# Patient Record
Sex: Female | Born: 1935 | Race: White | Hispanic: No | Smoking: Former smoker
Health system: Southern US, Community
[De-identification: ages and names within clinical notes are randomized; demographics above are authoritative.]

## PROBLEM LIST (undated history)

## (undated) DIAGNOSIS — J449 Chronic obstructive pulmonary disease, unspecified: Secondary | ICD-10-CM

## (undated) DIAGNOSIS — H409 Unspecified glaucoma: Secondary | ICD-10-CM

## (undated) DIAGNOSIS — R002 Palpitations: Secondary | ICD-10-CM

## (undated) DIAGNOSIS — K629 Disease of anus and rectum, unspecified: Secondary | ICD-10-CM

## (undated) DIAGNOSIS — I10 Essential (primary) hypertension: Secondary | ICD-10-CM

## (undated) DIAGNOSIS — N3941 Urge incontinence: Secondary | ICD-10-CM

## (undated) DIAGNOSIS — E039 Hypothyroidism, unspecified: Secondary | ICD-10-CM

## (undated) DIAGNOSIS — R7303 Prediabetes: Secondary | ICD-10-CM

## (undated) DIAGNOSIS — R0902 Hypoxemia: Secondary | ICD-10-CM

## (undated) HISTORY — DX: Essential (primary) hypertension: I10

## (undated) HISTORY — DX: Prediabetes: R73.03

## (undated) HISTORY — DX: Hypoxemia: R09.02

## (undated) HISTORY — PX: ABDOMINAL HYSTERECTOMY: SHX81

## (undated) HISTORY — DX: Disease of anus and rectum, unspecified: K62.9

## (undated) HISTORY — DX: Palpitations: R00.2

## (undated) HISTORY — DX: Urge incontinence: N39.41

---

## 2011-04-11 HISTORY — PX: COLONOSCOPY: SHX174

## 2015-02-18 DIAGNOSIS — H409 Unspecified glaucoma: Secondary | ICD-10-CM | POA: Insufficient documentation

## 2015-02-19 DIAGNOSIS — R7303 Prediabetes: Secondary | ICD-10-CM

## 2015-02-19 HISTORY — DX: Prediabetes: R73.03

## 2015-02-22 DIAGNOSIS — F17200 Nicotine dependence, unspecified, uncomplicated: Secondary | ICD-10-CM | POA: Insufficient documentation

## 2015-02-22 DIAGNOSIS — Z2821 Immunization not carried out because of patient refusal: Secondary | ICD-10-CM | POA: Insufficient documentation

## 2015-10-06 DIAGNOSIS — Z7189 Other specified counseling: Secondary | ICD-10-CM | POA: Insufficient documentation

## 2015-10-06 DIAGNOSIS — Z13820 Encounter for screening for osteoporosis: Secondary | ICD-10-CM | POA: Insufficient documentation

## 2016-02-07 ENCOUNTER — Inpatient Hospital Stay
Admission: EM | Admit: 2016-02-07 | Discharge: 2016-02-08 | DRG: 192 | Disposition: A | Discharge status: Home Health | Payer: Medicare HMO | Attending: Internal Medicine | Admitting: Internal Medicine

## 2016-02-07 ENCOUNTER — Emergency Department: Payer: Medicare HMO

## 2016-02-07 ENCOUNTER — Encounter: Payer: Self-pay | Admitting: Emergency Medicine

## 2016-02-07 DIAGNOSIS — M6281 Muscle weakness (generalized): Secondary | ICD-10-CM

## 2016-02-07 DIAGNOSIS — Z7982 Long term (current) use of aspirin: Secondary | ICD-10-CM

## 2016-02-07 DIAGNOSIS — J441 Chronic obstructive pulmonary disease with (acute) exacerbation: Secondary | ICD-10-CM | POA: Diagnosis not present

## 2016-02-07 DIAGNOSIS — Z87891 Personal history of nicotine dependence: Secondary | ICD-10-CM

## 2016-02-07 DIAGNOSIS — Z79899 Other long term (current) drug therapy: Secondary | ICD-10-CM | POA: Diagnosis not present

## 2016-02-07 DIAGNOSIS — R0902 Hypoxemia: Secondary | ICD-10-CM | POA: Diagnosis present

## 2016-02-07 DIAGNOSIS — R2681 Unsteadiness on feet: Secondary | ICD-10-CM

## 2016-02-07 DIAGNOSIS — I10 Essential (primary) hypertension: Secondary | ICD-10-CM | POA: Diagnosis present

## 2016-02-07 DIAGNOSIS — H409 Unspecified glaucoma: Secondary | ICD-10-CM | POA: Diagnosis present

## 2016-02-07 DIAGNOSIS — E039 Hypothyroidism, unspecified: Secondary | ICD-10-CM | POA: Diagnosis present

## 2016-02-07 HISTORY — DX: Unspecified glaucoma: H40.9

## 2016-02-07 HISTORY — DX: Hypothyroidism, unspecified: E03.9

## 2016-02-07 HISTORY — DX: Essential (primary) hypertension: I10

## 2016-02-07 HISTORY — DX: Chronic obstructive pulmonary disease, unspecified: J44.9

## 2016-02-07 LAB — CBC WITH DIFFERENTIAL/PLATELET
BASOS ABS: 0 10*3/uL (ref 0–0.1)
BASOS PCT: 0 %
EOS ABS: 0 10*3/uL (ref 0–0.7)
EOS PCT: 0 %
HCT: 41.8 % (ref 35.0–47.0)
Hemoglobin: 13.8 g/dL (ref 12.0–16.0)
LYMPHS PCT: 5 %
Lymphs Abs: 0.9 10*3/uL — ABNORMAL LOW (ref 1.0–3.6)
MCH: 31.9 pg (ref 26.0–34.0)
MCHC: 32.9 g/dL (ref 32.0–36.0)
MCV: 96.8 fL (ref 80.0–100.0)
MONO ABS: 1.6 10*3/uL — AB (ref 0.2–0.9)
Monocytes Relative: 9 %
Neutro Abs: 16.3 10*3/uL — ABNORMAL HIGH (ref 1.4–6.5)
Neutrophils Relative %: 86 %
PLATELETS: 256 10*3/uL (ref 150–440)
RBC: 4.32 MIL/uL (ref 3.80–5.20)
RDW: 12.4 % (ref 11.5–14.5)
WBC: 18.9 10*3/uL — AB (ref 3.6–11.0)

## 2016-02-07 LAB — BASIC METABOLIC PANEL
ANION GAP: 10 (ref 5–15)
BUN: 10 mg/dL (ref 6–20)
CALCIUM: 9.4 mg/dL (ref 8.9–10.3)
CO2: 31 mmol/L (ref 22–32)
Chloride: 89 mmol/L — ABNORMAL LOW (ref 101–111)
Creatinine, Ser: 0.51 mg/dL (ref 0.44–1.00)
GLUCOSE: 115 mg/dL — AB (ref 65–99)
POTASSIUM: 3.4 mmol/L — AB (ref 3.5–5.1)
SODIUM: 130 mmol/L — AB (ref 135–145)

## 2016-02-07 MED ORDER — IPRATROPIUM BROMIDE 0.02 % IN SOLN
0.5000 mg | Freq: Once | RESPIRATORY_TRACT | Status: AC
  Administered 2016-02-07: 0.5 mg via RESPIRATORY_TRACT
  Filled 2016-02-07: qty 2.5

## 2016-02-07 MED ORDER — ACETAMINOPHEN 650 MG RE SUPP: 650.0000 mg | Freq: Four times a day (QID) | RECTAL | Status: DC | PRN

## 2016-02-07 MED ORDER — DEXTROSE 5 % IV SOLN
500.0000 mg | Freq: Once | INTRAVENOUS | Status: AC
  Administered 2016-02-07: 500 mg via INTRAVENOUS
  Filled 2016-02-07: qty 500

## 2016-02-07 MED ORDER — IPRATROPIUM-ALBUTEROL 0.5-2.5 (3) MG/3ML IN SOLN
3.0000 mL | Freq: Four times a day (QID) | RESPIRATORY_TRACT | Status: DC
  Administered 2016-02-07 – 2016-02-08 (×3): 3 mL via RESPIRATORY_TRACT
  Filled 2016-02-07 (×4): qty 3

## 2016-02-07 MED ORDER — CEFTRIAXONE SODIUM-DEXTROSE 1-3.74 GM-% IV SOLR: 1.0000 g | Freq: Every day | INTRAVENOUS | Status: DC

## 2016-02-07 MED ORDER — CEFTRIAXONE SODIUM 1 G IJ SOLR: 1.0000 g | INTRAMUSCULAR | Status: DC

## 2016-02-07 MED ORDER — LOSARTAN POTASSIUM 25 MG PO TABS
50.0000 mg | ORAL_TABLET | Freq: Every day | ORAL | Status: DC
  Administered 2016-02-08: 50 mg via ORAL
  Filled 2016-02-07: qty 1
  Filled 2016-02-07: qty 2

## 2016-02-07 MED ORDER — ACETAMINOPHEN 325 MG PO TABS: 650.0000 mg | ORAL_TABLET | Freq: Four times a day (QID) | ORAL | Status: DC | PRN

## 2016-02-07 MED ORDER — ASPIRIN EC 81 MG PO TBEC
81.0000 mg | DELAYED_RELEASE_TABLET | Freq: Every day | ORAL | Status: DC
  Administered 2016-02-08: 81 mg via ORAL
  Filled 2016-02-07: qty 1

## 2016-02-07 MED ORDER — METHYLPREDNISOLONE SODIUM SUCC 125 MG IJ SOLR
60.0000 mg | Freq: Four times a day (QID) | INTRAMUSCULAR | Status: DC
  Administered 2016-02-08 (×2): 60 mg via INTRAVENOUS
  Filled 2016-02-07 (×2): qty 2

## 2016-02-07 MED ORDER — ORAL CARE MOUTH RINSE: 15.0000 mL | Freq: Two times a day (BID) | OROMUCOSAL | Status: DC

## 2016-02-07 MED ORDER — TIMOLOL MALEATE 0.5 % OP SOLN
1.0000 [drp] | Freq: Two times a day (BID) | OPHTHALMIC | Status: DC
  Administered 2016-02-08: 09:00:00 1 [drp] via OPHTHALMIC
  Filled 2016-02-07 (×2): qty 5

## 2016-02-07 MED ORDER — LEVOTHYROXINE SODIUM 50 MCG PO TABS
100.0000 ug | ORAL_TABLET | Freq: Every day | ORAL | Status: DC
  Administered 2016-02-08: 100 ug via ORAL
  Filled 2016-02-07: qty 2

## 2016-02-07 MED ORDER — BUDESONIDE 0.25 MG/2ML IN SUSP
0.2500 mg | Freq: Two times a day (BID) | RESPIRATORY_TRACT | Status: DC
  Administered 2016-02-07 – 2016-02-08 (×2): 0.25 mg via RESPIRATORY_TRACT
  Filled 2016-02-07 (×2): qty 2

## 2016-02-07 MED ORDER — ENOXAPARIN SODIUM 40 MG/0.4ML ~~LOC~~ SOLN: 30.0000 mg | SUBCUTANEOUS | Status: DC

## 2016-02-07 MED ORDER — LATANOPROST 0.005 % OP SOLN
1.0000 [drp] | Freq: Every day | OPHTHALMIC | Status: DC
  Filled 2016-02-07 (×3): qty 2.5

## 2016-02-07 MED ORDER — ALBUTEROL SULFATE (2.5 MG/3ML) 0.083% IN NEBU
5.0000 mg | INHALATION_SOLUTION | Freq: Once | RESPIRATORY_TRACT | Status: AC
  Administered 2016-02-07: 5 mg via RESPIRATORY_TRACT
  Filled 2016-02-07: qty 6

## 2016-02-07 MED ORDER — AZITHROMYCIN 250 MG PO TABS: 250.0000 mg | ORAL_TABLET | Freq: Every day | ORAL | Status: DC

## 2016-02-07 MED ORDER — CEFTRIAXONE SODIUM-DEXTROSE 1-3.74 GM-% IV SOLR: 1.0000 g | INTRAVENOUS | Status: DC

## 2016-02-07 MED ORDER — METHYLPREDNISOLONE SODIUM SUCC 125 MG IJ SOLR
125.0000 mg | Freq: Once | INTRAMUSCULAR | Status: AC
  Administered 2016-02-07: 125 mg via INTRAVENOUS
  Filled 2016-02-07: qty 2

## 2016-02-07 NOTE — ED Triage Notes (Signed)
C/O one week history of productive cough and fever.  Seen by Dr. Valora Piccolo who recommended her to be seen through the ED for evaluation and possible admission.

## 2016-02-07 NOTE — Progress Notes (Signed)
Pt has an order for lovenox '40mg'$  q 24hr. Pt is a female whose weight is <47kg. Per protocol, will reduce dose to '30mg'$  daily.  Ramond Dial, Pharm.D Clinical Pharmacist

## 2016-02-07 NOTE — ED Provider Notes (Addendum)
[facility] Emergency Department Provider Note  ____________________________________________   I have reviewed the triage vital signs and the nursing notes.   HISTORY  Chief Complaint Cough and Fever    HPI Jane Davis is a 82 y.o. female chronic smoker, history of COPD, with home albuterol not on home oxygen presents today with cough and wheeze for a few days. She states that she has had a subjective fever at home. She denies nausea or vomiting or chest pain. She is getting short of breath walking around.    Past Medical History:  Diagnosis Date  . COPD (chronic obstructive pulmonary disease) (HCC)     There are no active problems to display for this patient.   Past Surgical History:  Procedure Laterality Date  . ABDOMINAL HYSTERECTOMY      Prior to Admission medications   Medication Sig Start Date End Date Taking? Authorizing Provider  albuterol (ACCUNEB) 1.25 MG/3ML nebulizer solution Take 1.25 mg by nebulization every 6 (six) hours as needed. 02/22/15  Yes Historical Provider, MD  aspirin EC 81 MG tablet Take 81 mg by mouth daily.   Yes Historical Provider, MD  latanoprost (XALATAN) 0.005 % ophthalmic solution Place 1 drop into both eyes at bedtime.    Yes Historical Provider, MD  levothyroxine (SYNTHROID, LEVOTHROID) 100 MCG tablet Take 1 tablet by mouth daily. 06/21/15 06/20/16 Yes Historical Provider, MD  losartan (COZAAR) 50 MG tablet Take 50 mg by mouth daily. 03/08/15 03/07/16 Yes Historical Provider, MD  Timolol Maleate 0.5 % (DAILY) SOLN Place 1 drop into the right eye daily.   Yes Historical Provider, MD    Allergies Review of patient's allergies indicates no known allergies.  No family history on file.  Social History Social History  Substance Use Topics  . Smoking status: Former Research scientist (life sciences)  . Smokeless tobacco: Never Used  . Alcohol use No    Review of Systems Constitutional: No fever/chills Eyes: No visual changes. ENT: No  sore throat. No stiff neck no neck pain Cardiovascular: Denies chest pain. Respiratory: Positive shortness of breath. Gastrointestinal:   no vomiting.  No diarrhea.  No constipation. Genitourinary: Negative for dysuria. Musculoskeletal: Negative lower extremity swelling Skin: Negative for rash. Neurological: Negative for severe headaches, focal weakness or numbness. 10-point ROS otherwise negative.  ____________________________________________   PHYSICAL EXAM:  VITAL SIGNS: ED Triage Vitals  Enc Vitals Group     BP 02/07/16 1139 (!) 139/59     Pulse Rate 02/07/16 1139 92     Resp 02/07/16 1139 15     Temp 02/07/16 1139 98 F (36.7 C)     Temp Source 02/07/16 1139 Oral     SpO2 02/07/16 1139 96 %     Weight 02/07/16 1139 92 lb (41.7 kg)     Height 02/07/16 1139 5' (1.524 m)     Head Circumference --      Peak Flow --      Pain Score 02/07/16 1146 0     Pain Loc --      Pain Edu? --      Excl. in [place]? --     Constitutional: Alert and oriented. Well appearing and in no acute distress. Eyes: Conjunctivae are normal. PERRL. EOMI. Head: Atraumatic. Nose: No congestion/rhinnorhea. Mouth/Throat: Mucous membranes are moist.  Oropharynx non-erythematous. Neck: No stridor.   Nontender with no meningismus Cardiovascular: Normal rate, regular rhythm. Grossly normal heart sounds.  Good peripheral circulation. Respiratory: Diffuse rhonchi clear mostly with cough but still persist wheeze  noted as well diminished in the bases Abdominal: Soft and nontender. No distention. No guarding no rebound Back:  There is no focal tenderness or step off.  there is no midline tenderness there are no lesions noted. there is no CVA tenderness Musculoskeletal: No lower extremity tenderness, no upper extremity tenderness. No joint effusions, no DVT signs strong distal pulses no edema Neurologic:  Normal speech and language. No gross focal neurologic deficits are appreciated.  Skin:  Skin is warm, dry and  intact. No rash noted. Psychiatric: Mood and affect are normal. Speech and behavior are normal.  ____________________________________________   LABS (all labs ordered are listed, but only abnormal results are displayed)  Labs Reviewed  CBC WITH DIFFERENTIAL/PLATELET - Abnormal; Notable for the following:       Result Value   WBC 18.9 (*)    Neutro Abs 16.3 (*)    Lymphs Abs 0.9 (*)    Monocytes Absolute 1.6 (*)    All other components within normal limits  BASIC METABOLIC PANEL - Abnormal; Notable for the following:    Sodium 130 (*)    Potassium 3.4 (*)    Chloride 89 (*)    Glucose, Bld 115 (*)    All other components within normal limits   ____________________________________________  EKG  I personally interpreted any EKGs ordered by me or triage Normal sinus rhythm rate 90 bpm no acute ST elevation or depression normal axis rsr' configuration ____________________________________________  RADIOLOGY  I reviewed any imaging ordered by me or triage that were performed during my shift and, if possible, patient and/or family made aware of any abnormal findings. ____________________________________________   PROCEDURES  Procedure(s) performed: None  Procedures  Critical Care performed: None  ____________________________________________   INITIAL IMPRESSION / ASSESSMENT AND PLAN / ED COURSE  Pertinent labs & imaging results that were available during my care of the patient were reviewed by me and considered in my medical decision making (see chart for details).  Patient with COPD presents with cough and wheeze. White count is elevated. On room air, she does get down to 86% in the room even after nebulizer treatment. We will start her on oxygen here, patient will require admission. No clear evidence of pneumonia but we did give her empiric antibiotics hoping to get her home, we will admit to the hospice or so because of her hypoxia.  Clinical Course    ____________________________________________   FINAL CLINICAL IMPRESSION(S) / ED DIAGNOSES  Final diagnoses:  None      This chart was dictated using voice recognition software.  Despite best efforts to proofread,  errors can occur which can change meaning.      Schuyler Amor, MD 02/07/16 1722    Schuyler Amor, MD 02/07/16 [phone]

## 2016-02-07 NOTE — ED Notes (Signed)
Pt presents with cough x 5 days. Pt states she is coughing up green phlegm most of that time. Denies pain. Pt coughing during assessment. NAD Noted.

## 2016-02-07 NOTE — ED Notes (Signed)
Pt placed on 2L [place] due to 02 of 87%. MD made aware and 02 up to 95% on 2L

## 2016-02-07 NOTE — H&P (Signed)
Man at [place] NAME: Jane Davis    MR#:  [id number]  DATE OF BIRTH:  [birth year]  DATE OF ADMISSION:  02/07/2016  PRIMARY CARE PHYSICIAN: Lilian Coma., MD   REQUESTING/REFERRING PHYSICIAN: Dr Charlotte Crumb  CHIEF COMPLAINT:   Chief Complaint  Patient presents with  . Cough  . Fever    HISTORY OF PRESENT ILLNESS:  Jane Davis  is a 82 y.o. female sent in for difficulty breathing and cough. Patient states that she feels better once the oxygen was placed. She's been feeling really fatigued. She's been coughing up green and yellow sputum. In the ER, she was found to have a pulse ox of 87% on room air and was placed on oxygen. Hospitalist services were contacted for further evaluation.  PAST MEDICAL HISTORY:   Past Medical History:  Diagnosis Date  . COPD (chronic obstructive pulmonary disease) ([place])   . Glaucoma   . Hypertension   . Hypothyroidism     PAST SURGICAL HISTORY:   Past Surgical History:  Procedure Laterality Date  . ABDOMINAL HYSTERECTOMY      SOCIAL HISTORY:   Social History  Substance Use Topics  . Smoking status: Former Research scientist (life sciences)  . Smokeless tobacco: Never Used  . Alcohol use No    FAMILY HISTORY:   Family History  Problem Relation Age of Onset  . Intracerebral hemorrhage Mother     DRUG ALLERGIES:  No Known Allergies  REVIEW OF SYSTEMS:  CONSTITUTIONAL: Positive for slight fever, positive for chills. Positive for fatigue. Positive for weight loss EYES: No blurred or double vision. Wears glasses.  EARS, NOSE, AND THROAT: No tinnitus or ear pain. No sore throat RESPIRATORY: Positive for cough with green-yellow phlegm. Positive for shortness of breath and wheezing. No hemoptysis.  CARDIOVASCULAR: No chest pain, orthopnea, edema.  GASTROINTESTINAL: No nausea, vomiting, diarrhea or abdominal pain. No blood in bowel movements GENITOURINARY: No dysuria, hematuria.  ENDOCRINE: No  polyuria, nocturia,  HEMATOLOGY: No anemia, easy bruising or bleeding SKIN: No rash or lesion. MUSCULOSKELETAL: Positive for joint pains  NEUROLOGIC: No tingling, numbness, weakness.  PSYCHIATRY: No anxiety or depression.   MEDICATIONS AT HOME:   Prior to Admission medications   Medication Sig Start Date End Date Taking? Authorizing Provider  albuterol (ACCUNEB) 1.25 MG/3ML nebulizer solution Take 1.25 mg by nebulization every 6 (six) hours as needed. 02/22/15  Yes Historical Provider, MD  aspirin EC 81 MG tablet Take 81 mg by mouth daily.   Yes Historical Provider, MD  latanoprost (XALATAN) 0.005 % ophthalmic solution Place 1 drop into both eyes at bedtime.    Yes Historical Provider, MD  levothyroxine (SYNTHROID, LEVOTHROID) 100 MCG tablet Take 1 tablet by mouth daily. 06/21/15 06/20/16 Yes Historical Provider, MD  losartan (COZAAR) 50 MG tablet Take 50 mg by mouth daily. 03/08/15 03/07/16 Yes Historical Provider, MD  Timolol Maleate 0.5 % (DAILY) SOLN Place 1 drop into the right eye daily.   Yes Historical Provider, MD      VITAL SIGNS:  Blood pressure (!) 127/52, pulse 99, temperature 98 F (36.7 C), temperature source Oral, resp. rate (!) 26, height 5' (1.524 m), weight 41.7 kg (92 lb), SpO2 95 %.  PHYSICAL EXAMINATION:  GENERAL:  82 y.o.-year-old patient lying in the bed with no acute distress.  EYES: Pupils equal, round, reactive to light and accommodation. No scleral icterus. Extraocular muscles intact.  HEENT: Head atraumatic, normocephalic. Oropharynx and nasopharynx clear.  NECK:  Supple, no jugular  venous distention. No thyroid enlargement, no tenderness.  LUNGS: Decreased breath sounds bilaterally, positive expiratory upper airway wheezing. Positive for rhonchi right base. No use of accessory muscles of respiration.  CARDIOVASCULAR: S1, S2 normal. 2/6 systolic murmurs. No rubs, or gallops.  ABDOMEN: Soft, nontender, nondistended. Bowel sounds present. No organomegaly or  mass.  EXTREMITIES: No pedal edema, cyanosis, or clubbing.  NEUROLOGIC: Cranial nerves II through XII are intact. Muscle strength 5/5 in all extremities. Sensation intact. Gait not checked.  PSYCHIATRIC: The patient is alert and oriented x 3.  SKIN: No rash, lesion, or ulcer.   LABORATORY PANEL:   CBC  Recent Labs Lab 02/07/16 1152  WBC 18.9*  HGB 13.8  HCT 41.8  PLT 256   ------------------------------------------------------------------------------------------------------------------  Chemistries   Recent Labs Lab 02/07/16 1152  NA 130*  K 3.4*  CL 89*  CO2 31  GLUCOSE 115*  BUN 10  CREATININE 0.51  CALCIUM 9.4   ------------------------------------------------------------------------------------------------------------------    RADIOLOGY:  Dg Chest 2 View  Result Date: 02/07/2016 CLINICAL DATA:  Productive cough and fever EXAM: CHEST  2 VIEW COMPARISON:  None. FINDINGS: Cardiac shadow is within normal limits. The lungs are hyperaerated consistent with COPD. Biapical scarring is seen. Scattered calcified granulomas are noted bilaterally. Degenerative change of the thoracic spine is seen. No focal confluent infiltrate is noted. IMPRESSION: Chronic changes as described Electronically Signed   By: Inez Catalina M.D.   On: 02/07/2016 12:19    EKG:   Sinus rhythm 98 bpm right axis deviation  IMPRESSION AND PLAN:   1. COPD exacerbation. Start nebulizer treatments with budesonide and DuoNeb. Antibiotics with Rocephin and Zithromax. Start systemic steroids with Solu-Medrol 60 mg every 6 hours. Initial chest x-ray was read as negative but I do suspect pneumonia. Sputum culture. 2. Acute respiratory failure with hypoxia. Oxygen supplementation. 3. Essential hypertension continue losartan 4. Hypothyroidism unspecified continue Synthroid 5. Glaucoma unspecified continue eyedrops    All the records are reviewed and case discussed with ED provider. Management plans  discussed with the patient, and she is in agreement.  CODE STATUS: Full code  TOTAL TIME TAKING CARE OF THIS PATIENT: 50 minutes.    Loletha Grayer M.D on 02/07/2016 at 6:14 PM  Between 7am to 6pm - Pager - [phone]  After 6pm call admission pager [phone]  [facility]  [phone]  CC: Primary care physician; Lilian Coma., MD

## 2016-02-08 LAB — BASIC METABOLIC PANEL
ANION GAP: 9 (ref 5–15)
BUN: 15 mg/dL (ref 6–20)
CALCIUM: 9 mg/dL (ref 8.9–10.3)
CHLORIDE: 94 mmol/L — AB (ref 101–111)
CO2: 31 mmol/L (ref 22–32)
Creatinine, Ser: 0.55 mg/dL (ref 0.44–1.00)
GFR calc Af Amer: >60 mL/min (ref >60)
GFR calc non Af Amer: >60 mL/min (ref >60)
GLUCOSE: 233 mg/dL — AB (ref 65–99)
Potassium: 3.8 mmol/L (ref 3.5–5.1)
Sodium: 134 mmol/L — ABNORMAL LOW (ref 135–145)

## 2016-02-08 LAB — CBC
HEMATOCRIT: 39.3 % (ref 35.0–47.0)
HEMOGLOBIN: 13.2 g/dL (ref 12.0–16.0)
MCH: 32.2 pg (ref 26.0–34.0)
MCHC: 33.5 g/dL (ref 32.0–36.0)
MCV: 96 fL (ref 80.0–100.0)
Platelets: 243 10*3/uL (ref 150–440)
RBC: 4.09 MIL/uL (ref 3.80–5.20)
RDW: 12.1 % (ref 11.5–14.5)
WBC: 14.8 10*3/uL — ABNORMAL HIGH (ref 3.6–11.0)

## 2016-02-08 MED ORDER — LEVOFLOXACIN 500 MG PO TABS: 250.0000 mg | ORAL_TABLET | Freq: Every day | ORAL | Status: DC

## 2016-02-08 MED ORDER — AZITHROMYCIN 250 MG PO TABS: ORAL_TABLET | ORAL | 0 refills | Status: DC

## 2016-02-08 MED ORDER — PREDNISONE 50 MG PO TABS: 50.0000 mg | ORAL_TABLET | Freq: Every day | ORAL | Status: DC

## 2016-02-08 MED ORDER — LEVOFLOXACIN 500 MG PO TABS: 500.0000 mg | ORAL_TABLET | Freq: Every day | ORAL | Status: DC

## 2016-02-08 MED ORDER — LEVOFLOXACIN 250 MG PO TABS: 250.0000 mg | ORAL_TABLET | Freq: Every day | ORAL | 0 refills | Status: DC

## 2016-02-08 MED ORDER — METHYLPREDNISOLONE SODIUM SUCC 125 MG IJ SOLR: 60.0000 mg | Freq: Every day | INTRAMUSCULAR | Status: DC

## 2016-02-08 MED ORDER — LEVOFLOXACIN 500 MG PO TABS
500.0000 mg | ORAL_TABLET | Freq: Once | ORAL | Status: AC
  Administered 2016-02-08: 16:00:00 500 mg via ORAL
  Filled 2016-02-08: qty 1

## 2016-02-08 MED ORDER — LEVOFLOXACIN 500 MG PO TABS: 500.0000 mg | ORAL_TABLET | Freq: Every day | ORAL | 0 refills | Status: DC

## 2016-02-08 MED ORDER — PREDNISONE 10 MG PO TABS: ORAL_TABLET | ORAL | 0 refills | Status: DC

## 2016-02-08 NOTE — Evaluation (Signed)
Physical Therapy Evaluation Patient Details Name: Jane Davis MRN: [id number] DOB: 08-14-1933 Today's Date: 02/08/2016   History of Present Illness  Pt admitted for complaints of cough and fever. Pt with history of COPD, glaucoma, and HTN. Pt now admitted for COPD exacerbation.  Clinical Impression  Pt is a pleasant 82 year old female who was admitted for COPD exacerbation. Pt demonstrates all bed mobility/transfers/ambulation at baseline level. Pt performed all mobility on RA, however sats decreasing to 87%. No SOB symptoms noted. 2L of O2 reapplied with sats improving to 92%. CM notified. Pt encouraged to continue to be active to improve endurance. Encouraged to continue ambulation with RN staff. Pt does not require any further PT needs at this time. Pt will be dc in house and does not require follow up. RN aware. Will dc current orders.      Follow Up Recommendations No PT follow up    Equipment Recommendations  None recommended by PT    Recommendations for Other Services       Precautions / Restrictions Precautions Precautions: Fall Restrictions Weight Bearing Restrictions: No      Mobility  Bed Mobility Overal bed mobility: Independent             General bed mobility comments: safe technique performed, no assistance needed  Transfers Overall transfer level: Independent Equipment used: None             General transfer comment: safe technique performed  Ambulation/Gait Ambulation/Gait assistance: Supervision Ambulation Distance (Feet): 20 Feet Assistive device: None       General Gait Details: slight unsteadiness noted initially, however improves with increased ambulation.  Small shuffling gait pattern noted with 1 reaching for wall. Further ambulation peformed in ther-ex  Stairs            Wheelchair Mobility    Modified Rankin (Stroke Patients Only)       Balance Overall balance assessment: Independent                                            Pertinent Vitals/Pain Pain Assessment: No/denies pain    Home Living Family/patient expects to be discharged to:: Private residence Living Arrangements: Children Available Help at Discharge: Family;Available PRN/intermittently Type of Home: House Home Access: Level entry     Home Layout: One level Home Equipment: None      Prior Function Level of Independence: Independent         Comments: community ambulator     Journalist, newspaper        Extremity/Trunk Assessment   Upper Extremity Assessment: Overall WFL for tasks assessed           Lower Extremity Assessment: Overall WFL for tasks assessed         Communication   Communication: No difficulties  Cognition Arousal/Alertness: Awake/alert Behavior During Therapy: WFL for tasks assessed/performed Overall Cognitive Status: Within Functional Limits for tasks assessed                      General Comments      Exercises Other Exercises Other Exercises: Further ambulation performed for 170' with no AD and supervision. Safe technique performed with reciprocal gait pattern and no AD. No fatigue noted. No LOB noted   Assessment/Plan    PT Assessment Patent does not need any further PT services  PT Problem List  PT Treatment Interventions      PT Goals (Current goals can be found in the Care Plan section)  Acute Rehab PT Goals Patient Stated Goal: to go home PT Goal Formulation: All assessment and education complete, DC therapy Time For Goal Achievement: 03/02/16 Potential to Achieve Goals: Good    Frequency     Barriers to discharge        Co-evaluation               End of Session Equipment Utilized During Treatment: Gait belt Activity Tolerance: Patient tolerated treatment well Patient left: in bed (sitting at EOB, pt is mod fall risk) Nurse Communication: Mobility status         Time: 1016-1030 PT Time Calculation (min) (ACUTE  ONLY): 14 min   Charges:   PT Evaluation $PT Eval Low Complexity: 1 Procedure PT Treatments $Gait Training: 8-22 mins   PT G Codes:        Mayerly Kaman 03/02/2016, 12:03 PM Greggory Stallion, PT, DPT [phone]

## 2016-02-08 NOTE — Progress Notes (Signed)
SATURATION QUALIFICATIONS: (This note is used to comply with regulatory documentation for home oxygen)  Patient Saturations on Room Air at Rest = 90%  Patient Saturations on Room Air while Ambulating =87%  Patient Saturations on 2L Liters of oxygen while Ambulating = 92%  Please briefly explain why patient needs home oxygen: Chronic COPD and COPD exacerbation

## 2016-02-08 NOTE — Discharge Instructions (Signed)
Use your oxygen as instructed. °

## 2016-02-08 NOTE — Discharge Summary (Signed)
Jane Davis at [place] NAME: Jane Davis    MR#:  [id number]  DATE OF BIRTH:  [birth year]  DATE OF ADMISSION:  02/07/2016 ADMITTING PHYSICIAN: Loletha Grayer, MD  DATE OF DISCHARGE: 02/08/16  PRIMARY CARE PHYSICIAN: Lilian Coma., MD    ADMISSION DIAGNOSIS:  Chronic obstructive pulmonary disease with acute exacerbation (HCC) [J44.1]  DISCHARGE DIAGNOSIS:  Acute on chronic respiratory failure now needing oxygen HTN  SECONDARY DIAGNOSIS:   Past Medical History:  Diagnosis Date  . COPD (chronic obstructive pulmonary disease) ([place])   . Glaucoma   . Hypertension   . Hypothyroidism     HOSPITAL COURSE:  Jane Davis  is a 82 y.o. female sent in for difficulty breathing and cough. Patient states that she feels better once the oxygen was placed. She's been feeling really fatigued. She's been coughing up green and yellow sputum. In the ER, she was found to have a pulse ox of 87% on room air and was placed on oxygen.   1. COPD exacerbation. -on  nebulizer treatments with budesonide and DuoNeb. Antibiotics with  levaquin -switch to po steroids   Initial chest x-ray was read as negative  Wbc down to 14 k Pt says she is fine going home. Feels better than yday  2. Acute respiratory failure with hypoxia. Oxygen supplementation. -set up home oxygen  3. Essential hypertension continue losartan  4. Hypothyroidism unspecified continue Synthroid  5. Glaucoma unspecified continue eyedrops   CONSULTS OBTAINED:    DRUG ALLERGIES:  No Known Allergies  DISCHARGE MEDICATIONS:   Current Discharge Medication List    START taking these medications   Details  levofloxacin (LEVAQUIN) 250 MG tablet Take 1 tablet (250 mg total) by mouth daily. Qty: 6 tablet, Refills: 0    predniSONE (DELTASONE) 10 MG tablet Start 50 mg daily---taper by 10 mg daily then stop Qty: 15 tablet, Refills: 0      CONTINUE these medications which have  NOT CHANGED   Details  albuterol (ACCUNEB) 1.25 MG/3ML nebulizer solution Take 1.25 mg by nebulization every 6 (six) hours as needed.    aspirin EC 81 MG tablet Take 81 mg by mouth daily.    latanoprost (XALATAN) 0.005 % ophthalmic solution Place 1 drop into both eyes at bedtime.     levothyroxine (SYNTHROID, LEVOTHROID) 100 MCG tablet Take 1 tablet by mouth daily.    losartan (COZAAR) 50 MG tablet Take 50 mg by mouth daily.    Timolol Maleate 0.5 % (DAILY) SOLN Place 1 drop into the right eye daily.        If you experience worsening of your admission symptoms, develop shortness of breath, life threatening emergency, suicidal or homicidal thoughts you must seek medical attention immediately by calling 911 or calling your MD immediately  if symptoms less severe.  You Must read complete instructions/literature along with all the possible adverse reactions/side effects for all the Medicines you take and that have been prescribed to you. Take any new Medicines after you have completely understood and accept all the possible adverse reactions/side effects.   Please note  You were cared for by a hospitalist during your hospital stay. If you have any questions about your discharge medications or the care you received while you were in the hospital after you are discharged, you can call the unit and asked to speak with the hospitalist on call if the hospitalist that took care of you is not available. Once you are discharged, your  primary care physician will handle any further medical issues. Please note that NO REFILLS for any discharge medications will be authorized once you are discharged, as it is imperative that you return to your primary care physician (or establish a relationship with a primary care physician if you do not have one) for your aftercare needs so that they can reassess your need for medications and monitor your lab values. Today   SUBJECTIVE   Feels better. Not much phlegm  today. Breathing improved  VITAL SIGNS:  Blood pressure (!) 120/54, pulse 78, temperature 97.6 F (36.4 C), temperature source Oral, resp. rate 20, height 5' (1.524 m), weight 44.4 kg (97 lb 12.8 oz), SpO2 98 %.  I/O:    Intake/Output Summary (Last 24 hours) at 02/08/16 1555 Last data filed at 02/08/16 1300  Gross per 24 hour  Intake             1829 ml  Output                0 ml  Net             1829 ml    PHYSICAL EXAMINATION:  GENERAL:  82 y.o.-year-old patient lying in the bed with no acute distress. cahcectic EYES: Pupils equal, round, reactive to light and accommodation. No scleral icterus. Extraocular muscles intact.  HEENT: Head atraumatic, normocephalic. Oropharynx and nasopharynx clear.  NECK:  Supple, no jugular venous distention. No thyroid enlargement, no tenderness.  LUNGS: distant breath sounds bilaterally, no wheezing, rales,rhonchi or crepitation. No use of accessory muscles of respiration.  CARDIOVASCULAR: S1, S2 normal. No murmurs, rubs, or gallops.  ABDOMEN: Soft, non-tender, non-distended. Bowel sounds present. No organomegaly or mass.  EXTREMITIES: No pedal edema, cyanosis, or clubbing.  NEUROLOGIC: Cranial nerves II through XII are intact. Muscle strength 5/5 in all extremities. Sensation intact. Gait not checked.  PSYCHIATRIC: The patient is alert and oriented x 3.  SKIN: No obvious rash, lesion, or ulcer.   DATA REVIEW:   CBC   Recent Labs Lab 02/08/16 0459  WBC 14.8*  HGB 13.2  HCT 39.3  PLT 243    Chemistries   Recent Labs Lab 02/08/16 0459  NA 134*  K 3.8  CL 94*  CO2 31  GLUCOSE 233*  BUN 15  CREATININE 0.55  CALCIUM 9.0    Microbiology Results   No results found for this or any previous visit (from the past 240 hour(s)).  RADIOLOGY:  Dg Chest 2 View  Result Date: 02/07/2016 CLINICAL DATA:  Productive cough and fever EXAM: CHEST  2 VIEW COMPARISON:  None. FINDINGS: Cardiac shadow is within normal limits. The lungs are  hyperaerated consistent with COPD. Biapical scarring is seen. Scattered calcified granulomas are noted bilaterally. Degenerative change of the thoracic spine is seen. No focal confluent infiltrate is noted. IMPRESSION: Chronic changes as described Electronically Signed   By: Inez Catalina M.D.   On: 02/07/2016 12:19     Management plans discussed with the patient, family and they are in agreement.  CODE STATUS:     Code Status Orders        Start     Ordered   02/07/16 1809  Full code  Continuous     02/07/16 1809    Code Status History    Date Active Date Inactive Code Status Order ID Comments User Context   This patient has a current code status but no historical code status.      TOTAL TIME TAKING  CARE OF THIS PATIENT: 40 minutes.    Aubrey Voong M.D on 02/08/2016 at 3:55 PM  Between 7am to 6pm - Pager - [phone] After 6pm go to [email or web address] - password [email or web address] [facility]  [place] [facility]  Office  [phone]  CC: Primary care physician; Lilian Coma., MD

## 2016-02-08 NOTE — Progress Notes (Signed)
Inpatient Diabetes Program Recommendations  AACE/ADA: New Consensus Statement on Inpatient Glycemic Control (2015)  Target Ranges:  Prepandial:   less than 140 mg/dL      Peak postprandial:   less than 180 mg/dL (1-2 hours)      Critically ill patients:  140 - 180 mg/dL   No results found for: GLUCAP, HGBA1C  Review of Glycemic Control- lab glucose  Results for Jane Davis, Jane Davis (MRN [id number]) as of 02/08/2016 10:14  Ref. Range 02/07/2016 11:52 02/07/2016 12:15 02/08/2016 04:59  Glucose Latest Ref Range: 65 - 99 mg/dL 115 (H)  233 (H)    Diabetes history: None noted Outpatient Diabetes medications: none Current orders for Inpatient glycemic control: none - getting steroids q6h  Inpatient Diabetes Program Recommendations:    Per ADA recommendations "consider performing an A1C on all patients with diabetes or hyperglycemia admitted to the hospital if not performed in the prior 3 months".  Likely steroid induced hyperglycemia.  Please consider ordering CBG tid and hs and Novolog sensitive correction scale tid and Novolog 0-5 units qhs   Gentry Fitz, RN, [place], [place], CDE Diabetes Coordinator Inpatient Diabetes Program  [phone] (Team Pager) [phone] ([place]) 02/08/2016 10:16 AM

## 2016-02-08 NOTE — Care Management (Addendum)
Admitted to [facility] with the diagnosis of COPD. Lives with son, Jane Davis, [phone]).  Dr. Valora Piccolo is listed as primary care physician. No home Health. No skilled facility. No home oxygen. Uses no aids for ambulation. Takes care of all basic and instrumental activities of daily living herself, drives. Prescriptions are filled at [facility]. Appetite good today. No falls in a long time. Nebulizer in the home. Son will transport. Qualified for home oxygen. Jane Davis, [facility] representative updated. Discharge to home today per Dr. Posey Pronto. Jane Ammons RN MSN CCM Care Management [phone]

## 2016-02-08 NOTE — Progress Notes (Signed)
Received MD order to discharge patient to home, reviewed discharge instructions prescriptions and home medications with patient and patient verbalized understanding discharged to home on oxygen with son

## 2016-02-08 NOTE — Care Management Important Message (Signed)
Important Message  Patient Details  Name: Jane Davis MRN: [id number] Date of Birth: 02/03/1934   Medicare Important Message Given:  Yes    Shelbie Ammons, RN 02/08/2016, 8:23 AM

## 2016-02-08 NOTE — Discharge Summary (Signed)
[place] [facility] [place] NAME: Jaleyah Longhi    MR#:  [id number]  DATE OF BIRTH:  [birth year]  DATE OF ADMISSION:  02/07/2016 ADMITTING PHYSICIAN: Loletha Grayer, MD  DATE OF DISCHARGE: 02/08/16  PRIMARY CARE PHYSICIAN: Lilian Coma., MD    ADMISSION DIAGNOSIS:  Chronic obstructive pulmonary disease with acute exacerbation (HCC) [J44.1]  DISCHARGE DIAGNOSIS:  Acute on chronic respiratory failure now needing oxygen HTN  SECONDARY DIAGNOSIS:   Past Medical History:  Diagnosis Date  . COPD (chronic obstructive pulmonary disease) ([place])   . Glaucoma   . Hypertension   . Hypothyroidism     HOSPITAL COURSE:  Charlot Gouin  is a 82 y.o. female sent in for difficulty breathing and cough. Patient states that she feels better once the oxygen was placed. She's been feeling really fatigued. She's been coughing up green and yellow sputum. In the ER, she was found to have a pulse ox of 87% on room air and was placed on oxygen.   1. COPD exacerbation. -on  nebulizer treatments with budesonide and DuoNeb. Antibiotics with  levaquin -switch to po steroids   Initial chest x-ray was read as negative  Wbc down to 14 k Pt says she is fine going home. Feels better than yday  2. Acute respiratory failure with hypoxia. Oxygen supplementation. -set up home oxygen  3. Essential hypertension continue losartan  4. Hypothyroidism unspecified continue Synthroid  5. Glaucoma unspecified continue eyedrops   CONSULTS OBTAINED:    DRUG ALLERGIES:  No Known Allergies  DISCHARGE MEDICATIONS:   Current Discharge Medication List    START taking these medications   Details  levofloxacin (LEVAQUIN) 500 MG tablet Take 1 tablet (500 mg total) by mouth daily. Qty: 6 tablet, Refills: 0    predniSONE (DELTASONE) 10 MG tablet Start 50 mg daily---taper by 10 mg daily then stop Qty: 15 tablet, Refills: 0      CONTINUE these medications which have  NOT CHANGED   Details  albuterol (ACCUNEB) 1.25 MG/3ML nebulizer solution Take 1.25 mg by nebulization every 6 (six) hours as needed.    aspirin EC 81 MG tablet Take 81 mg by mouth daily.    latanoprost (XALATAN) 0.005 % ophthalmic solution Place 1 drop into both eyes at bedtime.     levothyroxine (SYNTHROID, LEVOTHROID) 100 MCG tablet Take 1 tablet by mouth daily.    losartan (COZAAR) 50 MG tablet Take 50 mg by mouth daily.    Timolol Maleate 0.5 % (DAILY) SOLN Place 1 drop into the right eye daily.        If you experience worsening of your admission symptoms, develop shortness of breath, life threatening emergency, suicidal or homicidal thoughts you must seek medical attention immediately by calling 911 or calling your MD immediately  if symptoms less severe.  You Must read complete instructions/literature along with all the possible adverse reactions/side effects for all the Medicines you take and that have been prescribed to you. Take any new Medicines after you have completely understood and accept all the possible adverse reactions/side effects.   Please note  You were cared for by a hospitalist during your hospital stay. If you have any questions about your discharge medications or the care you received while you were in the hospital after you are discharged, you can call the unit and asked to speak with the hospitalist on call if the hospitalist that took care of you is not available. Once you are discharged, your  primary care physician will handle any further medical issues. Please note that NO REFILLS for any discharge medications will be authorized once you are discharged, as it is imperative that you return to your primary care physician (or establish a relationship with a primary care physician if you do not have one) for your aftercare needs so that they can reassess your need for medications and monitor your lab values. Today   SUBJECTIVE   Feels better. Not much phlegm  today. Breathing improved  VITAL SIGNS:  Blood pressure (!) 118/52, pulse 83, temperature 97.7 F (36.5 C), resp. rate 19, height 5' (1.524 m), weight 44.4 kg (97 lb 12.8 oz), SpO2 96 %.  I/O:    Intake/Output Summary (Last 24 hours) at 02/08/16 1243 Last data filed at 02/07/16 1939  Gross per 24 hour  Intake             1709 ml  Output                0 ml  Net             1709 ml    PHYSICAL EXAMINATION:  GENERAL:  82 y.o.-year-old patient lying in the bed with no acute distress. cahcectic EYES: Pupils equal, round, reactive to light and accommodation. No scleral icterus. Extraocular muscles intact.  HEENT: Head atraumatic, normocephalic. Oropharynx and nasopharynx clear.  NECK:  Supple, no jugular venous distention. No thyroid enlargement, no tenderness.  LUNGS: distant breath sounds bilaterally, no wheezing, rales,rhonchi or crepitation. No use of accessory muscles of respiration.  CARDIOVASCULAR: S1, S2 normal. No murmurs, rubs, or gallops.  ABDOMEN: Soft, non-tender, non-distended. Bowel sounds present. No organomegaly or mass.  EXTREMITIES: No pedal edema, cyanosis, or clubbing.  NEUROLOGIC: Cranial nerves II through XII are intact. Muscle strength 5/5 in all extremities. Sensation intact. Gait not checked.  PSYCHIATRIC: The patient is alert and oriented x 3.  SKIN: No obvious rash, lesion, or ulcer.   DATA REVIEW:   CBC   Recent Labs Lab 02/08/16 0459  WBC 14.8*  HGB 13.2  HCT 39.3  PLT 243    Chemistries   Recent Labs Lab 02/08/16 0459  NA 134*  K 3.8  CL 94*  CO2 31  GLUCOSE 233*  BUN 15  CREATININE 0.55  CALCIUM 9.0    Microbiology Results   No results found for this or any previous visit (from the past 240 hour(s)).  RADIOLOGY:  Dg Chest 2 View  Result Date: 02/07/2016 CLINICAL DATA:  Productive cough and fever EXAM: CHEST  2 VIEW COMPARISON:  None. FINDINGS: Cardiac shadow is within normal limits. The lungs are hyperaerated consistent with  COPD. Biapical scarring is seen. Scattered calcified granulomas are noted bilaterally. Degenerative change of the thoracic spine is seen. No focal confluent infiltrate is noted. IMPRESSION: Chronic changes as described Electronically Signed   By: Inez Catalina M.D.   On: 02/07/2016 12:19     Management plans discussed with the patient, family and they are in agreement.  CODE STATUS:     Code Status Orders        Start     Ordered   02/07/16 1809  Full code  Continuous     02/07/16 1809    Code Status History    Date Active Date Inactive Code Status Order ID Comments User Context   This patient has a current code status but no historical code status.      TOTAL TIME TAKING CARE OF THIS  PATIENT: 40 minutes.    Nickolus Wadding M.D on 02/08/2016 at 12:43 PM  Between 7am to 6pm - Pager - [phone] After 6pm go to [email or web address] - password [email or web address] [facility]  [place] [facility]  Office  [phone]  CC: Primary care physician; Lilian Coma., MD

## 2016-02-09 ENCOUNTER — Ambulatory Visit: Payer: Medicare HMO | Admitting: Family Medicine

## 2016-02-11 DIAGNOSIS — R0902 Hypoxemia: Secondary | ICD-10-CM

## 2016-02-11 HISTORY — DX: Hypoxemia: R09.02

## 2016-03-22 ENCOUNTER — Ambulatory Visit (INDEPENDENT_AMBULATORY_CARE_PROVIDER_SITE_OTHER): Payer: Medicare HMO | Admitting: Family Medicine

## 2016-03-22 ENCOUNTER — Encounter: Payer: Self-pay | Admitting: Family Medicine

## 2016-03-22 VITALS — BP 156/70 | HR 72 | Temp 97.4°F | Ht 60.0 in | Wt 99.8 lb

## 2016-03-22 DIAGNOSIS — E039 Hypothyroidism, unspecified: Secondary | ICD-10-CM

## 2016-03-22 DIAGNOSIS — R002 Palpitations: Secondary | ICD-10-CM

## 2016-03-22 DIAGNOSIS — J449 Chronic obstructive pulmonary disease, unspecified: Secondary | ICD-10-CM

## 2016-03-22 DIAGNOSIS — I10 Essential (primary) hypertension: Secondary | ICD-10-CM | POA: Insufficient documentation

## 2016-03-22 HISTORY — DX: Essential (primary) hypertension: I10

## 2016-03-22 HISTORY — DX: Palpitations: R00.2

## 2016-03-22 LAB — COMPREHENSIVE METABOLIC PANEL
ALBUMIN: 4.7 g/dL (ref 3.5–5.2)
ALK PHOS: 84 U/L (ref 39–117)
ALT: 9 U/L (ref 0–35)
AST: 16 U/L (ref 0–37)
BUN: 11 mg/dL (ref 6–23)
CALCIUM: 9.9 mg/dL (ref 8.4–10.5)
CHLORIDE: 102 meq/L (ref 96–112)
CO2: 33 mEq/L — ABNORMAL HIGH (ref 19–32)
Creatinine, Ser: 0.49 mg/dL (ref 0.40–1.20)
GFR: 128.87 mL/min (ref >60.00)
Glucose, Bld: 93 mg/dL (ref 70–99)
POTASSIUM: 4.3 meq/L (ref 3.5–5.1)
Sodium: 142 mEq/L (ref 135–145)
Total Bilirubin: 0.4 mg/dL (ref 0.2–1.2)
Total Protein: 7.3 g/dL (ref 6.0–8.3)

## 2016-03-22 LAB — CBC
HEMATOCRIT: 45.5 % (ref 36.0–46.0)
HEMOGLOBIN: 15.3 g/dL — AB (ref 12.0–15.0)
MCHC: 33.6 g/dL (ref 30.0–36.0)
MCV: 95.9 fl (ref 78.0–100.0)
PLATELETS: 240 10*3/uL (ref 150.0–400.0)
RBC: 4.74 Mil/uL (ref 3.87–5.11)
RDW: 12.9 % (ref 11.5–15.5)
WBC: 7 10*3/uL (ref 4.0–10.5)

## 2016-03-22 LAB — TSH: TSH: 0.48 u[IU]/mL (ref 0.35–4.50)

## 2016-03-22 MED ORDER — AMLODIPINE BESYLATE 5 MG PO TABS: 5.0000 mg | ORAL_TABLET | Freq: Every day | ORAL | 1 refills | Status: DC

## 2016-03-22 NOTE — Progress Notes (Signed)
Pre visit review using our clinic review tool, if applicable. No additional management support is needed unless otherwise documented below in the visit note. 

## 2016-03-22 NOTE — Assessment & Plan Note (Signed)
History of COPD exacerbation. Not on any controller medications. Will obtain PFTs to evaluate further.

## 2016-03-22 NOTE — Patient Instructions (Addendum)
Nice to see you. We'll check some lab work today and we will call you with the results. We will refer you to cardiology for evaluation of your palpitations. I would like to obtain lung function tests as well to confirm COPD diagnosis. We will have you come back in 1 week for blood pressure check. If you develop persistent palpitations, chest pain, shortness of breath, or any new or changing symptoms please seek medical attention immediately.

## 2016-03-22 NOTE — Progress Notes (Signed)
Tommi Rumps, MD Phone: [phone]  Jane Davis is a 82 y.o. female who presents today for new patient visit.  Hypothyroidism: Patient notes no skin changes. She reports some weight changes in the past though none recently and it appears that she is stable on review of her prior records from [facility]. Notes some cold intolerance. No heat intolerance. Currently on Synthroid.  Hypertension: Does not check. Taking losartan 50 mg daily. No chest pain, shortness breath, or edema.  Palpitations: Patient notes for a number of years she has felt fluttering in her chest. She notes this occurs periodically throughout the day. Does not last for very long. No chest pain or shortness of breath with this. Was in the process of referral to cardiology prior to moving here.  COPD: No shortness of breath or cough. Was treated for COPD exacerbation earlier this year. Notes intermittent wheezing if the weather is hot. No reported prior PFTs. Does have a nebulizer to use if needed.  Active Ambulatory Problems    Diagnosis Date Noted  . COPD (chronic obstructive pulmonary disease) ([place]) 02/07/2016  . Hypothyroidism 03/22/2016  . Essential hypertension 03/22/2016  . Palpitations 03/22/2016   Resolved Ambulatory Problems    Diagnosis Date Noted  . No Resolved Ambulatory Problems   Past Medical History:  Diagnosis Date  . COPD (chronic obstructive pulmonary disease) ([place])   . Glaucoma   . Hypertension   . Hypothyroidism     Family History  Problem Relation Age of Onset  . Intracerebral hemorrhage Mother     Social History   Social History  . Marital status: Widowed    Spouse name: N/A  . Number of children: N/A  . Years of education: N/A   Occupational History  . Not on file.   Social History Main Topics  . Smoking status: Former Research scientist (life sciences)  . Smokeless tobacco: Never Used  . Alcohol use No  . Drug use: No  . Sexual activity: Not on file   Other Topics Concern  . Not on file    Social History Narrative  . No narrative on file    ROS  General:  Negative for nexplained weight loss, fever Skin: Negative for new or changing mole, sore that won't heal HEENT: Negative for trouble hearing, trouble seeing, ringing in ears, mouth sores, hoarseness, change in voice, dysphagia. CV: Positive for palpitations, Negative for chest pain, dyspnea, edema, palpitations Resp: Negative for cough, dyspnea, hemoptysis GI: Negative for nausea, vomiting, diarrhea, constipation, abdominal pain, melena, hematochezia. GU: Negative for dysuria, incontinence, urinary hesitance, hematuria, vaginal or penile discharge, polyuria, sexual difficulty, lumps in testicle or breasts MSK: Negative for muscle cramps or aches, joint pain or swelling Neuro: Negative for headaches, weakness, numbness, dizziness, passing out/fainting Psych: Negative for depression, anxiety, memory problems  Objective  Physical Exam Vitals:   03/22/16 1426  BP: (!) 156/70  Pulse: 72  Temp: 97.4 F (36.3 C)    BP Readings from Last 3 Encounters:  03/22/16 (!) 156/70  02/08/16 (!) 120/54   Wt Readings from Last 3 Encounters:  03/22/16 99 lb 12.8 oz (45.3 kg)  02/07/16 97 lb 12.8 oz (44.4 kg)    Physical Exam  Constitutional: No distress.  HENT:  Head: Normocephalic and atraumatic.  Mouth/Throat: Oropharynx is clear and moist. No oropharyngeal exudate.  Eyes: Conjunctivae are normal. Pupils are equal, round, and reactive to light.  Cardiovascular: Normal rate, regular rhythm and normal heart sounds.   Pulmonary/Chest: Effort normal and breath sounds normal.  Abdominal:  Soft. Bowel sounds are normal. She exhibits no distension. There is no tenderness.  Musculoskeletal: She exhibits no edema.  Neurological: She is alert. Gait normal.  Skin: Skin is warm and dry. She is not diaphoretic.  Psychiatric: Mood and affect normal.   EKG: Normal sinus rhythm, rate 71, RSR prime in V1 him and no ST or T-wave  changes  Assessment/Plan:   Hypothyroidism She'll continue her current dose of Synthroid. We'll check a TSH today.  Essential hypertension Slightly above goal today. She's currently on losartan. I discussed adding an additional medication though she is hesitant to do this. She will check her blood pressure at home and return in 1 week for a nurse visit for blood pressure check. Of note her most recent blood pressure at her prior PCPs office was in the normal range on review of care everywhere. We'll check lab work as outlined below.  Palpitations Patient with intermittent history of palpitations for several years. EKG checked today. Labs as outlined below. We will refer to cardiology. Given return precautions.   COPD (chronic obstructive pulmonary disease) (HCC) History of COPD exacerbation. Not on any controller medications. Will obtain PFTs to evaluate further.   Orders Placed This Encounter  Procedures  . TSH  . CBC  . Comp Met (CMET)  . Ambulatory referral to Cardiology    Referral Priority:   Routine    Referral Type:   Consultation    Referral Reason:   Specialty Services Required    Requested Specialty:   Cardiology    Number of Visits Requested:   1  . EKG 12-Lead  . Pulmonary function test    Please have a pulmonologist interpret these.    Standing Status:   Future    Standing Expiration Date:   03/22/2017    Order Specific Question:   Where should this test be performed?    Answer:    Pulmonary    Order Specific Question:   Full PFT: includes the following: basic spirometry, spirometry pre & post bronchodilator, diffusion capacity (DLCO), lung volumes    Answer:   Full PFT    Meds ordered this encounter  Medications  . DISCONTD: amLODipine (NORVASC) 5 MG tablet    Sig: Take 1 tablet (5 mg total) by mouth daily.    Dispense:  90 tablet    Refill:  1     Tommi Rumps, MD [place]

## 2016-03-22 NOTE — Assessment & Plan Note (Addendum)
Slightly above goal today. She's currently on losartan. I discussed adding an additional medication though she is hesitant to do this. She will check her blood pressure at home and return in 1 week for a nurse visit for blood pressure check. Of note her most recent blood pressure at her prior PCPs office was in the normal range on review of care everywhere. We'll check lab work as outlined below.

## 2016-03-22 NOTE — Assessment & Plan Note (Signed)
She'll continue her current dose of Synthroid. We'll check a TSH today.

## 2016-03-22 NOTE — Assessment & Plan Note (Addendum)
Patient with intermittent history of palpitations for several years. EKG checked today. Labs as outlined below. We will refer to cardiology. Given return precautions.

## 2016-03-26 ENCOUNTER — Other Ambulatory Visit: Payer: Self-pay | Admitting: Family Medicine

## 2016-03-26 DIAGNOSIS — D582 Other hemoglobinopathies: Secondary | ICD-10-CM

## 2016-03-29 ENCOUNTER — Ambulatory Visit (INDEPENDENT_AMBULATORY_CARE_PROVIDER_SITE_OTHER): Payer: Medicare HMO

## 2016-03-29 ENCOUNTER — Other Ambulatory Visit (INDEPENDENT_AMBULATORY_CARE_PROVIDER_SITE_OTHER): Payer: Medicare HMO

## 2016-03-29 VITALS — BP 144/78 | HR 71

## 2016-03-29 DIAGNOSIS — D582 Other hemoglobinopathies: Secondary | ICD-10-CM

## 2016-03-29 DIAGNOSIS — I10 Essential (primary) hypertension: Secondary | ICD-10-CM | POA: Diagnosis not present

## 2016-03-29 LAB — CBC
HCT: 45.6 % (ref 36.0–46.0)
Hemoglobin: 15 g/dL (ref 12.0–15.0)
MCHC: 33 g/dL (ref 30.0–36.0)
MCV: 95.5 fl (ref 78.0–100.0)
Platelets: 226 10*3/uL (ref 150.0–400.0)
RBC: 4.77 Mil/uL (ref 3.87–5.11)
RDW: 13.4 % (ref 11.5–15.5)
WBC: 5.8 10*3/uL (ref 4.0–10.5)

## 2016-03-29 NOTE — Progress Notes (Signed)
Patient comes in for 1 week blood pressure check.  Only check blood pressure in right arm due to blood work in left arm.  Please advise.

## 2016-04-04 NOTE — Progress Notes (Signed)
BP improved and will controlled for her age. Continue to monitor.

## 2016-04-05 NOTE — Progress Notes (Signed)
Patient advised verbalized understanding

## 2016-04-06 NOTE — Progress Notes (Signed)
Care was provided under my supervision. I agree with the management as indicated in the note.  Colburn Asper DO  

## 2016-04-12 ENCOUNTER — Encounter: Payer: Self-pay | Admitting: Cardiology

## 2016-04-12 ENCOUNTER — Ambulatory Visit (INDEPENDENT_AMBULATORY_CARE_PROVIDER_SITE_OTHER): Payer: Medicare HMO | Admitting: Cardiology

## 2016-04-12 VITALS — BP 154/60 | HR 75 | Ht 60.0 in | Wt 100.2 lb

## 2016-04-12 DIAGNOSIS — I498 Other specified cardiac arrhythmias: Secondary | ICD-10-CM

## 2016-04-12 DIAGNOSIS — R0602 Shortness of breath: Secondary | ICD-10-CM | POA: Diagnosis not present

## 2016-04-12 DIAGNOSIS — I10 Essential (primary) hypertension: Secondary | ICD-10-CM

## 2016-04-12 NOTE — Progress Notes (Signed)
Cardiology Office Note   Date:  04/12/2016   ID:  Jane Davis, DOB 07-Apr-1934, MRN [id number]  Referring Doctor:  Tommi Rumps, MD   Cardiologist:   Wende Bushy, MD   Reason for consultation:  Chief Complaint  Patient presents with  . other    Ref by Dr. Caryl Bis for chest fluttering. Meds reviewed by the pt. verbally.       History of Present Illness: Jane Davis is a 83 y.o. female who presents for Chest fluttering. Unfortunately, patient not very detailed in description. There appears that she's been having chest fluttering for a couple of years now. It is not clear whether she has them daily or not so frequently. Symptoms mainly in the chest, nonradiating. Randomly occurring occurring a few seconds at a time. Her deck when they're can happen. No known precipitating factors. Denies chest pain. She does report some shortness of breath with exertion. She does not know whether this is related to her age.  Patient denies PND, orthopnea, edema. No loss of consciousness.   ROS:  Please see the history of present illness. Aside from mentioned under HPI, all other systems are reviewed and negative.     Past Medical History:  Diagnosis Date  . COPD (chronic obstructive pulmonary disease) ([place])   . Glaucoma   . Hypertension   . Hypothyroidism     Past Surgical History:  Procedure Laterality Date  . ABDOMINAL HYSTERECTOMY       reports that she quit smoking about 3 months ago. Her smoking use included Cigarettes. She has a 40.00 pack-year smoking history. She has never used smokeless tobacco. She reports that she does not drink alcohol or use drugs.   family history includes Intracerebral hemorrhage in her mother.   Outpatient Medications Prior to Visit  Medication Sig Dispense Refill  . albuterol (ACCUNEB) 1.25 MG/3ML nebulizer solution Take 1.25 mg by nebulization every 6 (six) hours as needed.    Marland Kitchen aspirin EC 81 MG tablet Take 81 mg by mouth daily.    Marland Kitchen  latanoprost (XALATAN) 0.005 % ophthalmic solution Place 1 drop into both eyes at bedtime.     Marland Kitchen levothyroxine (SYNTHROID, LEVOTHROID) 100 MCG tablet Take 1 tablet by mouth daily.    Marland Kitchen losartan (COZAAR) 50 MG tablet Take 50 mg by mouth daily.    . Timolol Maleate 0.5 % (DAILY) SOLN Place 1 drop into the right eye daily.     No facility-administered medications prior to visit.      Allergies: Patient has no known allergies.    PHYSICAL EXAM: VS:  BP (!) 154/60 (BP Location: Right Arm, Patient Position: Sitting, Cuff Size: Normal)   Pulse 75   Ht 5' (1.524 m)   Wt 100 lb 4 oz (45.5 kg)   BMI 19.58 kg/m  , Body mass index is 19.58 kg/m. Wt Readings from Last 3 Encounters:  04/12/16 100 lb 4 oz (45.5 kg)  03/22/16 99 lb 12.8 oz (45.3 kg)  02/07/16 97 lb 12.8 oz (44.4 kg)    GENERAL:  well developed, well nourished, thin appearing, not in acute distress HEENT: normocephalic, pink conjunctivae, anicteric sclerae, no xanthelasma, normal dentition, oropharynx clear NECK:  no neck vein engorgement, JVP normal, no hepatojugular reflux, carotid upstroke brisk and symmetric, no bruit, no thyromegaly, no lymphadenopathy LUNGS:  good respiratory effort, clear to auscultation bilaterally CV:  PMI not displaced, no thrills, no lifts, S1 and S2 within normal limits, no palpable S3 or S4, no murmurs,  no rubs, no gallops ABD:  Soft, nontender, nondistended, normoactive bowel sounds, no abdominal aortic bruit, no hepatomegaly, no splenomegaly MS: nontender back, no kyphosis, no scoliosis, no joint deformities EXT:  2+ DP/PT pulses, no edema, no varicosities, no cyanosis, no clubbing SKIN: warm, nondiaphoretic, normal turgor, no ulcers NEUROPSYCH: alert, oriented to person, place, and time, sensory/motor grossly intact, normal mood, appropriate affect  Recent Labs: 03/22/2016: ALT 9; BUN 11; Creatinine, Ser 0.49; Potassium 4.3; Sodium 142; TSH 0.48 03/29/2016: Hemoglobin 15.0; Platelets 226.0    Lipid Panel No results found for: CHOL, TRIG, HDL, CHOLHDL, VLDL, LDLCALC, LDLDIRECT   Other studies Reviewed:  EKG:  The ekg from 04/12/2016 was personally reviewed by me and it revealed sinus rhythm, 75 BPM.  Additional studies/ records that were reviewed personally reviewed by me today include: None available   ASSESSMENT AND PLAN: Palpitations or chest fluttering and shortness breath Recommend to start 48 hour Holter monitor for now. Recommend echocardiogram  recommend pharmacologic nuclear stress test. Patient very concerned whether she has blockages in her coronary arteries.  HTN Blood pressure today is higher than her usual. BP log and monitoring recommended. PCP following this.   Current medicines are reviewed at length with the patient today.  The patient does not have concerns regarding medicines.  Labs/ tests ordered today include:  Orders Placed This Encounter  Procedures  . EKG 12-Lead    I had a lengthy and detailed discussion with the patient regarding diagnoses, prognosis, diagnostic options, treatment options , and side effects of medications.   I counseled the patient on importance of lifestyle modification including heart healthy diet, regular physical activity once cardiac workup completed   Disposition:   FU with undersigned after tests   Signed, Wende Bushy, MD  04/12/2016 10:59 AM    [place]  This note was generated in part with voice recognition software and I apologize for any typographical errors that were not detected and corrected.

## 2016-04-12 NOTE — Patient Instructions (Addendum)
Testing/Procedures: Your physician has requested that you have an echocardiogram. Echocardiography is a painless test that uses sound waves to create images of your heart. It provides your doctor with information about the size and shape of your heart and how well your heart's chambers and valves are working. This procedure takes approximately one hour. There are no restrictions for this procedure.  Your physician has recommended that you wear a holter monitor. Holter monitors are medical devices that record the heart's electrical activity. Doctors most often use these monitors to diagnose arrhythmias. Arrhythmias are problems with the speed or rhythm of the heartbeat. The monitor is a small, portable device. You can wear one while you do your normal daily activities. This is usually used to diagnose what is causing palpitations/syncope (passing out).  Jane Davis  Your caregiver has ordered a Stress Test with nuclear imaging. The purpose of this test is to evaluate the blood supply to your heart muscle. This procedure is referred to as a "Non-Invasive Stress Test." This is because other than having an IV started in your vein, nothing is inserted or "invades" your body. Cardiac stress tests are done to find areas of poor blood flow to the heart by determining the extent of coronary artery disease (CAD). Some patients exercise on a treadmill, which naturally increases the blood flow to your heart, while others who are  unable to walk on a treadmill due to physical limitations have a pharmacologic/chemical stress agent called Lexiscan . This medicine will mimic walking on a treadmill by temporarily increasing your coronary blood flow.   Please note: these test may take anywhere between 2-4 hours to complete  PLEASE REPORT TO [place] AT THE FIRST DESK WILL DIRECT YOU WHERE TO GO  Date of Procedure:_Friday April 21, 2016 at 08:00AM___  Arrival Time for  Procedure:_Arrive at 07:45AM to register___   PLEASE NOTIFY THE OFFICE AT LEAST 24 HOURS IN ADVANCE IF YOU ARE UNABLE TO [place].  [phone] AND  PLEASE NOTIFY NUCLEAR MEDICINE AT [facility] AT LEAST 24 HOURS IN ADVANCE IF YOU ARE UNABLE TO KEEP YOUR APPOINTMENT. [phone]  How to prepare for your Myoview test:  1. Do not eat or drink after midnight 2. No caffeine for 24 hours prior to test 3. No smoking 24 hours prior to test. 4. Your medication may be taken with water.  If your doctor stopped a medication because of this test, do not take that medication. 5. Ladies, please do not wear dresses.  Skirts or pants are appropriate. Please wear a short sleeve shirt. 6. No perfume, cologne or lotion. 7. Wear comfortable walking shoes. No heels!   Follow-Up: Your physician recommends that you schedule a follow-up appointment as needed with Dr. Yvone Neu. We will call you with results and if needed schedule follow up at that time.  It was a pleasure seeing you today here in the office. Please do not hesitate to give [place] a call back if you have any further questions. [place], BSN    Echocardiogram An echocardiogram, or echocardiography, uses sound waves (ultrasound) to produce an image of your heart. The echocardiogram is simple, painless, obtained within a short period of time, and offers valuable information to your health care provider. The images from an echocardiogram can provide information such as:  Evidence of coronary artery disease (CAD).  Heart size.  Heart muscle function.  Heart valve function.  Aneurysm detection.  Evidence of a past  heart attack.  Fluid buildup around the heart.  Heart muscle thickening.  Assess heart valve function. Tell a health care provider about:  Any allergies you have.  All medicines you are taking, including vitamins, herbs, eye drops, creams, and over-the-counter medicines.  Any problems you or family  members have had with anesthetic medicines.  Any blood disorders you have.  Any surgeries you have had.  Any medical conditions you have.  Whether you are pregnant or may be pregnant. What happens before the procedure? No special preparation is needed. Eat and drink normally. What happens during the procedure?  In order to produce an image of your heart, gel will be applied to your chest and a wand-like tool (transducer) will be moved over your chest. The gel will help transmit the sound waves from the transducer. The sound waves will harmlessly bounce off your heart to allow the heart images to be captured in real-time motion. These images will then be recorded.  You may need an IV to receive a medicine that improves the quality of the pictures. What happens after the procedure? You may return to your normal schedule including diet, activities, and medicines, unless your health care provider tells you otherwise. This information is not intended to replace advice given to you by your health care provider. Make sure you discuss any questions you have with your health care provider. Document Released: 03/24/2000 Document Revised: 11/13/2015 Document Reviewed: 12/02/2012 Elsevier Interactive Patient Education  2017 [employer].  Holter Monitoring Introduction A Holter monitor is a small device that is used to detect abnormal heart rhythms. It clips to your clothing and is connected by wires to flat, sticky disks (electrodes) that attach to your chest. It is worn continuously for 24-48 hours. Follow these instructions at home:  Wear your Holter monitor at all times, even while exercising and sleeping, for as long as directed by your health care provider.  Make sure that the Holter monitor is safely clipped to your clothing or close to your body as recommended by your health care provider.  Do not get the monitor or wires wet.  Do not put body lotion or moisturizer on your  chest.  Keep your skin clean.  Keep a diary of your daily activities, such as walking and doing chores. If you feel that your heartbeat is abnormal or that your heart is fluttering or skipping a beat:  Record what you are doing when it happens.  Record what time of day the symptoms occur.  Return your Holter monitor as directed by your health care provider.  Keep all follow-up visits as directed by your health care provider. This is important. Get help right away if:  You feel lightheaded or you faint.  You have trouble breathing.  You feel pain in your chest, upper arm, or jaw.  You feel sick to your stomach and your skin is pale, cool, or damp.  You heartbeat feels unusual or abnormal. This information is not intended to replace advice given to you by your health care provider. Make sure you discuss any questions you have with your health care provider. Document Released: 12/24/2003 Document Revised: 09/02/2015 Document Reviewed: 11/03/2013  2017 Elsevier Pharmacologic Stress Electrocardiogram A pharmacologic stress electrocardiogram is a heart (cardiac) test that uses nuclear imaging to evaluate the blood supply to your heart. This test may also be called a pharmacologic stress electrocardiography. Pharmacologic means that a medicine is used to increase your heart rate and blood pressure.  This stress  test is done to find areas of poor blood flow to the heart by determining the extent of coronary artery disease (CAD). Some people exercise on a treadmill, which naturally increases the blood flow to the heart. For those people unable to exercise on a treadmill, a medicine is used. This medicine stimulates your heart and will cause your heart to beat harder and more quickly, as if you were exercising.  Pharmacologic stress tests can help determine:  The adequacy of blood flow to your heart during increased levels of activity in order to clear you for discharge home.  The extent of  coronary artery blockage caused by CAD.  Your prognosis if you have suffered a heart attack.  The effectiveness of cardiac procedures done, such as an angioplasty, which can increase the circulation in your coronary arteries.  Causes of chest pain or pressure. LET [facility] CARE PROVIDER KNOW ABOUT:  Any allergies you have.  All medicines you are taking, including vitamins, herbs, eye drops, creams, and over-the-counter medicines.  Previous problems you or members of your family have had with the use of anesthetics.  Any blood disorders you have.  Previous surgeries you have had.  Medical conditions you have.  Possibility of pregnancy, if this applies.  If you are currently breastfeeding. RISKS AND COMPLICATIONS Generally, this is a safe procedure. However, as with any procedure, complications can occur. Possible complications include:  You develop pain or pressure in the following areas:  Chest.  Jaw or neck.  Between your shoulder blades.  Radiating down your left arm.  Headache.  Dizziness or light-headedness.  Shortness of breath.  Increased or irregular heartbeat.  Low blood pressure.  Nausea or vomiting.  Flushing.  Redness going up the arm and slight pain during injection of medicine.  Heart attack (rare). BEFORE THE PROCEDURE   Avoid all forms of caffeine for 24 hours before your test or as directed by your health care provider. This includes coffee, tea (even decaffeinated tea), caffeinated sodas, chocolate, cocoa, and certain pain medicines.  Follow your health care provider's instructions regarding eating and drinking before the test.  Take your medicines as directed at regular times with water unless instructed otherwise. Exceptions may include:  If you have diabetes, ask how you are to take your insulin or pills. It is common to adjust insulin dosing the morning of the test.  If you are taking beta-blocker medicines, it is important to  talk to your health care provider about these medicines well before the date of your test. Taking beta-blocker medicines may interfere with the test. In some cases, these medicines need to be changed or stopped 24 hours or more before the test.  If you wear a nitroglycerin patch, it may need to be removed prior to the test. Ask your health care provider if the patch should be removed before the test.  If you use an inhaler for any breathing condition, bring it with you to the test.  If you are an outpatient, bring a snack so you can eat right after the stress phase of the test.  Do not smoke for 4 hours prior to the test or as directed by your health care provider.  Do not apply lotions, powders, creams, or oils on your chest prior to the test.  Wear comfortable shoes and clothing. Let your health care provider know if you were unable to complete or follow the preparations for your test. PROCEDURE   Multiple patches (electrodes) will be put on  your chest. If needed, small areas of your chest may be shaved to get better contact with the electrodes. Once the electrodes are attached to your body, multiple wires will be attached to the electrodes, and your heart rate will be monitored.  An IV access will be started. A nuclear trace (isotope) is given. The isotope may be given intravenously, or it may be swallowed. Nuclear refers to several types of radioactive isotopes, and the nuclear isotope lights up the arteries so that the nuclear images are clear. The isotope is absorbed by your body. This results in low radiation exposure.  A resting nuclear image is taken to show how your heart functions at rest.  A medicine is given through the IV access.  A second scan is done about 1 hour after the medicine injection and determines how your heart functions under stress.  During this stress phase, you will be connected to an electrocardiogram machine. Your blood pressure and oxygen levels will be  monitored. AFTER THE PROCEDURE   Your heart rate and blood pressure will be monitored after the test.  You may return to your normal schedule, including diet,activities, and medicines, unless your health care provider tells you otherwise. This information is not intended to replace advice given to you by your health care provider. Make sure you discuss any questions you have with your health care provider. Document Released: 08/13/2008 Document Revised: 04/01/2013 Document Reviewed: 12/02/2012 Elsevier Interactive Patient Education  2017 [employer].

## 2016-04-21 ENCOUNTER — Encounter
Admission: RE | Admit: 2016-04-21 | Discharge: 2016-04-21 | Disposition: A | Discharge status: Home / Self Care | Payer: Commercial Managed Care - HMO | Source: Ambulatory Visit | Attending: Cardiology | Admitting: Cardiology

## 2016-04-21 DIAGNOSIS — R0602 Shortness of breath: Secondary | ICD-10-CM | POA: Diagnosis not present

## 2016-04-21 DIAGNOSIS — I498 Other specified cardiac arrhythmias: Secondary | ICD-10-CM | POA: Diagnosis not present

## 2016-04-21 LAB — NM MYOCAR MULTI W/SPECT W/WALL MOTION / EF
CHL CUP NUCLEAR SRS: 0
CHL CUP RESTING HR STRESS: 66 {beats}/min
CSEPHR: 72 %
LV dias vol: 22 mL (ref 46–106)
LVSYSVOL: 5 mL
Peak HR: 102 {beats}/min
SDS: 0
SSS: 0
TID: 1.14

## 2016-04-21 MED ORDER — REGADENOSON 0.4 MG/5ML IV SOLN
0.4000 mg | Freq: Once | INTRAVENOUS | Status: AC
  Administered 2016-04-21: 0.4 mg via INTRAVENOUS

## 2016-04-21 MED ORDER — TECHNETIUM TC 99M TETROFOSMIN IV KIT
13.0000 | PACK | Freq: Once | INTRAVENOUS | Status: AC | PRN
  Administered 2016-04-21: 13 via INTRAVENOUS

## 2016-04-21 MED ORDER — TECHNETIUM TC 99M TETROFOSMIN IV KIT
32.2300 | PACK | Freq: Once | INTRAVENOUS | Status: AC | PRN
  Administered 2016-04-21: 32.23 via INTRAVENOUS

## 2016-05-03 ENCOUNTER — Ambulatory Visit (INDEPENDENT_AMBULATORY_CARE_PROVIDER_SITE_OTHER): Payer: Commercial Managed Care - HMO

## 2016-05-03 ENCOUNTER — Other Ambulatory Visit: Payer: Self-pay

## 2016-05-03 DIAGNOSIS — R0602 Shortness of breath: Secondary | ICD-10-CM

## 2016-05-03 DIAGNOSIS — I498 Other specified cardiac arrhythmias: Secondary | ICD-10-CM | POA: Diagnosis not present

## 2016-05-05 ENCOUNTER — Encounter: Payer: Self-pay | Admitting: *Deleted

## 2016-05-09 ENCOUNTER — Ambulatory Visit
Admission: RE | Admit: 2016-05-09 | Discharge: 2016-05-09 | Disposition: A | Discharge status: Home / Self Care | Payer: Medicare HMO | Source: Ambulatory Visit | Attending: Cardiology | Admitting: Cardiology

## 2016-05-09 DIAGNOSIS — I493 Ventricular premature depolarization: Secondary | ICD-10-CM | POA: Diagnosis not present

## 2016-05-09 DIAGNOSIS — R0602 Shortness of breath: Secondary | ICD-10-CM | POA: Insufficient documentation

## 2016-05-09 DIAGNOSIS — I498 Other specified cardiac arrhythmias: Secondary | ICD-10-CM | POA: Insufficient documentation

## 2016-05-09 DIAGNOSIS — I491 Atrial premature depolarization: Secondary | ICD-10-CM | POA: Diagnosis not present

## 2016-05-11 ENCOUNTER — Telehealth: Payer: Self-pay | Admitting: *Deleted

## 2016-05-11 NOTE — Telephone Encounter (Signed)
Spoke with patient and reviewed results and recommendations with her. She verbalized understanding and stated that she did not feel the palpitations were too bad at this time and would like to wait on trying that medication. Instructed her to give [place] a call back if she has any further questions or if she would like to try that medication. She verbalized understanding of our conversation and has no further questions at this time.

## 2016-05-11 NOTE — Telephone Encounter (Signed)
-----   Message from Wende Bushy, MD sent at 05/11/2016 10:51 AM EST ----- Overall rhythm was sinus. No high grade ectopy. She did have PVCs and PACs but no runs. If she is symptomatic from the extra beats, she can do a trial of metoprolol tartrate 25 mg by mouth twice a day. Her blood pressure in the office was generous so she should be able to tolerate this. Blood pressure log recommended.

## 2016-05-29 ENCOUNTER — Encounter: Payer: Self-pay | Admitting: Family Medicine

## 2016-05-29 ENCOUNTER — Ambulatory Visit (INDEPENDENT_AMBULATORY_CARE_PROVIDER_SITE_OTHER): Payer: Medicare HMO | Admitting: Family Medicine

## 2016-05-29 VITALS — BP 160/70 | HR 80 | Temp 97.9°F | Wt 103.6 lb

## 2016-05-29 DIAGNOSIS — I10 Essential (primary) hypertension: Secondary | ICD-10-CM

## 2016-05-29 DIAGNOSIS — K629 Disease of anus and rectum, unspecified: Secondary | ICD-10-CM

## 2016-05-29 DIAGNOSIS — R002 Palpitations: Secondary | ICD-10-CM | POA: Diagnosis not present

## 2016-05-29 DIAGNOSIS — N3941 Urge incontinence: Secondary | ICD-10-CM | POA: Insufficient documentation

## 2016-05-29 DIAGNOSIS — K649 Unspecified hemorrhoids: Secondary | ICD-10-CM | POA: Insufficient documentation

## 2016-05-29 HISTORY — DX: Disease of anus and rectum, unspecified: K62.9

## 2016-05-29 HISTORY — DX: Urge incontinence: N39.41

## 2016-05-29 NOTE — Progress Notes (Signed)
Pre visit review using our clinic review tool, if applicable. No additional management support is needed unless otherwise documented below in the visit note. 

## 2016-05-29 NOTE — Patient Instructions (Addendum)
Nice to see you. I have placed a referral to general surgery to look at her rectal lesion and urology to evaluate your urinary urgency and likely cystocele.

## 2016-05-29 NOTE — Progress Notes (Signed)
Tommi Rumps, MD Phone: [phone]  Jane Davis is a 83 y.o. female who presents today for follow-up.  Hypertension: Patient does not check her blood pressure at home. She's currently on losartan. No chest pain, shortness breath, or edema. Takes an aspirin daily.  Patient notes rarely feeling a fluttering in her chest. She had evaluation by cardiology for this with a low risk stress test and a Holter monitor that revealed infrequent PVCs and PACs.  Patient additionally notes she has urinary urgency when she stands up and feels as though her bladder is dropping. She notes some incontinence with this at times. No abdominal pain or urinary frequency or dysuria.  PMH: Former smoker   ROS see history of present illness  Objective  Physical Exam Vitals:   05/29/16 1348  BP: (!) 160/70  Pulse: 80  Temp: 97.9 F (36.6 C)    BP Readings from Last 3 Encounters:  05/29/16 (!) 160/70  04/12/16 (!) 154/60  03/29/16 (!) 144/78   Wt Readings from Last 3 Encounters:  05/29/16 103 lb 9.6 oz (47 kg)  04/12/16 100 lb 4 oz (45.5 kg)  03/22/16 99 lb 12.8 oz (45.3 kg)    Physical Exam  Constitutional: No distress.  HENT:  Head: Normocephalic and atraumatic.  Cardiovascular: Normal rate, regular rhythm and normal heart sounds.   Pulmonary/Chest: Effort normal and breath sounds normal.  Abdominal: Soft. Bowel sounds are normal. She exhibits no distension. There is no tenderness. There is no rebound and no guarding.  Genitourinary:  Genitourinary Comments: Normal labia, atrophic vaginal mucosa, patient status post hysterectomy with no cervix, cystocele and possible rectocele noted on exam, white elongated lesion from rectum attached externally  Musculoskeletal: She exhibits no edema.  Neurological: She is alert. Gait normal.  Skin: Skin is warm and dry. She is not diaphoretic.     Assessment/Plan: Please see individual problem list.  Palpitations Continues to have rare  occurrences of fluttering. Had cardiac evaluation revealing PVCs and PACs. I did discuss treatment with a beta blocker for this as the cardiologist recommended. Patient does not want treatment at this time and will continue to monitor.  Urge incontinence of urine Patient notes issues with urinary urge incontinence for some time. She feels as though her bladder is dropping. Pelvic exam completed today. Refer to urology.  Rectal lesion Potentially could be a skin tag from prior hemorrhoid though given the appearance will refer to general surgery for further evaluation.  Essential hypertension Blood pressure above goal today. Discussed increasing her losartan versus adding an additional medication. She is hesitant to add an additional medication. When I discussed the need for lab work today and in a week if we increase the losartan dose she deferred this as well. She opted to check her blood pressure at home for the next 2 weeks and follow-up for nurse visit for blood pressure check. if elevated at home and elevated in the office we'll consider increasing her losartan dose.   Orders Placed This Encounter  Procedures  . Ambulatory referral to General Surgery    Referral Priority:   Routine    Referral Type:   Surgical    Referral Reason:   Specialty Services Required    Requested Specialty:   General Surgery    Number of Visits Requested:   1  . Ambulatory referral to Urology    Referral Priority:   Routine    Referral Type:   Consultation    Referral Reason:   Specialty Services Required  Requested Specialty:   Urology    Number of Visits Requested:   1    No orders of the defined types were placed in this encounter.   # Healthcare maintenance: Did discuss mammogram with patient though she declined this.   Tommi Rumps, MD [place]

## 2016-05-29 NOTE — Assessment & Plan Note (Signed)
Patient notes issues with urinary urge incontinence for some time. She feels as though her bladder is dropping. Pelvic exam completed today. Refer to urology.

## 2016-05-29 NOTE — Assessment & Plan Note (Signed)
Continues to have rare occurrences of fluttering. Had cardiac evaluation revealing PVCs and PACs. I did discuss treatment with a beta blocker for this as the cardiologist recommended. Patient does not want treatment at this time and will continue to monitor.

## 2016-05-29 NOTE — Assessment & Plan Note (Signed)
Blood pressure above goal today. Discussed increasing her losartan versus adding an additional medication. She is hesitant to add an additional medication. When I discussed the need for lab work today and in a week if we increase the losartan dose she deferred this as well. She opted to check her blood pressure at home for the next 2 weeks and follow-up for nurse visit for blood pressure check. if elevated at home and elevated in the office we'll consider increasing her losartan dose.

## 2016-05-29 NOTE — Assessment & Plan Note (Signed)
Potentially could be a skin tag from prior hemorrhoid though given the appearance will refer to general surgery for further evaluation.

## 2016-05-31 ENCOUNTER — Encounter: Payer: Self-pay | Admitting: General Surgery

## 2016-06-01 ENCOUNTER — Encounter: Payer: Self-pay | Admitting: Urology

## 2016-06-01 ENCOUNTER — Ambulatory Visit (INDEPENDENT_AMBULATORY_CARE_PROVIDER_SITE_OTHER): Payer: Medicare HMO | Admitting: Urology

## 2016-06-01 VITALS — BP 193/71 | HR 84 | Ht 60.0 in | Wt 104.0 lb

## 2016-06-01 DIAGNOSIS — N3941 Urge incontinence: Secondary | ICD-10-CM | POA: Diagnosis not present

## 2016-06-01 DIAGNOSIS — N3946 Mixed incontinence: Secondary | ICD-10-CM

## 2016-06-01 LAB — URINALYSIS, COMPLETE
Bilirubin, UA: NEGATIVE
GLUCOSE, UA: NEGATIVE
KETONES UA: NEGATIVE
LEUKOCYTES UA: NEGATIVE
NITRITE UA: NEGATIVE
Protein, UA: NEGATIVE
RBC, UA: NEGATIVE
Urobilinogen, Ur: 0.2 mg/dL (ref 0.2–1.0)
pH, UA: 5 (ref 5.0–7.5)

## 2016-06-01 LAB — BLADDER SCAN AMB NON-IMAGING

## 2016-06-01 LAB — MICROSCOPIC EXAMINATION: BACTERIA UA: NONE SEEN

## 2016-06-01 MED ORDER — SOLIFENACIN SUCCINATE 5 MG PO TABS: 5.0000 mg | ORAL_TABLET | Freq: Every day | ORAL | 11 refills | Status: DC

## 2016-06-01 NOTE — Progress Notes (Signed)
06/01/2016 9:00 AM   Jane Davis [birth year] [id number]  Referring provider: Leone Haven, MD [address] [place], [place] [address]  Chief Complaint  Patient presents with  . Urinary Incontinence    New Patient    HPI: I was consulted to assess the patient's incontinence worsening over 4-5 years. She primarily has urgency incontinence. Sometimes she leaks with coughing and sneezing but not bending and lifting. She wears 2 or 3 pads a day that are quite wet. She does not have bedwetting.  She voids every 1 or 2 hours and does not have nocturia.  She denies a history of kidney stones previous GU surgery and urinary tract infections. Her bowel movements are normal. She has no neurologic issues. Her presentation is not been medically treated  Modifying factors: There are no other modifying factors  Associated signs and symptoms: There are no other associated signs and symptoms Aggravating and relieving factors: There are no other aggravating or relieving factors Severity: Moderate Duration: Persistent     PMH: Past Medical History:  Diagnosis Date  . COPD (chronic obstructive pulmonary disease) ([place])   . Essential hypertension 03/22/2016  . Glaucoma   . Hypertension   . Hypothyroidism   . Hypoxia 02/11/2016   Overview:  Discharged from hospital on home oxygen.  . Palpitations 03/22/2016  . Prediabetes 02/19/2015  . Rectal lesion 05/29/2016  . Urge incontinence of urine 05/29/2016    Surgical History: Past Surgical History:  Procedure Laterality Date  . ABDOMINAL HYSTERECTOMY      Home Medications:  Allergies as of 06/01/2016   No Known Allergies     Medication List       Accurate as of 06/01/16  9:00 AM. Always use your most recent med list.          albuterol 1.25 MG/3ML nebulizer solution Commonly known as:  ACCUNEB Take 1.25 mg by nebulization every 6 (six) hours as needed.   aspirin EC 81 MG tablet Take 81 mg by mouth daily.     latanoprost 0.005 % ophthalmic solution Commonly known as:  XALATAN Place 1 drop into both eyes at bedtime.   levothyroxine 100 MCG tablet Commonly known as:  SYNTHROID, LEVOTHROID Take 1 tablet by mouth daily.   losartan 50 MG tablet Commonly known as:  COZAAR Take 50 mg by mouth daily.   Timolol Maleate 0.5 % (DAILY) Soln Place 1 drop into the right eye daily.       Allergies: No Known Allergies  Family History: Family History  Problem Relation Age of Onset  . Intracerebral hemorrhage Mother   . Bladder Cancer Neg Hx   . Prostate cancer Neg Hx     Social History:  reports that she quit smoking about 4 months ago. Her smoking use included Cigarettes. She has a 40.00 pack-year smoking history. She has never used smokeless tobacco. She reports that she does not drink alcohol or use drugs.  ROS: UROLOGY Frequent Urination?: Yes Hard to postpone urination?: Yes Burning/pain with urination?: No Get up at night to urinate?: No Leakage of urine?: Yes Urine stream starts and stops?: No Trouble starting stream?: No Do you have to strain to urinate?: No Blood in urine?: No Urinary tract infection?: No Sexually transmitted disease?: No Injury to kidneys or bladder?: No Painful intercourse?: No Weak stream?: No Currently pregnant?: No Vaginal bleeding?: No Last menstrual period?: hysterectomy  Gastrointestinal Nausea?: No Vomiting?: No Indigestion/heartburn?: No Diarrhea?: No Constipation?: No  Constitutional Fever: No Night  sweats?: No Weight loss?: No Fatigue?: No  Skin Skin rash/lesions?: No Itching?: No  Eyes Blurred vision?: No Double vision?: No  Ears/Nose/Throat Sore throat?: No Sinus problems?: No  Hematologic/Lymphatic Swollen glands?: No Easy bruising?: Yes  Cardiovascular Leg swelling?: No Chest pain?: No  Respiratory Cough?: No Shortness of breath?: No  Endocrine Excessive thirst?: No  Musculoskeletal Back pain?: No Joint  pain?: No  Neurological Headaches?: No Dizziness?: No  Psychologic Depression?: No Anxiety?: Yes  Physical Exam: BP (!) 193/71   Pulse 84   Ht 5' (1.524 m)   Wt 47.2 kg (104 lb)   BMI 20.31 kg/m   Constitutional:  Alert and oriented, No acute distress. HEENT: [place] AT, moist mucus membranes.  Trachea midline, no masses. Cardiovascular: No clubbing, cyanosis, or edema. Respiratory: Normal respiratory effort, no increased work of breathing. GI: Abdomen is soft, nontender, nondistended, no abdominal masses GU: No CVA tenderness. Mild grade 2 hypermobility the bladder neck and a negative cough test. Mild to moderate grade 2 cystocele and grade 1 rectocele. She had vaginal shortening post hysterectomy with vaginal length approximately 7 cm Skin: No rashes, bruises or suspicious lesions. Lymph: No cervical or inguinal adenopathy. Neurologic: Grossly intact, no focal deficits, moving all 4 extremities. Psychiatric: Normal mood and affect.  Laboratory Data: Lab Results  Component Value Date   WBC 5.8 03/29/2016   HGB 15.0 03/29/2016   HCT 45.6 03/29/2016   MCV 95.5 03/29/2016   PLT 226.0 03/29/2016    Lab Results  Component Value Date   CREATININE 0.49 03/22/2016    No results found for: PSA  No results found for: TESTOSTERONE  No results found for: HGBA1C  Urinalysis No results found for: COLORURINE, APPEARANCEUR, LABSPEC, [place], GLUCOSEU, HGBUR, BILIRUBINUR, KETONESUR, PROTEINUR, UROBILINOGEN, NITRITE, LEUKOCYTESUR  Pertinent Imaging: none  Assessment & Plan:  Patient has mixed incontinence but primarily an overactive bladder. She has urge incontinence with mild stress incontinence and fluid dependent urinary frequency.  The patient will be reassessed in 4-6 weeks on Vesicare 5 mg samples and prescription  1. Urge incontinence of urine 2. Urinary frequency - Urinalysis, Complete - Bladder Scan (Post Void Residual) in office   No Follow-up on  file.  Reece Packer, MD  [facility] [address] [place], [place] [address] [phone]

## 2016-06-07 ENCOUNTER — Other Ambulatory Visit: Payer: Self-pay | Admitting: Internal Medicine

## 2016-06-07 ENCOUNTER — Telehealth: Payer: Self-pay | Admitting: *Deleted

## 2016-06-07 DIAGNOSIS — J449 Chronic obstructive pulmonary disease, unspecified: Secondary | ICD-10-CM

## 2016-06-07 NOTE — Telephone Encounter (Signed)
Please contact Pamala Hurry From [facility] to correct a pulmonary function order  Contact [phone]

## 2016-06-07 NOTE — Telephone Encounter (Signed)
Test needs to be under ancillary, not clinic, [facility] only

## 2016-06-07 NOTE — Telephone Encounter (Signed)
Changed order.

## 2016-06-13 ENCOUNTER — Other Ambulatory Visit: Payer: Self-pay | Admitting: Family Medicine

## 2016-06-13 DIAGNOSIS — J42 Unspecified chronic bronchitis: Secondary | ICD-10-CM

## 2016-06-22 ENCOUNTER — Encounter: Payer: Self-pay | Admitting: General Surgery

## 2016-06-22 ENCOUNTER — Ambulatory Visit (INDEPENDENT_AMBULATORY_CARE_PROVIDER_SITE_OTHER): Payer: Medicare HMO | Admitting: General Surgery

## 2016-06-22 VITALS — BP 138/68 | HR 78 | Resp 12 | Ht 60.0 in | Wt 106.0 lb

## 2016-06-22 DIAGNOSIS — K644 Residual hemorrhoidal skin tags: Secondary | ICD-10-CM | POA: Diagnosis not present

## 2016-06-22 NOTE — Patient Instructions (Signed)
The patient is aware to call back for any questions or concerns.  

## 2016-06-22 NOTE — Progress Notes (Signed)
Patient ID: Jane Davis, female   DOB: 1934-01-28, 83 y.o.   MRN: [id number]  Chief Complaint  Patient presents with  . Other    Rectal lesion    HPI Jane Davis is an 83 y.o. female here today for an evaluation of a rectal lesion. Her bowels move daily with the help of prunes. She states the rectal tag has been there for 25 years. Denies any bleeding or pain. I have reviewed the history of present illness with the patient.   HPI  Past Medical History:  Diagnosis Date  . COPD (chronic obstructive pulmonary disease) ([place])   . Essential hypertension 03/22/2016  . Glaucoma   . Hypertension   . Hypothyroidism   . Hypoxia 02/11/2016   Overview:  Discharged from hospital on home oxygen.  . Palpitations 03/22/2016  . Prediabetes 02/19/2015  . Rectal lesion 05/29/2016  . Urge incontinence of urine 05/29/2016    Past Surgical History:  Procedure Laterality Date  . ABDOMINAL HYSTERECTOMY    . COLONOSCOPY  2013   [place]     Family History  Problem Relation Age of Onset  . Intracerebral hemorrhage Mother   . Bladder Cancer Neg Hx   . Prostate cancer Neg Hx     Social History Social History  Substance Use Topics  . Smoking status: Former Smoker    Packs/day: 1.00    Years: 40.00    Types: Cigarettes    Quit date: 01/11/2016  . Smokeless tobacco: Never Used  . Alcohol use No    No Known Allergies  Current Outpatient Prescriptions  Medication Sig Dispense Refill  . albuterol (ACCUNEB) 1.25 MG/3ML nebulizer solution Take 1.25 mg by nebulization every 6 (six) hours as needed.    Marland Kitchen aspirin EC 81 MG tablet Take 81 mg by mouth daily.    Marland Kitchen latanoprost (XALATAN) 0.005 % ophthalmic solution Place 1 drop into both eyes at bedtime.     Marland Kitchen losartan (COZAAR) 50 MG tablet Take 50 mg by mouth daily.    . Timolol Maleate 0.5 % (DAILY) SOLN Place 1 drop into the right eye daily.    Marland Kitchen levothyroxine (SYNTHROID, LEVOTHROID) 100 MCG tablet Take 1 tablet by mouth  daily.     No current facility-administered medications for this visit.     Review of Systems Review of Systems  Constitutional: Negative.   Respiratory: Negative.   Cardiovascular: Negative.   Gastrointestinal: Negative for abdominal pain, constipation and diarrhea.    Blood pressure 138/68, pulse 78, resp. rate 12, height 5' (1.524 m), weight 106 lb (48.1 kg).  Physical Exam Physical Exam  Constitutional: She is oriented to person, place, and time. She appears well-developed and well-nourished.  HENT:  Mouth/Throat: Oropharynx is clear and moist.  Eyes: Conjunctivae are normal. No scleral icterus.  Neck: Neck supple.  Cardiovascular: Normal rate, regular rhythm and normal heart sounds.   Pulmonary/Chest: Effort normal and breath sounds normal.  Abdominal: Soft. Bowel sounds are normal. There is no tenderness.  Genitourinary:  Genitourinary Comments: Rectal- 2cm long skin tag at 3 ocl, tip is fibrosed and has a whitish appearance. Smaller tag at 6 ocl.  Lymphadenopathy:    She has no cervical adenopathy.  Neurological: She is alert and oriented to person, place, and time.  Skin: Skin is warm and dry.  Psychiatric: Her behavior is normal.    Data Reviewed Notes  Assessment      2 external hemorrhoid tags, asymptomatic. No need for intervention.   Plan  Follow up as needed or sooner if symptoms change. The patient is aware to call back for any questions or concerns.      This information has been scribed by Jane Fetch RN, BSN,BC.   Kinleigh Nault G 06/22/2016, 3:10 PM

## 2016-06-26 ENCOUNTER — Encounter: Payer: Self-pay | Admitting: Urology

## 2016-06-26 ENCOUNTER — Ambulatory Visit: Payer: Medicare HMO | Admitting: Urology

## 2016-07-24 ENCOUNTER — Encounter: Payer: Self-pay | Admitting: Urology

## 2016-07-24 ENCOUNTER — Ambulatory Visit: Payer: Medicare HMO | Admitting: Urology

## 2016-07-24 VITALS — BP 179/68 | HR 92 | Ht 60.0 in | Wt 107.0 lb

## 2016-07-24 DIAGNOSIS — N3946 Mixed incontinence: Secondary | ICD-10-CM

## 2016-07-24 NOTE — Progress Notes (Signed)
07/24/2016 1:28 PM   Coriana Angello [birth year] [id number]  Referring provider: Leone Haven, MD [address] [place], [place] [address]  Chief Complaint  Patient presents with  . Urinary Incontinence    4-6wk follow up    HPI: I was consulted to assess the patient's incontinence worsening over 4-5 years. She primarily has urgency incontinence. Sometimes she leaks with coughing and sneezing but not bending and lifting. She wears 2 or 3 pads a day that are quite wet. She does not have bedwetting.  She voids every 1 or 2 hours and does not have nocturia.   Mild grade 2 hypermobility the bladder neck and a negative cough test. Mild to moderate grade 2 cystocele and grade 1 rectocele. She had vaginal shortening post hysterectomy with vaginal length approximately 7 cm  Today Frequency and incontinence are stable. Vesicare cause constipation. I felt she had mixed incontinence but primarily an overactive bladder. She had fluid depended frequency    PMH: Past Medical History:  Diagnosis Date  . COPD (chronic obstructive pulmonary disease) ([place])   . Essential hypertension 03/22/2016  . Glaucoma   . Hypertension   . Hypothyroidism   . Hypoxia 02/11/2016   Overview:  Discharged from hospital on home oxygen.  . Palpitations 03/22/2016  . Prediabetes 02/19/2015  . Rectal lesion 05/29/2016  . Urge incontinence of urine 05/29/2016    Surgical History: Past Surgical History:  Procedure Laterality Date  . ABDOMINAL HYSTERECTOMY    . COLONOSCOPY  2013   [place] Medications:  Allergies as of 07/24/2016   No Known Allergies     Medication List       Accurate as of 07/24/16  1:28 PM. Always use your most recent med list.          albuterol 1.25 MG/3ML nebulizer solution Commonly known as:  ACCUNEB Take 1.25 mg by nebulization every 6 (six) hours as needed.   aspirin EC 81 MG tablet Take 81 mg by mouth daily.   latanoprost 0.005 %  ophthalmic solution Commonly known as:  XALATAN Place 1 drop into both eyes at bedtime.   levothyroxine 100 MCG tablet Commonly known as:  SYNTHROID, LEVOTHROID Take 1 tablet by mouth daily.   losartan 50 MG tablet Commonly known as:  COZAAR Take 50 mg by mouth daily.   Timolol Maleate 0.5 % (DAILY) Soln Place 1 drop into the right eye daily.       Allergies: No Known Allergies  Family History: Family History  Problem Relation Age of Onset  . Intracerebral hemorrhage Mother   . Bladder Cancer Neg Hx   . Prostate cancer Neg Hx     Social History:  reports that she quit smoking about 6 months ago. Her smoking use included Cigarettes. She has a 40.00 pack-year smoking history. She has never used smokeless tobacco. She reports that she does not drink alcohol or use drugs.  ROS: UROLOGY Frequent Urination?: Yes Hard to postpone urination?: No Burning/pain with urination?: No Get up at night to urinate?: No Leakage of urine?: Yes Urine stream starts and stops?: No Trouble starting stream?: No Do you have to strain to urinate?: No Blood in urine?: No Urinary tract infection?: No Sexually transmitted disease?: No Injury to kidneys or bladder?: No Painful intercourse?: No Weak stream?: No Currently pregnant?: No Vaginal bleeding?: No Last menstrual period?: n  Gastrointestinal Nausea?: No Vomiting?: No Indigestion/heartburn?: No Diarrhea?: No Constipation?: Yes  Constitutional Fever: No  Night sweats?: No Weight loss?: No Fatigue?: No  Skin Skin rash/lesions?: No Itching?: No  Eyes Blurred vision?: No Double vision?: No  Ears/Nose/Throat Sore throat?: No Sinus problems?: No  Hematologic/Lymphatic Swollen glands?: No Easy bruising?: Yes  Cardiovascular Leg swelling?: No Chest pain?: No  Respiratory Cough?: No Shortness of breath?: No  Endocrine Excessive thirst?: No  Musculoskeletal Back pain?: No Joint pain?:  No  Neurological Headaches?: No Dizziness?: No  Psychologic Depression?: No Anxiety?: No  Physical Exam: BP (!) 179/68   Pulse 92   Ht 5' (1.524 m)   Wt 107 lb (48.5 kg)   BMI 20.90 kg/m   Constitutional:  Alert and oriented, No acute distress.   Laboratory Data: Lab Results  Component Value Date   WBC 5.8 03/29/2016   HGB 15.0 03/29/2016   HCT 45.6 03/29/2016   MCV 95.5 03/29/2016   PLT 226.0 03/29/2016    Lab Results  Component Value Date   CREATININE 0.49 03/22/2016     Urinalysis    Component Value Date/Time   APPEARANCEUR Clear 06/01/2016 0843   GLUCOSEU Negative 06/01/2016 0843   BILIRUBINUR Negative 06/01/2016 0843   PROTEINUR Negative 06/01/2016 0843   NITRITE Negative 06/01/2016 0843   LEUKOCYTESUR Negative 06/01/2016 0843    Pertinent Imaging: none  Assessment & Plan:  The patient will be tried on the beta 3 agonists and I'll see her back in a month on samples. Because of constipation I did not give Toviaz. Again she does not feel her prolapse but in her abdomen if he is like she is going to fall out and I think she is describing a difference in sensation since her hysterectomy but again she does not feel vaginal bulging sensation. She understands that I will order urodynamics next time if we have not reacher treatment goal. Pathophysiology was discussed.  She does report that when she coughs and sneezes leakage can be mild or moderate  The urodynamics would help sort out the utilization of refractory overactive bladder therapy or a sling  There are no diagnoses linked to this encounter.  Return in about 4 weeks (around 08/21/2016).  Reece Packer, MD  [facility] [address] [place], [place] [address] [phone]

## 2016-08-01 ENCOUNTER — Telehealth: Payer: Self-pay | Admitting: Family Medicine

## 2016-08-01 NOTE — Telephone Encounter (Signed)
fyi

## 2016-08-01 NOTE — Telephone Encounter (Signed)
See [facility] Message would you want pateint to stop Myrbetriq for increased BP until seen in office on Friday, patient has transportation issues. Patient BP today 162/58 today but per chart she has had high reading in the past on 07/24/16  BP 179/68 ?

## 2016-08-01 NOTE — Telephone Encounter (Signed)
Attempted to contact patient to discuss. There is no answering machine set up. Please call her tomorrow and advise her to hold the Myrbetriq until she sees me in the office. Thanks.

## 2016-08-01 NOTE — Telephone Encounter (Signed)
Pt called just to schedule follow up appt. Pt did mention that her BP has been high and all over the place. Pt reading have been 142/56, 154/60, 168/72. I scheduled pt to come in for her follow up and a BP appt as well. Pt was transferred to [facility]. Thank you!

## 2016-08-01 NOTE — Telephone Encounter (Signed)
[facility] Patient Name: Jane Davis DOB: [birth year] Initial Comment Caller states her BP is 142/56. 168/72. She has been taking Myrbetriq. Nurse Assessment Nurse: Markus Daft, RN, Sherre Poot Date/Time (Eastern Time): 08/01/2016 11:28:20 AM Confirm and document reason for call. If symptomatic, describe symptoms. ---Caller states she is not having s/s. Her BP is 142/56 a few days ago. 160/70 in February. She wanted to talk to doctor about it, as urologist gave her a new med, Myrbetriq, for overactive bladder in last week, and can cause her BP to go up. This AM, 162/58. Does the patient have any new or worsening symptoms? ---Yes Will a triage be completed? ---Yes Related visit to physician within the last 2 weeks? ---Yes Does the PT have any chronic conditions? (i.e. diabetes, asthma, etc.) ---Yes List chronic conditions. ---HTN Is this a behavioral health or substance abuse call? ---No Guidelines Guideline Title Affirmed Question Affirmed Notes High Blood Pressure Systolic BP >= 355 OR Diastolic >= 217 Final Disposition User See PCP When Office is Open (within 3 days) Markus Daft, RN, Sherre Poot Comments She has appt on Friday 10:30 am, 08/04/16 with Dr. Caryl Bis. RN tried to get an earlier appt, but pt reports transportation issues. She wanted to move to May, but RN encouraged her to keep the April appt. Disagree/Comply: Comply

## 2016-08-02 NOTE — Telephone Encounter (Signed)
FYI , patient notified stop Myrbetriq until appointment, patient voiced understanding.

## 2016-08-04 ENCOUNTER — Ambulatory Visit: Payer: Medicare HMO | Admitting: Family Medicine

## 2016-08-07 ENCOUNTER — Ambulatory Visit (INDEPENDENT_AMBULATORY_CARE_PROVIDER_SITE_OTHER): Payer: Medicare HMO | Admitting: Family Medicine

## 2016-08-07 ENCOUNTER — Encounter: Payer: Self-pay | Admitting: Family Medicine

## 2016-08-07 VITALS — BP 154/60 | HR 80 | Temp 97.6°F | Wt 109.0 lb

## 2016-08-07 DIAGNOSIS — I10 Essential (primary) hypertension: Secondary | ICD-10-CM | POA: Diagnosis not present

## 2016-08-07 DIAGNOSIS — N3941 Urge incontinence: Secondary | ICD-10-CM

## 2016-08-07 DIAGNOSIS — G629 Polyneuropathy, unspecified: Secondary | ICD-10-CM | POA: Insufficient documentation

## 2016-08-07 NOTE — Progress Notes (Signed)
Pre visit review using our clinic review tool, if applicable. No additional management support is needed unless otherwise documented below in the visit note. 

## 2016-08-07 NOTE — Assessment & Plan Note (Signed)
Tingling in patient's feet consistent with neuropathy particularly given decreased monofilament testing. We'll check a B12 and A1c. Could consider neurology evaluation if those things are negative.

## 2016-08-07 NOTE — Assessment & Plan Note (Signed)
Started on Myrbetriq by urology. This can potentially cause blood pressure to go up though her blood pressure has been fluctuating prior to starting this. I discussed that she could start on it and monitor her blood pressure. If blood pressure goes up she should let us know. She'll follow-up with urology.

## 2016-08-07 NOTE — Patient Instructions (Signed)
Nice to see you. Please continue to monitor your blood pressure once daily. If it is greater than 160/90 please let us know. If it is consistently less than 100/60 please let us know. He should continue your losartan. You can start on the Myrbetriq. Please monitor your feet. If you develop chest pain, shortness breath, or lightheadedness please seek medical attention.

## 2016-08-07 NOTE — Progress Notes (Signed)
  Tommi Rumps, MD Phone: [phone]  Jane Davis is a 83 y.o. female who presents today for follow-up.  Hypertension: Patient reports her blood pressure has been all over the place. She called last week and gave numbers that seemed to be slightly high though somewhat well controlled for her age. Please see the phone note from last week regarding this. She's on losartan. No chest pain or shortness breath. No edema. She does check her blood pressure several times a day.  She was placed on Myrbetriq for urge incontinence by urology. Does note she dribbles some. She had taken several days of this though her blood pressure has been an issue prior to starting this medication. The previous medication she had been on caused constipation.  She notes tingling in her bilateral feet for many years. She is prediabetic. Notes it has not gotten worse. It has not gotten any better. She notes no numbness. No tingling elsewhere.  PMH: Former smoker   ROS see history of present illness  Objective  Physical Exam Vitals:   08/07/16 1516  BP: (!) 154/60  Pulse: 80  Temp: 97.6 F (36.4 C)    BP Readings from Last 3 Encounters:  08/07/16 (!) 154/60  07/24/16 (!) 179/68  06/22/16 138/68   Wt Readings from Last 3 Encounters:  08/07/16 109 lb (49.4 kg)  07/24/16 107 lb (48.5 kg)  06/22/16 106 lb (48.1 kg)    Physical Exam  Constitutional: No distress.  Cardiovascular: Normal rate, regular rhythm and normal heart sounds.   Pulmonary/Chest: Effort normal and breath sounds normal.  Musculoskeletal: She exhibits no edema.  Bilateral feet with 2+ DP and PT pulses, sensation to light touch intact in bilateral feet, decreased monofilament testing bilaterally  Neurological: She is alert. Gait normal.  Skin: Skin is warm and dry. She is not diaphoretic.     Assessment/Plan: Please see individual problem list.  Essential hypertension Pretty well-controlled today for her age. Her  diastolic blood pressures have been borderline low today and at home on review of the prior phone note. Discussed that I would be hesitant to add anything else given her low diastolics. She'll start monitoring once daily at home. She was given parameters to call with. She'll follow-up in one month.  Urge incontinence of urine Started on Myrbetriq by urology. This can potentially cause blood pressure to go up though her blood pressure has been fluctuating prior to starting this. I discussed that she could start on it and monitor her blood pressure. If blood pressure goes up she should let us know. She'll follow-up with urology.  Neuropathy Tingling in patient's feet consistent with neuropathy particularly given decreased monofilament testing. We'll check a B12 and A1c. Could consider neurology evaluation if those things are negative.   Orders Placed This Encounter  Procedures  . B12  . HgB A1c    Tommi Rumps, MD [place]

## 2016-08-07 NOTE — Assessment & Plan Note (Signed)
Pretty well-controlled today for her age. Her diastolic blood pressures have been borderline low today and at home on review of the prior phone note. Discussed that I would be hesitant to add anything else given her low diastolics. She'll start monitoring once daily at home. She was given parameters to call with. She'll follow-up in one month.

## 2016-08-08 LAB — HEMOGLOBIN A1C: HEMOGLOBIN A1C: 5.8 % (ref 4.6–6.5)

## 2016-08-08 LAB — VITAMIN B12: Vitamin B-12: 498 pg/mL (ref 211–911)

## 2016-08-09 ENCOUNTER — Ambulatory Visit: Payer: Medicare HMO | Admitting: Family Medicine

## 2016-08-09 ENCOUNTER — Telehealth: Payer: Self-pay

## 2016-08-09 NOTE — Telephone Encounter (Signed)
Tried calling, no vm 

## 2016-08-09 NOTE — Telephone Encounter (Signed)
-----   Message from Leone Haven, MD sent at 08/08/2016  6:12 PM EDT ----- Please let the patient now she is prediabetic though does not have diabetes. Her B12 is in the normal range. Neither of these things indicate that she has a specific cause for her neuropathy. We could have her see neurology for evaluation if she is willing. Thanks.

## 2016-08-09 NOTE — Telephone Encounter (Signed)
Patient notified and does not want the referral at this time

## 2016-08-09 NOTE — Telephone Encounter (Signed)
Noted  

## 2016-08-14 ENCOUNTER — Encounter: Payer: Self-pay | Admitting: Urology

## 2016-08-14 ENCOUNTER — Ambulatory Visit: Payer: Medicare HMO | Admitting: Urology

## 2016-08-14 VITALS — BP 113/63 | HR 80 | Ht 60.0 in | Wt 108.4 lb

## 2016-08-14 DIAGNOSIS — N3946 Mixed incontinence: Secondary | ICD-10-CM

## 2016-08-14 LAB — BLADDER SCAN AMB NON-IMAGING: SCAN RESULT: 0

## 2016-08-14 MED ORDER — MIRABEGRON ER 50 MG PO TB24: 50.0000 mg | ORAL_TABLET | Freq: Every day | ORAL | 11 refills | Status: DC

## 2016-08-14 NOTE — Progress Notes (Signed)
08/14/2016 2:31 PM   Jane Davis 03-21-1934 [id number]  Referring provider: Leone Haven, MD [address] [place], [place] [address]  Chief Complaint  Patient presents with  . Urinary Incontinence    HPI: I was consulted to assess the patient's incontinence worsening over 4-5 years. She primarily has urgency incontinence. Sometimes she leaks with coughing and sneezing but not bending and lifting. She wears 2 or 3 pads a day that are quite wet. She does not have bedwetting.  She voids every 1 or 2 hours and does not have nocturia.   Mild grade 2 hypermobility the bladder neck and a negative cough test. Mild to moderate grade 2 cystocele and grade 1 rectocele. She had vaginal shortening post hysterectomy with vaginal length approximately 7 cm  Vesicare cause constipation.   The patient will be tried on the beta 3 agonists and I'll see her back in a month on samples. Because of constipation I did not give Toviaz. Again she does not feel her prolapse but in her abdomen if he is like she is going to fall out and I think she is describing a difference in sensation since her hysterectomy but again she does not feel vaginal bulging sensation. She understands that I will order urodynamics next time if we have not reacher treatment goal.   She does report that when she coughs and sneezes leakage can be mild or moderate  The urodynamics would help sort out the utilization of refractory overactive bladder therapy or a sling  Today The patient is going less frequently with less urgency and leaking last wearing 1 pad a day.   PMH: Past Medical History:  Diagnosis Date  . COPD (chronic obstructive pulmonary disease) ([place])   . Essential hypertension 03/22/2016  . Glaucoma   . Hypertension   . Hypothyroidism   . Hypoxia 02/11/2016   Overview:  Discharged from hospital on home oxygen.  . Palpitations 03/22/2016  . Prediabetes 02/19/2015  . Rectal lesion  05/29/2016  . Urge incontinence of urine 05/29/2016    Surgical History: Past Surgical History:  Procedure Laterality Date  . ABDOMINAL HYSTERECTOMY    . COLONOSCOPY  2013   [place] Medications:  Allergies as of 08/14/2016   No Known Allergies     Medication List       Accurate as of 08/14/16  2:31 PM. Always use your most recent med list.          albuterol 1.25 MG/3ML nebulizer solution Commonly known as:  ACCUNEB Take 1.25 mg by nebulization every 6 (six) hours as needed.   aspirin EC 81 MG tablet Take 81 mg by mouth daily.   latanoprost 0.005 % ophthalmic solution Commonly known as:  XALATAN Place 1 drop into both eyes at bedtime.   levothyroxine 100 MCG tablet Commonly known as:  SYNTHROID, LEVOTHROID Take 1 tablet by mouth daily.   losartan 50 MG tablet Commonly known as:  COZAAR Take 50 mg by mouth daily.   Timolol Maleate 0.5 % (DAILY) Soln Place 1 drop into the right eye daily.       Allergies: No Known Allergies  Family History: Family History  Problem Relation Age of Onset  . Intracerebral hemorrhage Mother   . Bladder Cancer Neg Hx   . Prostate cancer Neg Hx     Social History:  reports that she quit smoking about 7 months ago. Her smoking use included Cigarettes. She has a 40.00 pack-year  smoking history. She has never used smokeless tobacco. She reports that she does not drink alcohol or use drugs.  ROS: UROLOGY Frequent Urination?: Yes Hard to postpone urination?: No Burning/pain with urination?: No Get up at night to urinate?: No Leakage of urine?: Yes Urine stream starts and stops?: No Trouble starting stream?: No Do you have to strain to urinate?: No Blood in urine?: No Urinary tract infection?: No Sexually transmitted disease?: No Injury to kidneys or bladder?: No Painful intercourse?: No Weak stream?: No Currently pregnant?: No Vaginal bleeding?: No Last menstrual period?: n  Gastrointestinal Nausea?:  No Vomiting?: No Indigestion/heartburn?: No Diarrhea?: No Constipation?: No  Constitutional Fever: No Night sweats?: No Weight loss?: No Fatigue?: No  Skin Skin rash/lesions?: No Itching?: No  Eyes Blurred vision?: No Double vision?: No  Ears/Nose/Throat Sore throat?: No Sinus problems?: No  Hematologic/Lymphatic Swollen glands?: No Easy bruising?: No  Cardiovascular Leg swelling?: No Chest pain?: No  Respiratory Cough?: No Shortness of breath?: No  Endocrine Excessive thirst?: No  Musculoskeletal Back pain?: No Joint pain?: No  Neurological Headaches?: No Dizziness?: No  Psychologic Depression?: No Anxiety?: No  Physical Exam: BP 113/63 (BP Location: Left Arm, Patient Position: Sitting, Cuff Size: Normal)   Pulse 80   Ht 5' (1.524 m)   Wt 108 lb 6.4 oz (49.2 kg)   BMI 21.17 kg/m    Laboratory Data: Lab Results  Component Value Date   WBC 5.8 03/29/2016   HGB 15.0 03/29/2016   HCT 45.6 03/29/2016   MCV 95.5 03/29/2016   PLT 226.0 03/29/2016    Lab Results  Component Value Date   CREATININE 0.49 03/22/2016    No results found for: PSA  No results found for: TESTOSTERONE  Lab Results  Component Value Date   HGBA1C 5.8 08/07/2016    Urinalysis    Component Value Date/Time   APPEARANCEUR Clear 06/01/2016 0843   GLUCOSEU Negative 06/01/2016 0843   BILIRUBINUR Negative 06/01/2016 0843   PROTEINUR Negative 06/01/2016 0843   NITRITE Negative 06/01/2016 0843   LEUKOCYTESUR Negative 06/01/2016 0843    Pertinent Imaging: none  Assessment & Plan:  Reassess in 3 months on Mirabella trach and reset treatment goal  There are no diagnoses linked to this encounter.  No Follow-up on file.  Reece Packer, MD  [facility] [address], [place], [place] [address] [phone]

## 2016-09-18 ENCOUNTER — Encounter: Payer: Self-pay | Admitting: Family Medicine

## 2016-09-18 ENCOUNTER — Ambulatory Visit (INDEPENDENT_AMBULATORY_CARE_PROVIDER_SITE_OTHER): Payer: Medicare HMO | Admitting: Family Medicine

## 2016-09-18 DIAGNOSIS — J449 Chronic obstructive pulmonary disease, unspecified: Secondary | ICD-10-CM

## 2016-09-18 DIAGNOSIS — N3941 Urge incontinence: Secondary | ICD-10-CM

## 2016-09-18 DIAGNOSIS — E039 Hypothyroidism, unspecified: Secondary | ICD-10-CM | POA: Diagnosis not present

## 2016-09-18 NOTE — Patient Instructions (Signed)
Nice to see you. Please try going to the bathroom to urinate on a schedule every 3 or so hours. This may help with your urgency.  Please monitor your breathing and if it worsens please let us know. Please continue to use albuterol if he needed.

## 2016-09-18 NOTE — Assessment & Plan Note (Signed)
No benefit from the medicine she has tried thus far. She is not interested in trying additional medications. Discussed timed voiding. She'll monitor and follow up with urology.

## 2016-09-18 NOTE — Progress Notes (Signed)
  Tommi Rumps, MD Phone: [phone]  Jane Davis is a 83 y.o. female who presents today for follow-up.  Urge incontinence: The Myrbetriq did not help. She urinate several times during the day. No nocturia. She has urgency if she sits for too long and then gets up. She goes through about 3-4 pads a day. She's not interested in other medications currently.   Hypothyroidism: She is taking Synthroid. Most recent TSH in the normal range. No weight gain. No heat or cold intolerance. No dry skin.  COPD: Patient notes chronic stable mild shortness of breath when it is humid outside. She notes no shortness of breath when it is not humid or if she goes early in the morning to walk. No chest pain. No cough. No wheezing. She occasionally uses her albuterol nebulizer. She is not interested in any other workup or medication for this issue.  PMH: Former smoker   ROS see history of present illness  Objective  Physical Exam Vitals:   09/18/16 1537  BP: (!) 150/70  Pulse: 80  Temp: 98 F (36.7 C)    BP Readings from Last 3 Encounters:  09/18/16 (!) 150/70  08/14/16 113/63  08/07/16 (!) 154/60   Wt Readings from Last 3 Encounters:  09/18/16 109 lb 12.8 oz (49.8 kg)  08/14/16 108 lb 6.4 oz (49.2 kg)  08/07/16 109 lb (49.4 kg)    Physical Exam  Constitutional: No distress.  Cardiovascular: Normal rate, regular rhythm and normal heart sounds.   Pulmonary/Chest: Effort normal and breath sounds normal.  Neurological: She is alert. Gait normal.  Skin: She is not diaphoretic.     Assessment/Plan: Please see individual problem list.  COPD (chronic obstructive pulmonary disease) ([facility]) History of COPD. Not on any controller medications. She notes chronic stable dyspnea if it is humid outside. No issues currently. Offered to obtain PFTs versus start on controller medicine though she declined and did not want to do anything for this issue. She'll continue to monitor and if  worsen she'll let us know.  Hypothyroidism Stable. Most recent TSH in the normal range. Continue to monitor. Continue Synthroid.  Urge incontinence of urine No benefit from the medicine she has tried thus far. She is not interested in trying additional medications. Discussed timed voiding. She'll monitor and follow up with urology.  Tommi Rumps, MD [place]

## 2016-09-18 NOTE — Assessment & Plan Note (Signed)
History of COPD. Not on any controller medications. She notes chronic stable dyspnea if it is humid outside. No issues currently. Offered to obtain PFTs versus start on controller medicine though she declined and did not want to do anything for this issue. She'll continue to monitor and if worsen she'll let us know.

## 2016-09-18 NOTE — Assessment & Plan Note (Signed)
Stable. Most recent TSH in the normal range. Continue to monitor. Continue Synthroid.

## 2016-11-08 ENCOUNTER — Telehealth: Payer: Self-pay | Admitting: Urology

## 2016-11-08 NOTE — Telephone Encounter (Signed)
Patient called and said she was not going to take the medication and she was never coming back.  Sharyn Lull

## 2016-11-13 ENCOUNTER — Ambulatory Visit: Payer: Medicare HMO

## 2016-11-20 ENCOUNTER — Other Ambulatory Visit: Payer: Self-pay | Admitting: Family Medicine

## 2016-11-20 MED ORDER — LEVOTHYROXINE SODIUM 100 MCG PO TABS: 100.0000 ug | ORAL_TABLET | Freq: Every day | ORAL | 3 refills | Status: DC

## 2016-11-20 NOTE — Telephone Encounter (Signed)
Please advise all under historical

## 2016-11-20 NOTE — Telephone Encounter (Signed)
Pt needs a refill on levothyroxine (SYNTHROID, LEVOTHROID) 100 MCG tablet sent to [facility]. Please advise

## 2016-11-20 NOTE — Telephone Encounter (Signed)
Last OV 09/18/16 filed under historical

## 2016-11-20 NOTE — Telephone Encounter (Signed)
Pt  Now stated that she needs Dr. Caryl Bis to refill ALL her RX and send to [facility]

## 2016-11-20 NOTE — Telephone Encounter (Signed)
Please confirm what she means by all her medications. There are several eyedrops that should be refilled through her ophthalmologist. Please see which other medications she needs me to refill. Thanks.

## 2016-11-21 NOTE — Telephone Encounter (Signed)
Patient does not need refills at this time

## 2016-12-21 ENCOUNTER — Other Ambulatory Visit: Payer: Self-pay

## 2016-12-21 DIAGNOSIS — I10 Essential (primary) hypertension: Secondary | ICD-10-CM

## 2016-12-21 MED ORDER — LOSARTAN POTASSIUM 50 MG PO TABS: 50.0000 mg | ORAL_TABLET | Freq: Every day | ORAL | 1 refills | Status: DC

## 2016-12-21 NOTE — Telephone Encounter (Signed)
Last OV 09/18/16 filed under historical

## 2016-12-21 NOTE — Telephone Encounter (Signed)
Sent to pharmacy. Needs BMET in the next month. Order placed.

## 2016-12-22 NOTE — Telephone Encounter (Signed)
Left message to return call 

## 2016-12-28 NOTE — Telephone Encounter (Signed)
Patient scheduled.

## 2017-01-11 ENCOUNTER — Telehealth: Payer: Self-pay | Admitting: Family Medicine

## 2017-01-11 ENCOUNTER — Other Ambulatory Visit (INDEPENDENT_AMBULATORY_CARE_PROVIDER_SITE_OTHER): Payer: Medicare HMO

## 2017-01-11 DIAGNOSIS — I10 Essential (primary) hypertension: Secondary | ICD-10-CM | POA: Diagnosis not present

## 2017-01-11 LAB — BASIC METABOLIC PANEL
BUN: 11 mg/dL (ref 6–23)
CHLORIDE: 98 meq/L (ref 96–112)
CO2: 32 mEq/L (ref 19–32)
CREATININE: 0.57 mg/dL (ref 0.40–1.20)
Calcium: 10.2 mg/dL (ref 8.4–10.5)
GFR: 108.01 mL/min (ref >60.00)
GLUCOSE: 159 mg/dL — AB (ref 70–99)
Potassium: 4.3 mEq/L (ref 3.5–5.1)
Sodium: 139 mEq/L (ref 135–145)

## 2017-01-11 MED ORDER — LOSARTAN POTASSIUM 50 MG PO TABS: 50.0000 mg | ORAL_TABLET | Freq: Every day | ORAL | 1 refills | Status: DC

## 2017-01-11 NOTE — Telephone Encounter (Signed)
Sent to pharmacy 

## 2017-01-11 NOTE — Telephone Encounter (Signed)
Pt states that she needs a refill on losartan (COZAAR) 50 MG tablet sent to [facility]. She will be leaving to go to [place] on 10/9/201/ till 02/26/17 and she will be out while she is there

## 2017-01-12 ENCOUNTER — Telehealth: Payer: Self-pay | Admitting: Radiology

## 2017-01-12 ENCOUNTER — Other Ambulatory Visit (INDEPENDENT_AMBULATORY_CARE_PROVIDER_SITE_OTHER): Payer: Medicare HMO

## 2017-01-12 DIAGNOSIS — R7309 Other abnormal glucose: Secondary | ICD-10-CM

## 2017-01-12 LAB — HEMOGLOBIN A1C: Hgb A1c MFr Bld: 5.8 % (ref 4.6–6.5)

## 2017-01-12 NOTE — Addendum Note (Signed)
Addended by: Arby Barrette on: 01/12/2017 11:35 AM   Modules accepted: Orders

## 2017-01-12 NOTE — Telephone Encounter (Signed)
Please place order.

## 2017-01-12 NOTE — Telephone Encounter (Signed)
Pt coming in for labs today , please place future orders. Thank you.

## 2017-01-12 NOTE — Telephone Encounter (Signed)
Pt came in for labs. Reviewed result note and drew pt for A1C. Please place future orders. Thank you.

## 2017-01-12 NOTE — Telephone Encounter (Signed)
Disregard message. A1C orders placed with dx elevated glucose.

## 2017-01-15 ENCOUNTER — Telehealth: Payer: Self-pay | Admitting: Family Medicine

## 2017-01-15 NOTE — Telephone Encounter (Signed)
Patient spoke with kristin

## 2017-01-15 NOTE — Telephone Encounter (Signed)
Pt lvm stating that she is returning a call. No other messages in her chart.

## 2017-03-20 ENCOUNTER — Ambulatory Visit: Payer: Medicare HMO | Admitting: Family Medicine

## 2017-04-28 ENCOUNTER — Other Ambulatory Visit: Payer: Self-pay | Admitting: Family Medicine

## 2017-05-02 ENCOUNTER — Other Ambulatory Visit: Payer: Self-pay

## 2017-05-02 MED ORDER — LOSARTAN POTASSIUM 50 MG PO TABS: 50.0000 mg | ORAL_TABLET | Freq: Every day | ORAL | 4 refills | Status: DC

## 2017-05-08 ENCOUNTER — Telehealth: Payer: Self-pay | Admitting: Family Medicine

## 2017-05-08 DIAGNOSIS — I10 Essential (primary) hypertension: Secondary | ICD-10-CM

## 2017-05-08 MED ORDER — OLMESARTAN MEDOXOMIL 20 MG PO TABS: 20.0000 mg | ORAL_TABLET | Freq: Every day | ORAL | 1 refills | Status: DC

## 2017-05-08 NOTE — Telephone Encounter (Signed)
Patient notified and scheduled 

## 2017-05-08 NOTE — Telephone Encounter (Signed)
Please advise 

## 2017-05-08 NOTE — Telephone Encounter (Signed)
Copied from [place] [phone]. Topic: Quick Communication - Rx Refill/Question >> May 08, 2017 10:17 AM Cecelia Byars, NT wrote: The patient called and said the pharmacy sent her a letter saying that they will no longer dispense losartan for 25, 50, and 100 mg, she wants to know if she should continue to take the medication, please advise [phone]

## 2017-05-08 NOTE — Telephone Encounter (Signed)
She should not continue this.  I sent in a new prescription for olmesartan for her to start on for her blood pressure.  She will need a BMP in 1 week.  Order has been placed.  Thanks.

## 2017-05-15 ENCOUNTER — Other Ambulatory Visit (INDEPENDENT_AMBULATORY_CARE_PROVIDER_SITE_OTHER): Payer: Medicare HMO

## 2017-05-15 DIAGNOSIS — I10 Essential (primary) hypertension: Secondary | ICD-10-CM

## 2017-05-15 LAB — BASIC METABOLIC PANEL
BUN: 8 mg/dL (ref 6–23)
CALCIUM: 9.5 mg/dL (ref 8.4–10.5)
CO2: 34 meq/L — AB (ref 19–32)
CREATININE: 0.54 mg/dL (ref 0.40–1.20)
Chloride: 101 mEq/L (ref 96–112)
GFR: 114.87 mL/min (ref >60.00)
Glucose, Bld: 128 mg/dL — ABNORMAL HIGH (ref 70–99)
Potassium: 4.5 mEq/L (ref 3.5–5.1)
SODIUM: 141 meq/L (ref 135–145)

## 2017-05-25 ENCOUNTER — Other Ambulatory Visit: Payer: Self-pay

## 2017-05-25 ENCOUNTER — Ambulatory Visit: Payer: Medicare HMO

## 2017-05-25 ENCOUNTER — Ambulatory Visit (INDEPENDENT_AMBULATORY_CARE_PROVIDER_SITE_OTHER): Payer: Medicare HMO | Admitting: Family Medicine

## 2017-05-25 ENCOUNTER — Encounter: Payer: Self-pay | Admitting: Family Medicine

## 2017-05-25 VITALS — BP 162/68 | HR 71 | Temp 97.3°F | Wt 110.8 lb

## 2017-05-25 DIAGNOSIS — R7303 Prediabetes: Secondary | ICD-10-CM

## 2017-05-25 DIAGNOSIS — J449 Chronic obstructive pulmonary disease, unspecified: Secondary | ICD-10-CM

## 2017-05-25 DIAGNOSIS — E039 Hypothyroidism, unspecified: Secondary | ICD-10-CM

## 2017-05-25 DIAGNOSIS — J069 Acute upper respiratory infection, unspecified: Secondary | ICD-10-CM | POA: Diagnosis not present

## 2017-05-25 DIAGNOSIS — I10 Essential (primary) hypertension: Secondary | ICD-10-CM

## 2017-05-25 NOTE — Progress Notes (Signed)
  Tommi Rumps, MD Phone: [phone]  Jane Davis is a 84 y.o. female who presents today for f/u.  HYPERTENSION  Disease Monitoring  Home BP Monitoring not checking Chest pain- no    Dyspnea- no Medications  Compliance-  Taking olmesartan.  Edema- no  HYPOTHYROIDISM Disease Monitoring Weight changes: no  Skin Changes: no  Heat/Cold intolerance: no  Medication Monitoring Compliance:  Taking synthroid   Last TSH:   Lab Results  Component Value Date   TSH 0.48 03/22/2016   COPD: Rarely uses her albuterol.  No cough or wheezing consistently.  Does note she think she is getting a little bit of a cold.  Has had some postnasal drip and some sinus congestion.  No fevers.  Is taking DayQuil with some good benefit.   Social History   Tobacco Use  Smoking Status Former Smoker  . Packs/day: 1.00  . Years: 40.00  . Pack years: 40.00  . Types: Cigarettes  . Last attempt to quit: 01/11/2016  . Years since quitting: 1.3  Smokeless Tobacco Never Used     ROS see history of present illness  Objective  Physical Exam Vitals:   05/25/17 1553  BP: (!) 162/68  Pulse: 71  Temp: (!) 97.3 F (36.3 C)  SpO2: 94%    BP Readings from Last 3 Encounters:  05/25/17 (!) 162/68  09/18/16 (!) 150/70  08/14/16 113/63   Wt Readings from Last 3 Encounters:  05/25/17 110 lb 12.8 oz (50.3 kg)  09/18/16 109 lb 12.8 oz (49.8 kg)  08/14/16 108 lb 6.4 oz (49.2 kg)    Physical Exam  Constitutional: No distress.  HENT:  Head: Normocephalic and atraumatic.  Mouth/Throat: Oropharynx is clear and moist. No oropharyngeal exudate.  Eyes: Conjunctivae are normal. Pupils are equal, round, and reactive to light.  Neck: Neck supple. No thyromegaly present.  Cardiovascular: Normal rate, regular rhythm and normal heart sounds.  Pulmonary/Chest: Effort normal and breath sounds normal.  Musculoskeletal: She exhibits no edema.  Lymphadenopathy:    She has no cervical adenopathy.    Neurological: She is alert. Gait normal.  Skin: Skin is warm and dry. She is not diaphoretic.     Assessment/Plan: Please see individual problem list.  Essential hypertension Above goal.  We recently switched her losartan given the recall.  Discussed increasing the olmesartan though she declined this.  She wants to work on exercise changes.  She will return in 1 month for nurse BP check.  COPD (chronic obstructive pulmonary disease) ([place]) Well-controlled.  She will monitor.  Hypothyroidism Check TSH.  Continue Synthroid.  Prediabetes Check A1c.  URI (upper respiratory infection) Upper respiratory symptoms most consistent with URI.  She will continue supportive care.   Orders Placed This Encounter  Procedures  . HgB A1c  . TSH    No orders of the defined types were placed in this encounter.    Tommi Rumps, MD [place]

## 2017-05-25 NOTE — Assessment & Plan Note (Signed)
Well-controlled.  She will monitor.

## 2017-05-25 NOTE — Assessment & Plan Note (Signed)
Above goal.  We recently switched her losartan given the recall.  Discussed increasing the olmesartan though she declined this.  She wants to work on exercise changes.  She will return in 1 month for nurse BP check.

## 2017-05-25 NOTE — Assessment & Plan Note (Signed)
Check A1c. 

## 2017-05-25 NOTE — Assessment & Plan Note (Signed)
Upper respiratory symptoms most consistent with URI.  She will continue supportive care.

## 2017-05-25 NOTE — Patient Instructions (Signed)
Nice to see you. We will get lab work today and contact you with the results. Please monitor your cough and drainage and if it worsens please let us know.

## 2017-05-25 NOTE — Assessment & Plan Note (Signed)
Check TSH.  Continue Synthroid. 

## 2017-05-26 LAB — HEMOGLOBIN A1C
EAG (MMOL/L): 6.3 (calc)
HEMOGLOBIN A1C: 5.6 %{Hb} (ref <5.7)
MEAN PLASMA GLUCOSE: 114 (calc)

## 2017-05-26 LAB — TSH: TSH: 0.09 m[IU]/L — AB (ref 0.40–4.50)

## 2017-05-28 ENCOUNTER — Other Ambulatory Visit: Payer: Self-pay | Admitting: Family Medicine

## 2017-05-28 DIAGNOSIS — E039 Hypothyroidism, unspecified: Secondary | ICD-10-CM

## 2017-05-28 MED ORDER — LEVOTHYROXINE SODIUM 88 MCG PO TABS: 88.0000 ug | ORAL_TABLET | Freq: Every day | ORAL | 3 refills | Status: DC

## 2017-05-28 MED ORDER — ALBUTEROL SULFATE 1.25 MG/3ML IN NEBU: 1.2500 mg | INHALATION_SOLUTION | Freq: Four times a day (QID) | RESPIRATORY_TRACT | 2 refills | Status: DC | PRN

## 2017-05-28 NOTE — Telephone Encounter (Signed)
Last OV 05/25/17 last filled 02/21/18 1.25 mg

## 2017-05-28 NOTE — Telephone Encounter (Signed)
Copied from [place]. Topic: Quick Communication - Rx Refill/Question >> May 28, 2017 11:36 AM Margot Ables wrote: Medication: albuterol (ACCUNEB) 1.25 MG/3ML nebulizer solution - pt meds are expired Has the patient contacted their pharmacy? No. Bc she hasn't gotten in so long Preferred Pharmacy (with phone number or street name): [place], [place] [phone] (Phone) [phone] (Fax)  Agent: Please be advised that RX refills may take up to 3 business days. We ask that you follow-up with your pharmacy.

## 2017-05-30 ENCOUNTER — Telehealth: Payer: Self-pay | Admitting: Family Medicine

## 2017-05-30 NOTE — Telephone Encounter (Signed)
Ok to change

## 2017-05-30 NOTE — Telephone Encounter (Signed)
It is ok to change this. Thanks.

## 2017-05-30 NOTE — Telephone Encounter (Signed)
Please advise 

## 2017-05-30 NOTE — Telephone Encounter (Signed)
Copied from [place]. Topic: Quick Communication - See Telephone Encounter >> May 30, 2017 10:19 AM Synthia Innocent wrote: CRM for notification. See Telephone encounter for: patient states she [place] unable to take generic, levothyroxine (SYNTHROID, LEVOTHROID) 88 MCG tablet, needs name brand. Pharmacy states they can change it, she just wanted to let provider know.  05/30/17.

## 2017-05-30 NOTE — Telephone Encounter (Signed)
Pharmacy states she has been receiving name brand

## 2017-06-14 ENCOUNTER — Ambulatory Visit (INDEPENDENT_AMBULATORY_CARE_PROVIDER_SITE_OTHER): Payer: Medicare HMO

## 2017-06-14 VITALS — BP 128/68 | HR 75 | Temp 98.2°F | Resp 14 | Ht 58.5 in | Wt 108.1 lb

## 2017-06-14 DIAGNOSIS — Z0000 Encounter for general adult medical examination without abnormal findings: Secondary | ICD-10-CM | POA: Diagnosis not present

## 2017-06-14 DIAGNOSIS — Z23 Encounter for immunization: Secondary | ICD-10-CM | POA: Diagnosis not present

## 2017-06-14 NOTE — Patient Instructions (Addendum)
  Jane Davis , Thank you for taking time to come for your Medicare Wellness Visit. I appreciate your ongoing commitment to your health goals. Please review the following plan we discussed and let me know if I can assist you in the future.   These are the goals we discussed: Goals    . Increase physical activity     Walk for exercise        This is a list of the screening recommended for you and due dates:  Health Maintenance  Topic Date Due  . Tetanus Vaccine  05/07/1952  . DEXA scan (bone density measurement)  05/07/2000  . Pneumonia vaccines (1 of 2 - PCV13) 05/07/2000  . Flu Shot  Completed   Bone Densitometry Bone densitometry is an imaging test that uses a special X-ray to measure the amount of calcium and other minerals in your bones (bone density). This test is also known as a bone mineral density test or dual-energy X-ray absorptiometry (DXA). The test can measure bone density at your hip and your spine. It is similar to having a regular X-ray. You may have this test to:  Diagnose a condition that causes weak or thin bones (osteoporosis).  Predict your risk of a broken bone (fracture).  Determine how well osteoporosis treatment is working.  Tell a health care provider about:  Any allergies you have.  All medicines you are taking, including vitamins, herbs, eye drops, creams, and over-the-counter medicines.  Any problems you or family members have had with anesthetic medicines.  Any blood disorders you have.  Any surgeries you have had.  Any medical conditions you have.  Possibility of pregnancy.  Any other medical test you had within the previous 14 days that used contrast material. What are the risks? Generally, this is a safe procedure. However, problems can occur and may include the following:  This test exposes you to a very small amount of radiation.  The risks of radiation exposure may be greater to unborn children.  What happens before the  procedure?  Do not take any calcium supplements for 24 hours before having the test. You can otherwise eat and drink what you usually do.  Take off all metal jewelry, eyeglasses, dental appliances, and any other metal objects. What happens during the procedure?  You may lie on an exam table. There will be an X-ray generator below you and an imaging device above you.  Other devices, such as boxes or braces, may be used to position your body properly for the scan.  You will need to lie still while the machine slowly scans your body.  The images will show up on a computer monitor. What happens after the procedure? You may need more testing at a later time. This information is not intended to replace advice given to you by your health care provider. Make sure you discuss any questions you have with your health care provider. Document Released: 04/18/2004 Document Revised: 09/02/2015 Document Reviewed: 09/04/2013 Elsevier Interactive Patient Education  2018 [employer].

## 2017-06-14 NOTE — Progress Notes (Signed)
Subjective:   Jane Davis is a 84 y.o. female who presents for an Initial Medicare Annual Wellness Visit.  Review of Systems    No ROS.  Medicare Wellness Visit. Additional risk factors are reflected in the social history.  Cardiac Risk Factors include: advanced age (>24men, >59 women);hypertension     Objective:    Today's Vitals   06/14/17 1500  BP: 128/68  Pulse: 75  Resp: 14  Temp: 98.2 F (36.8 C)  TempSrc: Oral  SpO2: 95%  Weight: 108 lb 1.9 oz (49 kg)  Height: 4' 10.5" (1.486 m)   Body mass index is 22.21 kg/m.  Advanced Directives 06/14/2017 02/07/2016  Does Patient Have a Medical Advance Directive? Yes No  Type of Advance Directive [place] -  Does patient want to make changes to medical advance directive? No - Patient declined -  Copy of [place] in Chart? No - copy requested -  Would patient like information on creating a medical advance directive? - Yes - Educational materials given    Current Medications (verified) Outpatient Encounter Medications as of 06/14/2017  Medication Sig  . albuterol (ACCUNEB) 1.25 MG/3ML nebulizer solution Take 3 mLs (1.25 mg total) by nebulization every 6 (six) hours as needed.  Marland Kitchen aspirin EC 81 MG tablet Take 81 mg by mouth daily.  Marland Kitchen latanoprost (XALATAN) 0.005 % ophthalmic solution Place 1 drop into both eyes at bedtime.   Marland Kitchen levothyroxine (SYNTHROID, LEVOTHROID) 88 MCG tablet Take 1 tablet (88 mcg total) by mouth daily.  Marland Kitchen olmesartan (BENICAR) 20 MG tablet Take 1 tablet (20 mg total) by mouth daily.  . Timolol Maleate 0.5 % (DAILY) SOLN Place 1 drop into the right eye daily.   No facility-administered encounter medications on file as of 06/14/2017.     Allergies (verified) Patient has no known allergies.   History: Past Medical History:  Diagnosis Date  . COPD (chronic obstructive pulmonary disease) ([place])   . Essential hypertension 03/22/2016  . Glaucoma   .  Hypertension   . Hypothyroidism   . Hypoxia 02/11/2016   Overview:  Discharged from hospital on home oxygen.  . Palpitations 03/22/2016  . Prediabetes 02/19/2015  . Rectal lesion 05/29/2016  . Urge incontinence of urine 05/29/2016   Past Surgical History:  Procedure Laterality Date  . ABDOMINAL HYSTERECTOMY    . COLONOSCOPY  2013   [place]    Family History  Problem Relation Age of Onset  . Intracerebral hemorrhage Mother   . Cancer Sister   . Cancer Brother   . Bladder Cancer Neg Hx   . Prostate cancer Neg Hx    Social History   Socioeconomic History  . Marital status: Widowed    Spouse name: None  . Number of children: None  . Years of education: None  . Highest education level: None  Social Needs  . Financial resource strain: Not hard at all  . Food insecurity - worry: Never true  . Food insecurity - inability: Never true  . Transportation needs - medical: No  . Transportation needs - non-medical: No  Occupational History  . None  Tobacco Use  . Smoking status: Former Smoker    Packs/day: 1.00    Years: 40.00    Pack years: 40.00    Types: Cigarettes    Last attempt to quit: 01/11/2016    Years since quitting: 1.4  . Smokeless tobacco: Never Used  Substance and Sexual Activity  . Alcohol use: No  .  Drug use: No  . Sexual activity: No  Other Topics Concern  . None  Social History Narrative  . None    Tobacco Counseling Counseling given: Not Answered   Clinical Intake:  Pre-visit preparation completed: Yes  Pain : No/denies pain     Nutritional Status: BMI of 19-24  Normal Diabetes: No(Prediabetic, followed by PCP)  How often do you need to have someone help you when you read instructions, pamphlets, or other written materials from your doctor or pharmacy?: 1 - Never  Interpreter Needed?: No      Activities of Daily Living In your present state of health, do you have any difficulty performing the following activities: 06/14/2017  Hearing?  N  Vision? N  Difficulty concentrating or making decisions? Y  Comment Difficulty remembering   Walking or climbing stairs? N  Dressing or bathing? N  Doing errands, shopping? N  Preparing Food and eating ? N  Using the Toilet? N  In the past six months, have you accidently leaked urine? Y  Comment Managed with a daily pad  Do you have problems with loss of bowel control? N  Managing your Medications? N  Managing your Finances? N  Housekeeping or managing your Housekeeping? N  Some recent data might be hidden     Immunizations and Health Maintenance Immunization History  Administered Date(s) Administered  . Influenza-Unspecified 01/10/2016  . Pneumococcal Conjugate-13 06/14/2017   Health Maintenance Due  Topic Date Due  . TETANUS/TDAP  05[birth year]07/1954  . DEXA SCAN  05/07/2000    Patient Care Team: Leone Haven, MD as PCP - General (Family Medicine) Leone Haven, MD as Consulting Physician (Family Medicine) Christene Lye, MD (General Surgery)  Indicate any recent Medical Services you may have received from other than [facility] providers in the past year (date may be approximate).     Assessment:   This is a routine wellness examination for Jane Davis.  The goal of the wellness visit is to assist the patient how to close the gaps in care and create a preventative care plan for the patient.   The roster of all physicians providing medical care to patient is listed in the Snapshot section of the chart.  Osteoporosis risk reviewed.    Safety issues reviewed; Smoke and carbon monoxide detectors in the home. No firearms or firearms locked in a safe within the home. Wears seatbelts when driving or riding with others. No violence in the home.  They do not have excessive sun exposure.  Discussed the need for sun protection: hats, long sleeves and the use of sunscreen if there is significant sun exposure.  Patient is alert, normal appearance, oriented to  person/place/and time. Correctly identified the president of the [place] and recalls of 1/3 words.  Performs simple calculations and can read correct time from watch face. Displays appropriate judgement.  No new identified risk were noted.  No failures at ADL's or IADL's.    BMI- discussed the importance of a healthy diet, water intake and the benefits of aerobic exercise.   24 hour diet recall: Regular diet  Dental- UTD  Eye- Visual acuity not assessed per patient preference since they have regular follow up with the ophthalmologist.  Wears corrective lenses when reading.    Sleep patterns- Sleeps 6-8 hours at night.  Wakes feeling rested.  Prevnar 13 vaccine administered R deltoid, tolerated well. Educational material provided.  Dexa Scan declined.  TDAP vaccine deferred per patient preference.  Follow up with insurance.  Educational material provided.  Patient Concerns: None at this time. Follow up with PCP as needed.  Hearing/Vision screen Hearing Screening Comments: Patient is able to hear conversational tones without difficulty.  No issues reported.   Vision Screening Comments: Followed by [facility] Wears corrective lenses Last OV 2018 Lasik surgery Glaucoma; drops in use Cataracts extracted, bilateral Visual acuity not assessed per patient preference since they have regular follow up with the ophthalmologist  Dietary issues and exercise activities discussed: Current Exercise Habits: The patient does not participate in regular exercise at present  Goals    . Increase physical activity     Walk for exercise 5 days weekly, 30 minutes       Depression Screen PHQ 2/9 Scores 05/25/2017 03/22/2016  PHQ - 2 Score 0 0    Fall Risk Fall Risk  05/25/2017 03/22/2016  Falls in the past year? No No   Cognitive Function: MMSE - Mini Mental State Exam 06/14/2017  Orientation to time 5  Orientation to Place 5  Registration 3  Attention/ Calculation 4    Attention/Calculation-comments Some difficulty with calculation  Recall 1  Recall-comments 1 out of 3 words remembered  Language- name 2 objects 2  Language- repeat 1  Language- follow 3 step command 3  Language- read & follow direction 1  Write a sentence 1  Copy design 1  Total score 27        Screening Tests Health Maintenance  Topic Date Due  . TETANUS/TDAP  05[birth year]07/1954  . DEXA SCAN  05/07/2000  . PNA vac Low Risk Adult (2 of 2 - PPSV23) 06/15/2018  . INFLUENZA VACCINE  Completed     Plan:    End of life planning; Advance aging; Advanced directives discussed. Copy of current HCPOA/Living Will requested.    I have personally reviewed and noted the following in the patient's chart:   . Medical and social history . Use of alcohol, tobacco or illicit drugs  . Current medications and supplements . Functional ability and status . Nutritional status . Physical activity . Advanced directives . List of other physicians . Hospitalizations, surgeries, and ER visits in previous 12 months . Vitals . Screenings to include cognitive, depression, and falls . Referrals and appointments  In addition, I have reviewed and discussed with patient certain preventive protocols, quality metrics, and best practice recommendations. A written personalized care plan for preventive services as well as general preventive health recommendations were provided to patient.     Varney Biles, LPN   05/14/4626    Reviewed above information.  Agree with assessment and plan.    Dr Nicki Reaper

## 2017-06-29 ENCOUNTER — Other Ambulatory Visit (INDEPENDENT_AMBULATORY_CARE_PROVIDER_SITE_OTHER): Payer: Medicare HMO

## 2017-06-29 DIAGNOSIS — E039 Hypothyroidism, unspecified: Secondary | ICD-10-CM | POA: Diagnosis not present

## 2017-06-29 LAB — TSH: TSH: 0.2 u[IU]/mL — AB (ref 0.35–4.50)

## 2017-07-03 ENCOUNTER — Telehealth: Payer: Self-pay | Admitting: Family Medicine

## 2017-07-03 DIAGNOSIS — E039 Hypothyroidism, unspecified: Secondary | ICD-10-CM

## 2017-07-03 MED ORDER — LEVOTHYROXINE SODIUM 75 MCG PO TABS: 75.0000 ug | ORAL_TABLET | Freq: Every day | ORAL | 3 refills | Status: DC

## 2017-07-03 NOTE — Telephone Encounter (Signed)
See result note previously.  TSH ordered for follow-up.

## 2017-07-03 NOTE — Telephone Encounter (Signed)
Lab scheduled and 75 mcg of levothyroxine sent to pharmacy.

## 2017-07-30 ENCOUNTER — Other Ambulatory Visit (INDEPENDENT_AMBULATORY_CARE_PROVIDER_SITE_OTHER): Payer: Medicare HMO

## 2017-07-30 DIAGNOSIS — E039 Hypothyroidism, unspecified: Secondary | ICD-10-CM | POA: Diagnosis not present

## 2017-07-30 LAB — TSH: TSH: 0.81 u[IU]/mL (ref 0.35–4.50)

## 2017-07-31 ENCOUNTER — Telehealth: Payer: Self-pay | Admitting: Family Medicine

## 2017-07-31 NOTE — Telephone Encounter (Signed)
Pt given lab results per notes of Dr. Caryl Bis on 07/31/17. Pt verbalized understanding. Unable to chart in result notes due to result note not routed to [facility].

## 2017-07-31 NOTE — Telephone Encounter (Signed)
Her TSH is normal.  She should continue the 75 mcg of levothyroxine.

## 2017-07-31 NOTE — Telephone Encounter (Signed)
It looks like patient had these labs done yesterday and Dr. Caryl Bis has not resulted them yet.

## 2017-07-31 NOTE — Telephone Encounter (Signed)
Copied from [place] [id number]. Topic: Inquiry >> Jul 31, 2017 12:40 PM Margot Ables wrote: Reason for CRM: pt calling to check status of lab results. She is going out of town tomorrow and needs to get her prescription. Please call patient.

## 2017-07-31 NOTE — Telephone Encounter (Signed)
Tried calling, no voicemail. [place] for pec to speak to patient about results

## 2017-07-31 NOTE — Telephone Encounter (Signed)
Please advise 

## 2017-08-22 ENCOUNTER — Ambulatory Visit: Payer: Medicare HMO | Admitting: Family Medicine

## 2017-10-01 ENCOUNTER — Ambulatory Visit (INDEPENDENT_AMBULATORY_CARE_PROVIDER_SITE_OTHER): Payer: Medicare HMO | Admitting: Family Medicine

## 2017-10-01 ENCOUNTER — Encounter: Payer: Self-pay | Admitting: Family Medicine

## 2017-10-01 VITALS — BP 110/60 | HR 70 | Temp 97.6°F | Ht 59.0 in | Wt 112.4 lb

## 2017-10-01 DIAGNOSIS — G629 Polyneuropathy, unspecified: Secondary | ICD-10-CM

## 2017-10-01 DIAGNOSIS — E039 Hypothyroidism, unspecified: Secondary | ICD-10-CM

## 2017-10-01 DIAGNOSIS — I10 Essential (primary) hypertension: Secondary | ICD-10-CM | POA: Diagnosis not present

## 2017-10-01 LAB — COMPREHENSIVE METABOLIC PANEL
ALT: 10 U/L (ref 0–35)
AST: 15 U/L (ref 0–37)
Albumin: 4.5 g/dL (ref 3.5–5.2)
Alkaline Phosphatase: 83 U/L (ref 39–117)
BUN: 15 mg/dL (ref 6–23)
CALCIUM: 9.7 mg/dL (ref 8.4–10.5)
CHLORIDE: 101 meq/L (ref 96–112)
CO2: 32 meq/L (ref 19–32)
CREATININE: 0.59 mg/dL (ref 0.40–1.20)
GFR: 103.61 mL/min (ref >60.00)
Glucose, Bld: 87 mg/dL (ref 70–99)
POTASSIUM: 4.4 meq/L (ref 3.5–5.1)
Sodium: 140 mEq/L (ref 135–145)
Total Bilirubin: 0.4 mg/dL (ref 0.2–1.2)
Total Protein: 6.8 g/dL (ref 6.0–8.3)

## 2017-10-01 LAB — TSH: TSH: 3.79 u[IU]/mL (ref 0.35–4.50)

## 2017-10-01 NOTE — Progress Notes (Signed)
  Tommi Rumps, MD Phone: [phone]  Jane Davis is a 84 y.o. female who presents today for f/u.  CC: htn, hypothyroidism, neuropathy  HYPERTENSION  Disease Monitoring  Home BP Monitoring not checking Chest pain- no    Dyspnea- no Medications  Compliance-  Taking olmesartan.  Edema- no  HYPOTHYROIDISM Disease Monitoring Weight changes: no  Skin Changes: no Heat/Cold intolerance: cold intolerance  Medication Monitoring Compliance:  Taking synthroid -her dose has been decreased given low TSH.  She notes no dietary changes.  She is not taking biotin. Last TSH:   Lab Results  Component Value Date   TSH 0.81 07/30/2017   Neuropathy: She notes continuing to have tingling and some numbness in her bilateral feet and ankles and lower legs up to mid shin.  This has not changed.  No claudication.  No symptoms elsewhere.    Social History   Tobacco Use  Smoking Status Former Smoker  . Packs/day: 1.00  . Years: 40.00  . Pack years: 40.00  . Types: Cigarettes  . Last attempt to quit: 01/11/2016  . Years since quitting: 1.7  Smokeless Tobacco Never Used     ROS see history of present illness  Objective  Physical Exam Vitals:   10/01/17 1028  BP: 110/60  Pulse: 70  Temp: 97.6 F (36.4 C)  SpO2: 96%    BP Readings from Last 3 Encounters:  10/01/17 110/60  06/14/17 128/68  05/25/17 (!) 162/68   Wt Readings from Last 3 Encounters:  10/01/17 112 lb 6.4 oz (51 kg)  06/14/17 108 lb 1.9 oz (49 kg)  05/25/17 110 lb 12.8 oz (50.3 kg)    Physical Exam  Constitutional: No distress.  Cardiovascular: Normal rate, regular rhythm and normal heart sounds.  Pulmonary/Chest: Effort normal and breath sounds normal.  Musculoskeletal: She exhibits no edema.  Neurological: She is alert.  CN 2-12 intact, 5/5 strength in bilateral biceps, triceps, grip, quads, hamstrings, plantar and dorsiflexion, sensation to light touch slightly decreased in bilateral feet up to  mid shins, otherwise intact in bilateral UE and LE, normal gait  Skin: Skin is warm and dry. She is not diaphoretic.     Assessment/Plan: Please see individual problem list.  Essential hypertension Well-controlled.  Continue current regimen.  Check lab work.  Hypothyroidism Check TSH.  Continue Synthroid.  Neuropathy Refer to neurology.   Orders Placed This Encounter  Procedures  . Comp Met (CMET)  . TSH  . Ambulatory referral to Neurology    Referral Priority:   Routine    Referral Type:   Consultation    Referral Reason:   Specialty Services Required    Requested Specialty:   Neurology    Number of Visits Requested:   1    No orders of the defined types were placed in this encounter.    Tommi Rumps, MD [place]

## 2017-10-01 NOTE — Patient Instructions (Signed)
Nice to see you. We will get lab work today and contact you with the results. We will refer you to neurology for your neuropathy.

## 2017-10-01 NOTE — Assessment & Plan Note (Signed)
Check TSH.  Continue Synthroid. 

## 2017-10-01 NOTE — Assessment & Plan Note (Signed)
Refer to neurology

## 2017-10-01 NOTE — Assessment & Plan Note (Signed)
Well-controlled.  Continue current regimen.  Check lab work. 

## 2017-11-01 ENCOUNTER — Other Ambulatory Visit: Payer: Self-pay | Admitting: Family Medicine

## 2018-02-02 ENCOUNTER — Other Ambulatory Visit: Payer: Self-pay | Admitting: Family Medicine

## 2018-02-07 ENCOUNTER — Telehealth: Payer: Self-pay | Admitting: Family Medicine

## 2018-02-07 NOTE — Telephone Encounter (Signed)
Called patient and advised that she call pharmacy. We refilled for 90 days on 02/04/18

## 2018-02-07 NOTE — Telephone Encounter (Unsigned)
Copied from [place] [id number]. Topic: Quick Communication - Rx Refill/Question >> Feb 07, 2018 11:35 AM Judyann Munson wrote: Medication: olmesartan (BENICAR) 20 MG tablet  Has the patient contacted their pharmacy? No   Preferred Pharmacy (with phone number or street name): [facility], [place] [phone] (Phone) [phone] (Fax)    Agent: Please be advised that RX refills may take up to 3 business days. We ask that you follow-up with your pharmacy.

## 2018-03-20 ENCOUNTER — Ambulatory Visit (INDEPENDENT_AMBULATORY_CARE_PROVIDER_SITE_OTHER): Payer: Medicare HMO

## 2018-03-20 DIAGNOSIS — Z23 Encounter for immunization: Secondary | ICD-10-CM | POA: Diagnosis not present

## 2018-04-02 ENCOUNTER — Ambulatory Visit: Payer: Medicare HMO | Admitting: Family Medicine

## 2018-05-01 ENCOUNTER — Ambulatory Visit (INDEPENDENT_AMBULATORY_CARE_PROVIDER_SITE_OTHER): Payer: Medicare HMO | Admitting: Family Medicine

## 2018-05-01 ENCOUNTER — Encounter: Payer: Self-pay | Admitting: Family Medicine

## 2018-05-01 VITALS — BP 120/60 | HR 70 | Temp 97.6°F | Ht 59.0 in | Wt 110.2 lb

## 2018-05-01 DIAGNOSIS — E039 Hypothyroidism, unspecified: Secondary | ICD-10-CM | POA: Diagnosis not present

## 2018-05-01 DIAGNOSIS — R7303 Prediabetes: Secondary | ICD-10-CM | POA: Diagnosis not present

## 2018-05-01 DIAGNOSIS — Z780 Asymptomatic menopausal state: Secondary | ICD-10-CM

## 2018-05-01 DIAGNOSIS — J449 Chronic obstructive pulmonary disease, unspecified: Secondary | ICD-10-CM | POA: Diagnosis not present

## 2018-05-01 DIAGNOSIS — I10 Essential (primary) hypertension: Secondary | ICD-10-CM

## 2018-05-01 LAB — COMPREHENSIVE METABOLIC PANEL
ALT: 7 U/L (ref 0–35)
AST: 16 U/L (ref 0–37)
Albumin: 4.4 g/dL (ref 3.5–5.2)
Alkaline Phosphatase: 73 U/L (ref 39–117)
BILIRUBIN TOTAL: 0.4 mg/dL (ref 0.2–1.2)
BUN: 10 mg/dL (ref 6–23)
CO2: 34 meq/L — AB (ref 19–32)
Calcium: 10 mg/dL (ref 8.4–10.5)
Chloride: 99 mEq/L (ref 96–112)
Creatinine, Ser: 0.58 mg/dL (ref 0.40–1.20)
GFR: 99.29 mL/min (ref >60.00)
Glucose, Bld: 78 mg/dL (ref 70–99)
Potassium: 4.7 mEq/L (ref 3.5–5.1)
Sodium: 138 mEq/L (ref 135–145)
Total Protein: 6.9 g/dL (ref 6.0–8.3)

## 2018-05-01 LAB — HEMOGLOBIN A1C: Hgb A1c MFr Bld: 5.7 % (ref 4.6–6.5)

## 2018-05-01 LAB — TSH: TSH: 1.32 u[IU]/mL (ref 0.35–4.50)

## 2018-05-01 MED ORDER — TETANUS-DIPHTH-ACELL PERTUSSIS 5-2.5-18.5 LF-MCG/0.5 IM SUSP: 0.5000 mL | Freq: Once | INTRAMUSCULAR | 0 refills | Status: AC

## 2018-05-01 NOTE — Assessment & Plan Note (Signed)
Stable.  She will continue to monitor.

## 2018-05-01 NOTE — Assessment & Plan Note (Signed)
Check A1c. 

## 2018-05-01 NOTE — Progress Notes (Signed)
  Tommi Rumps, MD Phone: [phone]  Jane Davis is a 85 y.o. female who presents today for f/u.  CC: hypothyroidism, htn, copd  Hypothyroidism: Taking Synthroid.  No skin changes or heat or cold intolerance.  Hypertension: Not checking at home.  Taking olmesartan.  No chest pain, shortness of breath, or edema.  COPD: She is not had any issues with this recently.  She notes no albuterol use.  No significant cough.  No wheezing.  Occasionally gets a mucus in her throat and has to cough that up though no increase in this.  She does note some postnasal drip and nasal mucus in the morning though none throughout the day.  Social History   Tobacco Use  Smoking Status Former Smoker  . Packs/day: 1.00  . Years: 40.00  . Pack years: 40.00  . Types: Cigarettes  . Last attempt to quit: 01/11/2016  . Years since quitting: 2.3  Smokeless Tobacco Never Used     ROS see history of present illness  Objective  Physical Exam Vitals:   05/01/18 1311 05/01/18 1405  BP: 120/60   Pulse: 70   Temp: 97.6 F (36.4 C)   SpO2: 91% 96%    BP Readings from Last 3 Encounters:  05/01/18 120/60  10/01/17 110/60  06/14/17 128/68   Wt Readings from Last 3 Encounters:  05/01/18 110 lb 3.2 oz (50 kg)  10/01/17 112 lb 6.4 oz (51 kg)  06/14/17 108 lb 1.9 oz (49 kg)    Physical Exam Constitutional:      General: She is not in acute distress.    Appearance: She is not diaphoretic.  Cardiovascular:     Rate and Rhythm: Normal rate and regular rhythm.     Heart sounds: Normal heart sounds.  Pulmonary:     Effort: Pulmonary effort is normal.     Breath sounds: Normal breath sounds.  Musculoskeletal:     Right lower leg: No edema.     Left lower leg: No edema.  Skin:    General: Skin is warm and dry.  Neurological:     Mental Status: She is alert.      Assessment/Plan: Please see individual problem list.  Essential hypertension Well-controlled.  Check lab work.   Continue current medication.  COPD (chronic obstructive pulmonary disease) ([facility]) Stable.  She will continue to monitor.  Hypothyroidism Check TSH.  Continue Synthroid.  Prediabetes Check A1c.   Health Maintenance: Discussed need for tetanus vaccination.  DEXA scan ordered.  Patient deferred mammogram.  Orders Placed This Encounter  Procedures  . DG Bone Density    Standing Status:   Future    Standing Expiration Date:   06/30/2019    Order Specific Question:   Reason for Exam (SYMPTOM  OR DIAGNOSIS REQUIRED)    Answer:   postmenopausal estrogen deficiency    Order Specific Question:   Preferred imaging location?    Answer:   [facility]  . Comp Met (CMET)  . TSH  . HgB A1c    Meds ordered this encounter  Medications  . Tdap (BOOSTRIX) 5-2.5-18.5 LF-MCG/0.5 injection    Sig: Inject 0.5 mLs into the muscle once for 1 dose.    Dispense:  0.5 mL    Refill:  0     Tommi Rumps, MD [place]

## 2018-05-01 NOTE — Assessment & Plan Note (Signed)
Check TSH.  Continue Synthroid. 

## 2018-05-01 NOTE — Patient Instructions (Signed)
Nice to see you. We will get lab work today and contact you with the results. Please get the tetanus vaccination at your pharmacy.

## 2018-05-01 NOTE — Assessment & Plan Note (Signed)
Well-controlled.  Check lab work.  Continue current medication.

## 2018-05-05 ENCOUNTER — Other Ambulatory Visit: Payer: Self-pay | Admitting: Family Medicine

## 2018-05-13 ENCOUNTER — Encounter: Payer: Self-pay | Admitting: Family Medicine

## 2018-05-17 ENCOUNTER — Ambulatory Visit: Payer: Self-pay

## 2018-05-17 ENCOUNTER — Inpatient Hospital Stay
Admission: EM | Admit: 2018-05-17 | Discharge: 2018-05-19 | DRG: 192 | Disposition: A | Discharge status: Home / Self Care | Payer: Medicare Other | Attending: Internal Medicine | Admitting: Internal Medicine

## 2018-05-17 ENCOUNTER — Other Ambulatory Visit: Payer: Self-pay

## 2018-05-17 ENCOUNTER — Emergency Department: Payer: Medicare Other

## 2018-05-17 DIAGNOSIS — Z7982 Long term (current) use of aspirin: Secondary | ICD-10-CM | POA: Diagnosis not present

## 2018-05-17 DIAGNOSIS — R059 Cough, unspecified: Secondary | ICD-10-CM

## 2018-05-17 DIAGNOSIS — Z66 Do not resuscitate: Secondary | ICD-10-CM | POA: Diagnosis not present

## 2018-05-17 DIAGNOSIS — E039 Hypothyroidism, unspecified: Secondary | ICD-10-CM | POA: Diagnosis not present

## 2018-05-17 DIAGNOSIS — Z79899 Other long term (current) drug therapy: Secondary | ICD-10-CM | POA: Diagnosis not present

## 2018-05-17 DIAGNOSIS — J441 Chronic obstructive pulmonary disease with (acute) exacerbation: Secondary | ICD-10-CM | POA: Diagnosis not present

## 2018-05-17 DIAGNOSIS — J9601 Acute respiratory failure with hypoxia: Secondary | ICD-10-CM

## 2018-05-17 DIAGNOSIS — R0602 Shortness of breath: Secondary | ICD-10-CM

## 2018-05-17 DIAGNOSIS — N3941 Urge incontinence: Secondary | ICD-10-CM | POA: Diagnosis present

## 2018-05-17 DIAGNOSIS — I10 Essential (primary) hypertension: Secondary | ICD-10-CM | POA: Diagnosis present

## 2018-05-17 DIAGNOSIS — H409 Unspecified glaucoma: Secondary | ICD-10-CM | POA: Diagnosis present

## 2018-05-17 DIAGNOSIS — R0603 Acute respiratory distress: Secondary | ICD-10-CM | POA: Diagnosis not present

## 2018-05-17 DIAGNOSIS — R0902 Hypoxemia: Secondary | ICD-10-CM | POA: Diagnosis not present

## 2018-05-17 DIAGNOSIS — Z87891 Personal history of nicotine dependence: Secondary | ICD-10-CM

## 2018-05-17 DIAGNOSIS — Z9981 Dependence on supplemental oxygen: Secondary | ICD-10-CM | POA: Diagnosis not present

## 2018-05-17 DIAGNOSIS — R05 Cough: Secondary | ICD-10-CM

## 2018-05-17 DIAGNOSIS — Z7951 Long term (current) use of inhaled steroids: Secondary | ICD-10-CM

## 2018-05-17 DIAGNOSIS — R7303 Prediabetes: Secondary | ICD-10-CM | POA: Diagnosis present

## 2018-05-17 LAB — COMPREHENSIVE METABOLIC PANEL
ALK PHOS: 50 U/L (ref 38–126)
ALT: 13 U/L (ref 0–44)
ANION GAP: 10 (ref 5–15)
AST: 36 U/L (ref 15–41)
Albumin: 3.8 g/dL (ref 3.5–5.0)
BUN: 25 mg/dL — ABNORMAL HIGH (ref 8–23)
CALCIUM: 8.6 mg/dL — AB (ref 8.9–10.3)
CO2: 29 mmol/L (ref 22–32)
Chloride: 96 mmol/L — ABNORMAL LOW (ref 98–111)
Creatinine, Ser: 0.59 mg/dL (ref 0.44–1.00)
GFR calc Af Amer: >60 mL/min (ref >60)
GFR calc non Af Amer: >60 mL/min (ref >60)
Glucose, Bld: 112 mg/dL — ABNORMAL HIGH (ref 70–99)
Potassium: 3.8 mmol/L (ref 3.5–5.1)
Sodium: 135 mmol/L (ref 135–145)
Total Bilirubin: 1.2 mg/dL (ref 0.3–1.2)
Total Protein: 7.2 g/dL (ref 6.5–8.1)

## 2018-05-17 LAB — INFLUENZA PANEL BY PCR (TYPE A & B)
Influenza A By PCR: NEGATIVE
Influenza B By PCR: NEGATIVE

## 2018-05-17 LAB — CBC
HCT: 44 % (ref 36.0–46.0)
Hemoglobin: 14.5 g/dL (ref 12.0–15.0)
MCH: 31.5 pg (ref 26.0–34.0)
MCHC: 33 g/dL (ref 30.0–36.0)
MCV: 95.4 fL (ref 80.0–100.0)
Platelets: 171 10*3/uL (ref 150–400)
RBC: 4.61 MIL/uL (ref 3.87–5.11)
RDW: 11.9 % (ref 11.5–15.5)
WBC: 9.3 10*3/uL (ref 4.0–10.5)
nRBC: 0 % (ref 0.0–0.2)

## 2018-05-17 LAB — TROPONIN I: Troponin I: 0.05 ng/mL (ref <0.03)

## 2018-05-17 LAB — LACTIC ACID, PLASMA
Lactic Acid, Venous: 1.2 mmol/L (ref 0.5–1.9)
Lactic Acid, Venous: 2.8 mmol/L (ref 0.5–1.9)

## 2018-05-17 MED ORDER — LATANOPROST 0.005 % OP SOLN
1.0000 [drp] | Freq: Every day | OPHTHALMIC | Status: DC
  Administered 2018-05-17 – 2018-05-18 (×2): 1 [drp] via OPHTHALMIC
  Filled 2018-05-17: qty 2.5

## 2018-05-17 MED ORDER — IPRATROPIUM-ALBUTEROL 0.5-2.5 (3) MG/3ML IN SOLN
3.0000 mL | RESPIRATORY_TRACT | Status: DC
  Administered 2018-05-17 – 2018-05-18 (×5): 3 mL via RESPIRATORY_TRACT
  Filled 2018-05-17 (×5): qty 3

## 2018-05-17 MED ORDER — TIMOLOL MALEATE 0.5 % OP SOLN
1.0000 [drp] | Freq: Every day | OPHTHALMIC | Status: DC
  Administered 2018-05-17 – 2018-05-19 (×3): 1 [drp] via OPHTHALMIC
  Filled 2018-05-17: qty 5

## 2018-05-17 MED ORDER — ASPIRIN EC 81 MG PO TBEC
81.0000 mg | DELAYED_RELEASE_TABLET | Freq: Every day | ORAL | Status: DC
  Administered 2018-05-18 – 2018-05-19 (×2): 81 mg via ORAL
  Filled 2018-05-17 (×3): qty 1

## 2018-05-17 MED ORDER — METHYLPREDNISOLONE SODIUM SUCC 125 MG IJ SOLR
60.0000 mg | Freq: Four times a day (QID) | INTRAMUSCULAR | Status: DC
  Administered 2018-05-17 – 2018-05-19 (×7): 60 mg via INTRAVENOUS
  Filled 2018-05-17 (×7): qty 2

## 2018-05-17 MED ORDER — BUDESONIDE 0.25 MG/2ML IN SUSP
0.2500 mg | Freq: Two times a day (BID) | RESPIRATORY_TRACT | Status: DC
  Administered 2018-05-17 – 2018-05-19 (×4): 0.25 mg via RESPIRATORY_TRACT
  Filled 2018-05-17 (×4): qty 2

## 2018-05-17 MED ORDER — SODIUM CHLORIDE 0.9 % IV SOLN
INTRAVENOUS | Status: DC
  Administered 2018-05-17 – 2018-05-18 (×3): via INTRAVENOUS

## 2018-05-17 MED ORDER — LEVOTHYROXINE SODIUM 50 MCG PO TABS
75.0000 ug | ORAL_TABLET | Freq: Every day | ORAL | Status: DC
  Administered 2018-05-18 – 2018-05-19 (×2): 75 ug via ORAL
  Filled 2018-05-17 (×2): qty 1

## 2018-05-17 MED ORDER — HEPARIN SODIUM (PORCINE) 5000 UNIT/ML IJ SOLN
5000.0000 [IU] | Freq: Three times a day (TID) | INTRAMUSCULAR | Status: DC
  Administered 2018-05-17 – 2018-05-19 (×5): 5000 [IU] via SUBCUTANEOUS
  Filled 2018-05-17 (×5): qty 1

## 2018-05-17 MED ORDER — TIOTROPIUM BROMIDE MONOHYDRATE 18 MCG IN CAPS
18.0000 ug | ORAL_CAPSULE | Freq: Every day | RESPIRATORY_TRACT | Status: DC
  Filled 2018-05-17: qty 5

## 2018-05-17 MED ORDER — IPRATROPIUM-ALBUTEROL 0.5-2.5 (3) MG/3ML IN SOLN
3.0000 mL | Freq: Once | RESPIRATORY_TRACT | Status: AC
  Administered 2018-05-17: 3 mL via RESPIRATORY_TRACT

## 2018-05-17 MED ORDER — IPRATROPIUM-ALBUTEROL 0.5-2.5 (3) MG/3ML IN SOLN
3.0000 mL | Freq: Once | RESPIRATORY_TRACT | Status: AC
  Administered 2018-05-17: 3 mL via RESPIRATORY_TRACT
  Filled 2018-05-17: qty 3

## 2018-05-17 MED ORDER — DOCUSATE SODIUM 100 MG PO CAPS: 100.0000 mg | ORAL_CAPSULE | Freq: Two times a day (BID) | ORAL | Status: DC | PRN

## 2018-05-17 MED ORDER — IRBESARTAN 75 MG PO TABS
37.5000 mg | ORAL_TABLET | Freq: Every day | ORAL | Status: DC
  Administered 2018-05-18 – 2018-05-19 (×2): 37.5 mg via ORAL
  Filled 2018-05-17 (×3): qty 0.5

## 2018-05-17 MED ORDER — IPRATROPIUM-ALBUTEROL 0.5-2.5 (3) MG/3ML IN SOLN
RESPIRATORY_TRACT | Status: AC
  Filled 2018-05-17: qty 3

## 2018-05-17 MED ORDER — METHYLPREDNISOLONE SODIUM SUCC 125 MG IJ SOLR
125.0000 mg | Freq: Once | INTRAMUSCULAR | Status: AC
  Administered 2018-05-17: 125 mg via INTRAVENOUS
  Filled 2018-05-17: qty 2

## 2018-05-17 NOTE — Progress Notes (Signed)
CRITICAL VALUE ALERT  Critical Value:  Lactic acid 2.8  Date & Time Notied:  05/17/2018 at [place]  Provider Notified: Judd Gaudier  Orders Received/Actions taken: MD to place order for normal saline at 75 mL/hr

## 2018-05-17 NOTE — ED Notes (Signed)
Report called  

## 2018-05-17 NOTE — Telephone Encounter (Signed)
Yes to ER I think is appropriate

## 2018-05-17 NOTE — ED Provider Notes (Signed)
[facility] Emergency Department Provider Note   ____________________________________________    I have reviewed the triage vital signs and the nursing notes.   HISTORY  Chief Complaint Shortness of Breath and Influenza     HPI Jane Davis is a 85 y.o. female who presents with complaints of shortness of breath.  Patient with a history of COPD reportedly was found to appear weak and shaky by her son this morning, he placed a pulse oximeter on her finger and found her oxygen saturation to be 73%.  On nonrebreather per EMS oxygen saturation improved significantly.  Currently the patient states that overall she feels well.  No chest pain.  Some fatigue, mild shortness of breath.  Recent exposure to family member with upper respiratory infection  Past Medical History:  Diagnosis Date  . COPD (chronic obstructive pulmonary disease) ([place])   . Essential hypertension 03/22/2016  . Glaucoma   . Hypertension   . Hypothyroidism   . Hypoxia 02/11/2016   Overview:  Discharged from hospital on home oxygen.  . Palpitations 03/22/2016  . Prediabetes 02/19/2015  . Rectal lesion 05/29/2016  . Urge incontinence of urine 05/29/2016    Patient Active Problem List   Diagnosis Date Noted  . Acute respiratory failure with hypoxia ([place]) 05/17/2018  . Neuropathy 08/07/2016  . Urge incontinence of urine 05/29/2016  . Hemorrhoid 05/29/2016  . Hypothyroidism 03/22/2016  . Essential hypertension 03/22/2016  . Palpitations 03/22/2016  . COPD exacerbation ([place]) 02/07/2016  . Osteoporosis screening 10/06/2015  . Smoking 02/22/2015  . Prediabetes 02/19/2015  . Glaucoma 02/18/2015    Past Surgical History:  Procedure Laterality Date  . ABDOMINAL HYSTERECTOMY    . COLONOSCOPY  2013   [place]     Prior to Admission medications   Medication Sig Start Date End Date Taking? Authorizing Provider  albuterol (ACCUNEB) 1.25 MG/3ML nebulizer solution Take 3 mLs  (1.25 mg total) by nebulization every 6 (six) hours as needed. 05/28/17  Yes Leone Haven, MD  aspirin EC 81 MG tablet Take 81 mg by mouth daily.   Yes [provider]  latanoprost (XALATAN) 0.005 % ophthalmic solution Place 1 drop into both eyes at bedtime.    Yes [provider]  levothyroxine (SYNTHROID, LEVOTHROID) 75 MCG tablet Take 1 tablet (75 mcg total) by mouth daily. 07/03/17  Yes Leone Haven, MD  olmesartan (BENICAR) 20 MG tablet TAKE ONE TABLET BY MOUTH DAILY Patient taking differently: Take 20 mg by mouth daily.  05/06/18  Yes Leone Haven, MD  Timolol Maleate 0.5 % (DAILY) SOLN Place 1 drop into the right eye daily.   Yes [provider]     Allergies Patient has no known allergies.  Family History  Problem Relation Age of Onset  . Intracerebral hemorrhage Mother   . Cancer Sister   . Cancer Brother   . Bladder Cancer Neg Hx   . Prostate cancer Neg Hx     Social History Social History   Tobacco Use  . Smoking status: Former Smoker    Packs/day: 1.00    Years: 40.00    Pack years: 40.00    Types: Cigarettes    Last attempt to quit: 01/11/2016    Years since quitting: 2.3  . Smokeless tobacco: Never Used  Substance Use Topics  . Alcohol use: No  . Drug use: No    Review of Systems  Constitutional: No fever/chills Eyes: No visual changes.  ENT: No sore throat.  Cardiovascular: Denies chest pain. Respiratory: As above Gastrointestinal: No abdominal pain.   Genitourinary: Negative for dysuria. Musculoskeletal: Negative for back pain. Skin: Negative for rash. Neurological: Negative for headaches or weakness   ____________________________________________   PHYSICAL EXAM:  VITAL SIGNS: ED Triage Vitals  Enc Vitals Group     BP 05/17/18 0933 139/69     Pulse Rate 05/17/18 0933 98     Resp 05/17/18 0933 18     Temp 05/17/18 0933 99.1 F (37.3 C)     Temp Source 05/17/18 0933 Oral     SpO2 05/17/18 0933 100  %     Weight 05/17/18 0934 49.9 kg (110 lb)     Height 05/17/18 0934 1.524 m (5')     Head Circumference --      Peak Flow --      Pain Score 05/17/18 0934 0     Pain Loc --      Pain Edu? --      Excl. in [place]? --     Constitutional: Alert and oriented.  Nose: No congestion/rhinnorhea. Mouth/Throat: Mucous membranes are moist.   Neck:  Painless ROM Cardiovascular: Normal rate, regular rhythm. Grossly normal heart sounds.  Good peripheral circulation. Respiratory: Mildly increased respiratory effort with tachypnea, no retractions, poor airflow Gastrointestinal: Soft and nontender. No distention.  Genitourinary: deferred Musculoskeletal: No lower extremity tenderness nor edema.  Warm and well perfused Neurologic:  Normal speech and language. No gross focal neurologic deficits are appreciated.  Skin:  Skin is warm, dry and intact. No rash noted. Psychiatric: Mood and affect are normal. Speech and behavior are normal.  ____________________________________________   LABS (all labs ordered are listed, but only abnormal results are displayed)  Labs Reviewed  TROPONIN I - Abnormal; Notable for the following components:      Result Value   Troponin I 0.05 (*)    All other components within normal limits  COMPREHENSIVE METABOLIC PANEL - Abnormal; Notable for the following components:   Chloride 96 (*)    Glucose, Bld 112 (*)    BUN 25 (*)    Calcium 8.6 (*)    All other components within normal limits  CBC  LACTIC ACID, PLASMA  INFLUENZA PANEL BY PCR (TYPE A & B)  LACTIC ACID, PLASMA   ____________________________________________  EKG  ED ECG REPORT I, Lavonia Drafts, the attending physician, personally viewed and interpreted this ECG.  Date: 05/17/2018  Rhythm: normal sinus rhythm QRS Axis: normal Intervals: normal ST/T Wave abnormalities: Nonspecific ST changes Narrative Interpretation: Nonspecific  changes  ____________________________________________  RADIOLOGY  Chest x-ray unremarkable ____________________________________________   PROCEDURES  Procedure(s) performed: No  Procedures   Critical Care performed: yes  CRITICAL CARE Performed by: Lavonia Drafts   Total critical care time: 30 minutes  Critical care time was exclusive of separately billable procedures and treating other patients.  Critical care was necessary to treat or prevent imminent or life-threatening deterioration.  Critical care was time spent personally by me on the following activities: development of treatment plan with patient and/or surrogate as well as nursing, discussions with consultants, evaluation of patient's response to treatment, examination of patient, obtaining history from patient or surrogate, ordering and performing treatments and interventions, ordering and review of laboratory studies, ordering and review of radiographic studies, pulse oximetry and re-evaluation of patient's condition.  ____________________________________________   INITIAL IMPRESSION / ASSESSMENT AND PLAN / ED COURSE  Pertinent labs & imaging results that were available during my care of the patient were  reviewed by me and considered in my medical decision making (see chart for details).  Patient presents with new oxygen requirement, difficulty breathing concerning for COPD exacerbation versus pneumonia.  Poor airflow treated with duo nebs, IV Solu-Medrol while awaiting labs.  Lab work significant for elevated troponin, suspect cardiac strain, no chest pain to suspect ACS.  Chest x-ray negative for pneumonia  Influenza negative  Patient only minimally improved after treatment, continues to require oxygen will admit to the hospital service    ____________________________________________   FINAL CLINICAL IMPRESSION(S) / ED DIAGNOSES  Final diagnoses:  COPD exacerbation ([place])  Shortness of breath   Hypoxia        Note:  This document was prepared using Dragon voice recognition software and may include unintentional dictation errors.   Lavonia Drafts, MD 05/17/18 [phone]

## 2018-05-17 NOTE — ED Triage Notes (Signed)
Pt arrived via ems for report of decreased O2 sat of 73% improved to 100% on 10L non-rebreather - pt expose to son who lives with her and had the flu last week - she is c/o cough, weakness, and fever

## 2018-05-17 NOTE — Progress Notes (Signed)
Family Meeting Note  Advance Directive:yes  Today a meeting took place with the Patient.  The following clinical team members were present during this meeting:MD  The following were discussed:Patient's diagnosis: COPD, acute respiratory failure, hypothyroidism, Patient's progosis: Unable to determine and Goals for treatment: DNR  Additional follow-up to be provided: PMD  Time spent during discussion:20 minutes  Vaughan Basta, MD

## 2018-05-17 NOTE — Telephone Encounter (Signed)
Pt's son called to repost pt has been sick for 2 days. Pt with wheezing, a "deep" congested cough, head congestion, trembling hands, dry mouth, low grade fever, oxygen saturation of 72%, loss of appetite. Pt is fatigued and was not alert yesterday. Yesterday, pt had a fever of 101.5 today it was 99.5. Pt has a productive cough with clear sputum. Pt's son can hear the congestion in her chest. Son stated she was not short of breath. Pt is pale.  Son advised to call 911 now. Son verbalized understanding.  Reason for Disposition . Patient sounds very sick or weak to the triager  Answer Assessment - Initial Assessment Questions 1. ONSET: "When did the cough begin?"      2 days ago 2. SEVERITY: "How bad is the cough today?"      Congested, coughing "deep" hears congestion in chest  3. RESPIRATORY DISTRESS: "Describe your breathing."      wheezing 4. FEVER: "Do you have a fever?" If so, ask: "What is your temperature, how was it measured, and when did it start?"     Had fever yesterday today 99.5 5. SPUTUM: "Describe the color of your sputum" (clear, white, yellow, green)     clear 6. HEMOPTYSIS: "Are you coughing up any blood?" If so ask: "How much?" (flecks, streaks, tablespoons, etc.)     no 7. CARDIAC HISTORY: "Do you have any history of heart disease?" (e.g., heart attack, congestive heart failure)      HTN 8. LUNG HISTORY: "Do you have any history of lung disease?"  (e.g., pulmonary embolus, asthma, emphysema)     COPD uses inhaler but cannot find it. Pneumonia twice in 4 years  50. PE RISK FACTORS: "Do you have a history of blood clots?" (or: recent major surgery, recent prolonged travel, bedridden)     no 10. OTHER SYMPTOMS: "Do you have any other symptoms?" (e.g., runny nose, wheezing, chest pain)       Wheezing, hands are trembling, mouth is dry, loss of appetite, was not alert yesterday. O2 saturation 72%-pt does not wear oxygen, pallor 11. PREGNANCY: "Is there any chance you are  pregnant?" "When was your last menstrual period?"       n/a 12. TRAVEL: "Have you traveled out of the country in the last month?" (e.g., travel history, exposures)       No international travel  Protocols used: [place]

## 2018-05-17 NOTE — Progress Notes (Signed)
Notified Dr. Roxine Caddy patient's flu a & b negative. Order received to discontinue droplet precautions.

## 2018-05-17 NOTE — ED Notes (Signed)
Call to floor to give report

## 2018-05-17 NOTE — ED Notes (Signed)
Date and time results received: 05/17/18 11:12 AM   Test: Troponin Critical Value: 0.05  Name of Provider Notified: Corky Downs MD (epic chat notification)

## 2018-05-17 NOTE — H&P (Signed)
[place] at [place] NAME: Jane Davis    MR#:  [id number]  DATE OF BIRTH:  06-27-1933  DATE OF ADMISSION:  05/17/2018  PRIMARY CARE PHYSICIAN: Leone Haven, MD   REQUESTING/REFERRING PHYSICIAN: Corky Downs  CHIEF COMPLAINT:   Chief Complaint  Patient presents with  . Shortness of Breath  . Influenza    HISTORY OF PRESENT ILLNESS: Jane Davis  is a 85 y.o. female with a known history of COPD, essential hypertension, hypothyroidism, palpitation, allergic incontinence-does not require oxygen or inhalers on a regular basis but have nebulizer machine for rescue. She had some cold-like symptoms 5 days ago since then she is feeling short of breath and cough without any sputum production.  This is worsening so decided to come to emergency room today. She was noted to have extensive wheezing but chest x-ray was clear and influenza test was negative.  She was hypoxic and requiring supplemental oxygen so ER physician gave to admit to hospitalist service for COPD exacerbation.  PAST MEDICAL HISTORY:   Past Medical History:  Diagnosis Date  . COPD (chronic obstructive pulmonary disease) ([place])   . Essential hypertension 03/22/2016  . Glaucoma   . Hypertension   . Hypothyroidism   . Hypoxia 02/11/2016   Overview:  Discharged from hospital on home oxygen.  . Palpitations 03/22/2016  . Prediabetes 02/19/2015  . Rectal lesion 05/29/2016  . Urge incontinence of urine 05/29/2016    PAST SURGICAL HISTORY:  Past Surgical History:  Procedure Laterality Date  . ABDOMINAL HYSTERECTOMY    . COLONOSCOPY  2013   [place]     SOCIAL HISTORY:  Social History   Tobacco Use  . Smoking status: Former Smoker    Packs/day: 1.00    Years: 40.00    Pack years: 40.00    Types: Cigarettes    Last attempt to quit: 01/11/2016    Years since quitting: 2.3  . Smokeless tobacco: Never Used  Substance Use Topics  . Alcohol use: No    FAMILY HISTORY:   Family History  Problem Relation Age of Onset  . Intracerebral hemorrhage Mother   . Cancer Sister   . Cancer Brother   . Bladder Cancer Neg Hx   . Prostate cancer Neg Hx     DRUG ALLERGIES: No Known Allergies  REVIEW OF SYSTEMS:   CONSTITUTIONAL: No fever, fatigue or weakness.  EYES: No blurred or double vision.  EARS, NOSE, AND THROAT: No tinnitus or ear pain.  RESPIRATORY: Have cough, shortness of breath, wheezing, no hemoptysis.  CARDIOVASCULAR: No chest pain, orthopnea, edema.  GASTROINTESTINAL: No nausea, vomiting, diarrhea or abdominal pain.  GENITOURINARY: No dysuria, hematuria.  ENDOCRINE: No polyuria, nocturia,  HEMATOLOGY: No anemia, easy bruising or bleeding SKIN: No rash or lesion. MUSCULOSKELETAL: No joint pain or arthritis.   NEUROLOGIC: No tingling, numbness, weakness.  PSYCHIATRY: No anxiety or depression.   MEDICATIONS AT HOME:  Prior to Admission medications   Medication Sig Start Date End Date Taking? Authorizing Provider  albuterol (ACCUNEB) 1.25 MG/3ML nebulizer solution Take 3 mLs (1.25 mg total) by nebulization every 6 (six) hours as needed. 05/28/17   Leone Haven, MD  aspirin EC 81 MG tablet Take 81 mg by mouth daily.    [provider]  latanoprost (XALATAN) 0.005 % ophthalmic solution Place 1 drop into both eyes at bedtime.     [provider]  levothyroxine (SYNTHROID, LEVOTHROID) 75 MCG tablet Take 1 tablet (75 mcg total) by  mouth daily. 07/03/17   Leone Haven, MD  olmesartan (BENICAR) 20 MG tablet TAKE ONE TABLET BY MOUTH DAILY 05/06/18   Leone Haven, MD  Timolol Maleate 0.5 % (DAILY) SOLN Place 1 drop into the right eye daily.    [provider]      PHYSICAL EXAMINATION:   VITAL SIGNS: Blood pressure (!) 106/47, pulse 86, temperature 99.1 F (37.3 C), temperature source Oral, resp. rate (!) 21, height 5' (1.524 m), weight 49.9 kg, SpO2 97 %.  GENERAL:  85 y.o.-year-old patient lying in the bed  with no acute distress.  EYES: Pupils equal, round, reactive to light and accommodation. No scleral icterus. Extraocular muscles intact.  HEENT: Head atraumatic, normocephalic. Oropharynx and nasopharynx clear.  NECK:  Supple, no jugular venous distention. No thyroid enlargement, no tenderness.  LUNGS: Normal breath sounds bilaterally, bilateral wheezing, no crepitation. No use of accessory muscles of respiration.  CARDIOVASCULAR: S1, S2 normal. No murmurs, rubs, or gallops.  ABDOMEN: Soft, nontender, nondistended. Bowel sounds present. No organomegaly or mass.  EXTREMITIES: No pedal edema, cyanosis, or clubbing.  NEUROLOGIC: Cranial nerves II through XII are intact. Muscle strength 5/5 in all extremities. Sensation intact. Gait not checked.  PSYCHIATRIC: The patient is alert and oriented x 3.  SKIN: No obvious rash, lesion, or ulcer.   LABORATORY PANEL:   CBC Recent Labs  Lab 05/17/18 0936  WBC 9.3  HGB 14.5  HCT 44.0  PLT 171  MCV 95.4  MCH 31.5  MCHC 33.0  RDW 11.9   ------------------------------------------------------------------------------------------------------------------  Chemistries  Recent Labs  Lab 05/17/18 0952  NA 135  K 3.8  CL 96*  CO2 29  GLUCOSE 112*  BUN 25*  CREATININE 0.59  CALCIUM 8.6*  AST 36  ALT 13  ALKPHOS 50  BILITOT 1.2   ------------------------------------------------------------------------------------------------------------------ estimated creatinine clearance is 38.3 mL/min (by C-G formula based on SCr of 0.59 mg/dL). ------------------------------------------------------------------------------------------------------------------ No results for input(s): TSH, T4TOTAL, T3FREE, THYROIDAB in the last 72 hours.  Invalid input(s): FREET3   Coagulation profile No results for input(s): INR, PROTIME in the last 168  hours. ------------------------------------------------------------------------------------------------------------------- No results for input(s): DDIMER in the last 72 hours. -------------------------------------------------------------------------------------------------------------------  Cardiac Enzymes Recent Labs  Lab 05/17/18 0936  TROPONINI 0.05*   ------------------------------------------------------------------------------------------------------------------ Invalid input(s): POCBNP  ---------------------------------------------------------------------------------------------------------------  Urinalysis    Component Value Date/Time   APPEARANCEUR Clear 06/01/2016 0843   GLUCOSEU Negative 06/01/2016 0843   BILIRUBINUR Negative 06/01/2016 0843   PROTEINUR Negative 06/01/2016 0843   NITRITE Negative 06/01/2016 0843   LEUKOCYTESUR Negative 06/01/2016 0843     RADIOLOGY: Dg Chest 1 View  Result Date: 05/17/2018 CLINICAL DATA:  Shortness of breath and cough EXAM: CHEST  1 VIEW COMPARISON:  February 07, 2016 FINDINGS: The heart size and mediastinal contours are within normal limits. Both lungs are clear. Calcified granuloma is identified in the left lung base. The visualized skeletal structures are unremarkable. IMPRESSION: No active cardiopulmonary disease. Electronically Signed   By: Abelardo Diesel M.D.   On: 05/17/2018 09:54    EKG: Orders placed or performed during the hospital encounter of 05/17/18  . ED EKG  . ED EKG    IMPRESSION AND PLAN:  * Ac respi failure with hypoxia   COPD exacerbation   IV and inhaled steroids No need for antibiotics for now DuoNeb and Spiriva.  *Hypothyroidism Continue levothyroxine.  *Glaucoma Continue eyedrops.     All the records are reviewed and case discussed with ED provider. Management plans discussed with  the patient, family and they are in agreement.  CODE STATUS: DNR Code Status History    Date Active Date  Inactive Code Status Order ID Comments User Context   02/07/2016 1809 02/08/2016 1921 Full Code [id number]  Loletha Grayer, MD ED       TOTAL TIME TAKING CARE OF THIS PATIENT: 50 minutes.    Vaughan Basta M.D on 05/17/2018   Between 7am to 6pm - Pager - [phone]  After 6pm go to [email or web address] - password [email or web address] [email or web address]  Office  [phone]  CC: Primary care physician; Leone Haven, MD   Note: This dictation was prepared with Dragon dictation along with smaller phrase technology. Any transcriptional errors that result from this process are unintentional.

## 2018-05-17 NOTE — ED Notes (Addendum)
Pt did not ring for nurse when neb finished and noted upon arrival to rrom that pt desat to 86% - required 4L to bring O2 sat to 94%-96% - O2 decreaed to 2L and O2 remained at 96%

## 2018-05-18 DIAGNOSIS — J441 Chronic obstructive pulmonary disease with (acute) exacerbation: Secondary | ICD-10-CM | POA: Diagnosis not present

## 2018-05-18 DIAGNOSIS — R0602 Shortness of breath: Secondary | ICD-10-CM | POA: Diagnosis not present

## 2018-05-18 LAB — BASIC METABOLIC PANEL
Anion gap: 7 (ref 5–15)
BUN: 27 mg/dL — AB (ref 8–23)
CO2: 29 mmol/L (ref 22–32)
CREATININE: 0.57 mg/dL (ref 0.44–1.00)
Calcium: 8.3 mg/dL — ABNORMAL LOW (ref 8.9–10.3)
Chloride: 100 mmol/L (ref 98–111)
GFR calc Af Amer: >60 mL/min (ref >60)
GFR calc non Af Amer: >60 mL/min (ref >60)
Glucose, Bld: 172 mg/dL — ABNORMAL HIGH (ref 70–99)
Potassium: 3.9 mmol/L (ref 3.5–5.1)
Sodium: 136 mmol/L (ref 135–145)

## 2018-05-18 LAB — CBC
HCT: 39.8 % (ref 36.0–46.0)
Hemoglobin: 12.9 g/dL (ref 12.0–15.0)
MCH: 30.9 pg (ref 26.0–34.0)
MCHC: 32.4 g/dL (ref 30.0–36.0)
MCV: 95.4 fL (ref 80.0–100.0)
Platelets: 146 10*3/uL — ABNORMAL LOW (ref 150–400)
RBC: 4.17 MIL/uL (ref 3.87–5.11)
RDW: 11.6 % (ref 11.5–15.5)
WBC: 5.4 10*3/uL (ref 4.0–10.5)
nRBC: 0 % (ref 0.0–0.2)

## 2018-05-18 LAB — LACTIC ACID, PLASMA: Lactic Acid, Venous: 0.9 mmol/L (ref 0.5–1.9)

## 2018-05-18 MED ORDER — IPRATROPIUM-ALBUTEROL 0.5-2.5 (3) MG/3ML IN SOLN
3.0000 mL | Freq: Four times a day (QID) | RESPIRATORY_TRACT | Status: DC
  Administered 2018-05-18 – 2018-05-19 (×3): 3 mL via RESPIRATORY_TRACT
  Filled 2018-05-18 (×3): qty 3

## 2018-05-18 MED ORDER — AZITHROMYCIN 250 MG PO TABS
250.0000 mg | ORAL_TABLET | Freq: Every day | ORAL | Status: DC
  Administered 2018-05-19: 250 mg via ORAL
  Filled 2018-05-18: qty 1

## 2018-05-18 MED ORDER — AZITHROMYCIN 500 MG PO TABS
500.0000 mg | ORAL_TABLET | Freq: Every day | ORAL | Status: AC
  Administered 2018-05-18: 500 mg via ORAL

## 2018-05-18 NOTE — Progress Notes (Signed)
[place] at [place] NAME: Jane Davis    MR#:  [id number]  DATE OF BIRTH:  1933-08-14  SUBJECTIVE:  CHIEF COMPLAINT:   Chief Complaint  Patient presents with  . Shortness of Breath  . Influenza  Patient seen and evaluated today Has cough Congestion Desaturates on ambulation  REVIEW OF SYSTEMS:    ROS  CONSTITUTIONAL: No documented fever. Has fatigue, weakness. No weight gain, no weight loss.  EYES: No blurry or double vision.  ENT: No tinnitus. No postnasal drip. No redness of the oropharynx.  RESPIRATORY: Has cough, no wheeze, no hemoptysis. Has dyspnea.  CARDIOVASCULAR: No chest pain. No orthopnea. No palpitations. No syncope.  GASTROINTESTINAL: No nausea, no vomiting or diarrhea. No abdominal pain. No melena or hematochezia.  GENITOURINARY: No dysuria or hematuria.  ENDOCRINE: No polyuria or nocturia. No heat or cold intolerance.  HEMATOLOGY: No anemia. No bruising. No bleeding.  INTEGUMENTARY: No rashes. No lesions.  MUSCULOSKELETAL: No arthritis. No swelling. No gout.  NEUROLOGIC: No numbness, tingling, or ataxia. No seizure-type activity.  PSYCHIATRIC: No anxiety. No insomnia. No ADD.   DRUG ALLERGIES:  No Known Allergies  VITALS:  Blood pressure 110/63, pulse 70, temperature (!) 97.5 F (36.4 C), temperature source Oral, resp. rate 18, height 5' (1.524 m), weight 49.9 kg, SpO2 94 %.  PHYSICAL EXAMINATION:   Physical Exam  GENERAL:  85 y.o.-year-old patient lying in the bed with no acute distress.  EYES: Pupils equal, round, reactive to light and accommodation. No scleral icterus. Extraocular muscles intact.  HEENT: Head atraumatic, normocephalic. Oropharynx and nasopharynx clear.  NECK:  Supple, no jugular venous distention. No thyroid enlargement, no tenderness.  LUNGS: Decreased breath sounds bilaterally, has wheezing in both lung fields. No use of accessory muscles of respiration.  CARDIOVASCULAR: S1, S2  normal. No murmurs, rubs, or gallops.  ABDOMEN: Soft, nontender, nondistended. Bowel sounds present. No organomegaly or mass.  EXTREMITIES: No cyanosis, clubbing or edema b/l.    NEUROLOGIC: Cranial nerves II through XII are intact. No focal Motor or sensory deficits b/l.   PSYCHIATRIC: The patient is alert and oriented x 3.  SKIN: No obvious rash, lesion, or ulcer.   LABORATORY PANEL:   CBC Recent Labs  Lab 05/18/18 0528  WBC 5.4  HGB 12.9  HCT 39.8  PLT 146*   ------------------------------------------------------------------------------------------------------------------ Chemistries  Recent Labs  Lab 05/17/18 0952 05/18/18 0528  NA 135 136  K 3.8 3.9  CL 96* 100  CO2 29 29  GLUCOSE 112* 172*  BUN 25* 27*  CREATININE 0.59 0.57  CALCIUM 8.6* 8.3*  AST 36  --   ALT 13  --   ALKPHOS 50  --   BILITOT 1.2  --    ------------------------------------------------------------------------------------------------------------------  Cardiac Enzymes Recent Labs  Lab 05/17/18 0936  TROPONINI 0.05*   ------------------------------------------------------------------------------------------------------------------  RADIOLOGY:  Dg Chest 1 View  Result Date: 05/17/2018 CLINICAL DATA:  Shortness of breath and cough EXAM: CHEST  1 VIEW COMPARISON:  February 07, 2016 FINDINGS: The heart size and mediastinal contours are within normal limits. Both lungs are clear. Calcified granuloma is identified in the left lung base. The visualized skeletal structures are unremarkable. IMPRESSION: No active cardiopulmonary disease. Electronically Signed   By: Abelardo Diesel M.D.   On: 05/17/2018 09:54     ASSESSMENT AND PLAN:   85 year old elderly female patient with history of COPD, hypertension, hypothyroidism, glaucoma currently under hospitalist service for shortness of breath and wheezing  -Acute COPD  exacerbation improving slowly Continue aggressive nebulization therapy and IV  Solu-Medrol Start oral azithromycin antibiotic  -Hypothyroidism Continue levothyroxine  -Glaucoma Continue eyedrops  -DVT prophylaxis subcu heparin  All the records are reviewed and case discussed with Care Management/Social Worker. Management plans discussed with the patient, family and they are in agreement.  CODE STATUS: Full code  DVT Prophylaxis: SCDs  TOTAL TIME TAKING CARE OF THIS PATIENT: 38 minutes.   POSSIBLE D/C IN 2 to 3 DAYS, DEPENDING ON CLINICAL CONDITION.  Saundra Shelling M.D on 05/18/2018 at 12:50 PM  Between 7am to 6pm - Pager - [phone]  After 6pm go to [email or web address] - password [email or web address] [place] [facility]  Office  [phone]  CC: Primary care physician; Leone Haven, MD  Note: This dictation was prepared with Dragon dictation along with smaller phrase technology. Any transcriptional errors that result from this process are unintentional.

## 2018-05-18 NOTE — Progress Notes (Signed)
SATURATION QUALIFICATIONS: (This note is used to comply with regulatory documentation for home oxygen)  Patient Saturations on Room Air at Rest = 85%  Patient Saturations on Room Air while Ambulating = 82%  Patient Saturations on 2 Liters of oxygen while Ambulating = 90%  Please briefly explain why patient needs home oxygen:low oxygen while walking

## 2018-05-19 MED ORDER — PREDNISONE 10 MG PO TABS: 10.0000 mg | ORAL_TABLET | Freq: Every day | ORAL | 0 refills | Status: DC

## 2018-05-19 MED ORDER — AZITHROMYCIN 250 MG PO TABS: ORAL_TABLET | ORAL | 0 refills | Status: DC

## 2018-05-19 NOTE — Discharge Summary (Signed)
[facility] NAME: Jane Davis    MR#:  [id number]  DATE OF BIRTH:  06/21/1933  DATE OF ADMISSION:  05/17/2018 ADMITTING PHYSICIAN: Vaughan Basta, MD  DATE OF DISCHARGE: 05/19/2018  PRIMARY CARE PHYSICIAN: Leone Haven, MD   ADMISSION DIAGNOSIS:  Shortness of breath [R06.02] Cough [R05] Hypoxia [R09.02] COPD exacerbation (HCC) [J44.1]  DISCHARGE DIAGNOSIS:  Acute COPD exacerbation Hypoxia secondary to COPD exacerbation Acute respiratory distress with hypoxia secondary COPD exacerbation Hypothyroidism Glaucoma SECONDARY DIAGNOSIS:   Past Medical History:  Diagnosis Date  . COPD (chronic obstructive pulmonary disease) (Jane Davis)   . Essential hypertension 03/22/2016  . Glaucoma   . Hypertension   . Hypothyroidism   . Hypoxia 02/11/2016   Overview:  Discharged from hospital on home oxygen.  . Palpitations 03/22/2016  . Prediabetes 02/19/2015  . Rectal lesion 05/29/2016  . Urge incontinence of urine 05/29/2016     ADMITTING HISTORY  Jane Davis  is a 85 y.o. female with a known history of COPD, essential hypertension, hypothyroidism, palpitation, allergic incontinence-does not require oxygen or inhalers on a regular basis but have nebulizer machine for rescue.She had some cold-like symptoms 5 days ago since then she is feeling short of breath and cough without any sputum production.  This is worsening so decided to come to emergency room today.She was noted to have extensive wheezing but chest x-ray was clear and influenza test was negative.  She was hypoxic and requiring supplemental oxygen so ER physician gave to admit to hospitalist service for COPD exacerbation.  HOSPITAL COURSE:  Patient seen and evaluated today.  Patient was initially put on oxygen via nasal cannula, received IV Solu-Medrol and aggressive nebulization therapy during hospitalization.  Zithromax antibiotic was also added.  Shortness of breath and  wheezing improved.  She was weaned off oxygen to room air.  Dyspnea resolved.  CONSULTS OBTAINED:    DRUG ALLERGIES:  No Known Allergies  DISCHARGE MEDICATIONS:   Allergies as of 05/19/2018   No Known Allergies     Medication List    TAKE these medications   albuterol 1.25 MG/3ML nebulizer solution Commonly known as:  ACCUNEB Take 3 mLs (1.25 mg total) by nebulization every 6 (six) hours as needed.   aspirin EC 81 MG tablet Take 81 mg by mouth daily.   azithromycin 250 MG tablet Commonly known as:  ZITHROMAX One tablet once daily   latanoprost 0.005 % ophthalmic solution Commonly known as:  XALATAN Place 1 drop into both eyes at bedtime.   levothyroxine 75 MCG tablet Commonly known as:  SYNTHROID, LEVOTHROID Take 1 tablet (75 mcg total) by mouth daily.   olmesartan 20 MG tablet Commonly known as:  BENICAR TAKE ONE TABLET BY MOUTH DAILY   predniSONE 10 MG tablet Commonly known as:  DELTASONE Take 1 tablet (10 mg total) by mouth daily. Label  & dispense according to the schedule below.  6 tablets day one, then 5 table day 2, then 4 tablets day 3, then 3 tablets day 4, 2 tablets day 5, then 1 tablet day 6, then stop   Timolol Maleate 0.5 % (DAILY) Soln Place 1 drop into the right eye daily.       Today   Patient seen today No shortness of breath Comfortable on room air Weaned off oxygen Hemodynamically stable VITAL SIGNS:  Blood pressure (!) 145/57, pulse 82, temperature (!) 97.5 F (36.4 C), temperature source Oral, resp. rate 18, height 5' (1.524 m),  weight 49.9 kg, SpO2 93 %.  I/O:  No intake or output data in the 24 hours ending 05/19/18 1128  PHYSICAL EXAMINATION:  Physical Exam  GENERAL:  85 y.o.-year-old patient lying in the bed with no acute distress.  LUNGS: Normal breath sounds bilaterally, no wheezing, rales,rhonchi or crepitation. No use of accessory muscles of respiration.  CARDIOVASCULAR: S1, S2 normal. No murmurs, rubs, or gallops.   ABDOMEN: Soft, non-tender, non-distended. Bowel sounds present. No organomegaly or mass.  NEUROLOGIC: Moves all 4 extremities. PSYCHIATRIC: The patient is alert and oriented x 3.  SKIN: No obvious rash, lesion, or ulcer.   DATA REVIEW:   CBC Recent Labs  Lab 05/18/18 0528  WBC 5.4  HGB 12.9  HCT 39.8  PLT 146*    Chemistries  Recent Labs  Lab 05/17/18 0952 05/18/18 0528  NA 135 136  K 3.8 3.9  CL 96* 100  CO2 29 29  GLUCOSE 112* 172*  BUN 25* 27*  CREATININE 0.59 0.57  CALCIUM 8.6* 8.3*  AST 36  --   ALT 13  --   ALKPHOS 50  --   BILITOT 1.2  --     Cardiac Enzymes Recent Labs  Lab 05/17/18 0936  TROPONINI 0.05*    Microbiology Results  Results for orders placed or performed in visit on 06/01/16  Microscopic Examination     Status: None   Collection Time: 06/01/16  8:43 AM  Result Value Ref Range Status   WBC, UA 0-5 0 - 5 /hpf Final   RBC, UA 0-2 0 - 2 /hpf Final   Epithelial Cells (non renal) 0-10 0 - 10 /hpf Final   Bacteria, UA None seen None seen/Few Final    RADIOLOGY:  No results found.  Follow up with PCP in 1 week.  Management plans discussed with the patient, family and they are in agreement.  CODE STATUS: Full code    Code Status Orders  (From admission, onward)         Start     Ordered   05/17/18 1615  Full code  Continuous     05/17/18 1614        Code Status History    Date Active Date Inactive Code Status Order ID Comments User Context   02/07/2016 1809 02/08/2016 1921 Full Code [id number]  Loletha [facility], MD ED      TOTAL TIME TAKING CARE OF THIS PATIENT ON DAY OF DISCHARGE: more than 35 minutes.   Saundra Shelling M.D on 05/19/2018 at 11:28 AM  Between 7am to 6pm - Pager - [phone]  After 6pm go to [email or web address] - password [email or web address] [facility]  Office  [phone]  CC: Primary care physician; Leone Haven, MD  Note: This dictation was prepared with Dragon dictation along with  smaller phrase technology. Any transcriptional errors that result from this process are unintentional.

## 2018-05-19 NOTE — Progress Notes (Signed)
SATURATION QUALIFICATIONS:   Patient Saturations on Room Air at Rest = 95%  Patient Saturations on Room Air while Ambulating = 94%    Please briefly explain why patient needs home oxygen: patient amublated on room air around nursing station. No acute distress noted.

## 2018-05-19 NOTE — Progress Notes (Signed)
Discharge instructions reviewed with patient and family.  

## 2018-05-20 ENCOUNTER — Telehealth: Payer: Self-pay | Admitting: Family Medicine

## 2018-05-20 NOTE — Telephone Encounter (Signed)
Transition Care Management Follow-up Telephone Call  How have you been since you were released from the hospital? Patient says he still feels weak, and she does not like taking all this medication. Prednisone and Zithromax.   Do you understand why you were in the hospital? yes   Do you understand the discharge instrcutions? yes  Items Reviewed:  Medications reviewed: yes  Allergies reviewed: yes  Dietary changes reviewed: yes  Referrals reviewed: yes   Functional Questionnaire:   Activities of Daily Living (ADLs):   She states they are independent in the following: ambulation, bathing and hygiene, feeding, continence, grooming, toileting and dressing States they require assistance with the following: No assistance needed at this time.   Any transportation issues/concerns?: no   Any patient concerns? no   Confirmed importance and date/time of follow-up visits scheduled: yes   Confirmed with patient if condition begins to worsen call PCP or go to the ER.  Patient was given the Call-a-Nurse line [phone]: yes

## 2018-05-28 ENCOUNTER — Other Ambulatory Visit: Payer: Self-pay | Admitting: Family Medicine

## 2018-05-31 ENCOUNTER — Ambulatory Visit (INDEPENDENT_AMBULATORY_CARE_PROVIDER_SITE_OTHER): Payer: 59 | Admitting: Family Medicine

## 2018-05-31 ENCOUNTER — Encounter: Payer: Self-pay | Admitting: Family Medicine

## 2018-05-31 DIAGNOSIS — J441 Chronic obstructive pulmonary disease with (acute) exacerbation: Secondary | ICD-10-CM

## 2018-05-31 LAB — BASIC METABOLIC PANEL
BUN: 13 mg/dL (ref 6–23)
CO2: 31 meq/L (ref 19–32)
Calcium: 9.4 mg/dL (ref 8.4–10.5)
Chloride: 100 mEq/L (ref 96–112)
Creatinine, Ser: 0.64 mg/dL (ref 0.40–1.20)
GFR: 88.61 mL/min (ref >60.00)
Glucose, Bld: 76 mg/dL (ref 70–99)
Potassium: 4.7 mEq/L (ref 3.5–5.1)
Sodium: 138 mEq/L (ref 135–145)

## 2018-05-31 NOTE — Progress Notes (Signed)
Tommi Rumps, MD Phone: [phone]  Jane Davis is a 85 y.o. female who presents today for hospital follow-up.  CC: COPD exacerbation  Patient was hospitalized from 05/17/18-05/19/18 for a COPD exacerbation. Patient developed cold like symptoms and progressively worsened. Evaluation in the ED noted extensive wheezing with a clear chest x-ray.  Influenza test was negative.  She was hypoxic and required supplemental oxygen so she was admitted to the hospital for COPD exacerbation.  She received IV Solu-Medrol and aggressive nebulizer therapy.  She was treated with Zithromax.  Her shortness of breath and wheezing improved.  She is weaned off of oxygen to room air.  She completed a prednisone taper at home and completed her azithromycin.  She notes she is slowly getting better.  Still some phlegm in her throat though no fevers or shortness of breath.  Cough is nonproductive.  She continues as needed use of albuterol nebulizer.  She reports she got a letter from her insurance advising that her hospital stay would not be covered.  On review of the letter it appears that this may be related to her being categorized as an inpatient admission as opposed to an observation admission.  She was advised to contact the number through her insurance to appeal and contact billing through the hospital.  Discharge summary reviewed.  Medications reviewed.  Social History   Tobacco Use  Smoking Status Former Smoker  . Packs/day: 1.00  . Years: 40.00  . Pack years: 40.00  . Types: Cigarettes  . Last attempt to quit: 01/11/2016  . Years since quitting: 2.3  Smokeless Tobacco Never Used     ROS see history of present illness  Objective  Physical Exam Vitals:   05/31/18 1133 05/31/18 1155  BP: 140/60   Pulse: 85   Temp: (!) 97.5 F (36.4 C)   SpO2: 93% 96%    BP Readings from Last 3 Encounters:  05/31/18 140/60  05/19/18 (!) 145/57  05/01/18 120/60   Wt Readings from Last 3 Encounters:   05/31/18 109 lb 6.4 oz (49.6 kg)  05/17/18 110 lb (49.9 kg)  05/01/18 110 lb 3.2 oz (50 kg)    Physical Exam Constitutional:      General: She is not in acute distress.    Appearance: She is not diaphoretic.  HENT:     Head: Normocephalic and atraumatic.     Mouth/Throat:     Mouth: Mucous membranes are moist.     Pharynx: Oropharynx is clear.  Eyes:     Conjunctiva/sclera: Conjunctivae normal.     Pupils: Pupils are equal, round, and reactive to light.  Cardiovascular:     Rate and Rhythm: Normal rate and regular rhythm.     Heart sounds: Normal heart sounds.  Pulmonary:     Effort: Pulmonary effort is normal.     Breath sounds: Normal breath sounds.  Musculoskeletal:     Right lower leg: No edema.     Left lower leg: No edema.  Skin:    General: Skin is warm and dry.  Neurological:     Mental Status: She is alert.      Assessment/Plan: Please see individual problem list.  COPD exacerbation ([place]) Patient recently hospitalized for COPD exacerbation.  She has progressively been improving.  I did discuss adding an additional controller inhaler though we opted to defer this at this time given that she previously required no daily medications.  If she does not continue to improve we could reconsider adding an additional  inhaler.  She will monitor her symptoms.  Continue as needed albuterol.    Orders Placed This Encounter  Procedures  . Basic Metabolic Panel (BMET)    No orders of the defined types were placed in this encounter.    Tommi Rumps, MD [facility]

## 2018-05-31 NOTE — Patient Instructions (Signed)
Nice to see you. Please continue to monitor your symptoms and if they do not continue to improve please let us know. Please contact your insurance company and the billing department at the hospital to discuss the letter you received.

## 2018-05-31 NOTE — Assessment & Plan Note (Signed)
Patient recently hospitalized for COPD exacerbation.  She has progressively been improving.  I did discuss adding an additional controller inhaler though we opted to defer this at this time given that she previously required no daily medications.  If she does not continue to improve we could reconsider adding an additional inhaler.  She will monitor her symptoms.  Continue as needed albuterol.

## 2018-06-18 ENCOUNTER — Ambulatory Visit (INDEPENDENT_AMBULATORY_CARE_PROVIDER_SITE_OTHER): Payer: Medicare HMO

## 2018-06-18 VITALS — BP 122/64 | HR 78 | Resp 16 | Ht 58.25 in | Wt 107.1 lb

## 2018-06-18 DIAGNOSIS — Z0000 Encounter for general adult medical examination without abnormal findings: Secondary | ICD-10-CM | POA: Diagnosis not present

## 2018-06-18 NOTE — Patient Instructions (Addendum)
  Ms. Tamura , Thank you for taking time to come for your Medicare Wellness Visit. I appreciate your ongoing commitment to your health goals. Please review the following plan we discussed and let me know if I can assist you in the future.   Consider bone density test.  Keep all routine maintenance appointments.   These are the goals we discussed: Goals    . Increase physical activity     Walk for exercise 5 days weekly, 30 minutes        This is a list of the screening recommended for you and due dates:  Health Maintenance  Topic Date Due  . Tetanus Vaccine  05/07/1954  . DEXA scan (bone density measurement)  05/07/2000  . Flu Shot  Completed  . Pneumonia vaccines  Completed

## 2018-06-18 NOTE — Progress Notes (Signed)
Subjective:   Darlis Yulieth Carrender is a 85 y.o. female who presents for Medicare Annual (Subsequent) preventive examination.  Review of Systems:  No ROS.  Medicare Wellness Visit. Additional risk factors are reflected in the social history. Cardiac Risk Factors include: advanced age (>61men, >27 women);hypertension     Objective:     Vitals: BP 122/64 (BP Location: Left Arm, Patient Position: Sitting, Cuff Size: Normal)   Pulse 78   Resp 16   Ht 4' 10.25" (1.48 m)   Wt 107 lb 1.9 oz (48.6 kg)   SpO2 95%   BMI 22.20 kg/m   Body mass index is 22.2 kg/m.  Advanced Directives 06/18/2018 05/17/2018 06/14/2017 02/07/2016  Does Patient Have a Medical Advance Directive? No No Yes No  Type of Advance Directive - Public librarian -  Does patient want to make changes to medical advance directive? - - No - Patient declined -  Copy of [place] in Chart? - - No - copy requested -  Would patient like information on creating a medical advance directive? No - Patient declined No - Patient declined - Yes - Scientist, clinical (histocompatibility and immunogenetics) given    Tobacco Social History   Tobacco Use  Smoking Status Former Smoker  . Packs/day: 1.00  . Years: 40.00  . Pack years: 40.00  . Types: Cigarettes  . Last attempt to quit: 01/11/2016  . Years since quitting: 2.4  Smokeless Tobacco Never Used     Counseling given: Not Answered   Clinical Intake:  Pre-visit preparation completed: Yes        Diabetes: No  How often do you need to have someone help you when you read instructions, pamphlets, or other written materials from your doctor or pharmacy?: 1 - Never  Interpreter Needed?: No     Past Medical History:  Diagnosis Date  . COPD (chronic obstructive pulmonary disease) ([place])   . Essential hypertension 03/22/2016  . Glaucoma   . Hypertension   . Hypothyroidism   . Hypoxia 02/11/2016   Overview:  Discharged from hospital on home oxygen.  . Palpitations  03/22/2016  . Prediabetes 02/19/2015  . Rectal lesion 05/29/2016  . Urge incontinence of urine 05/29/2016   Past Surgical History:  Procedure Laterality Date  . ABDOMINAL HYSTERECTOMY    . COLONOSCOPY  2013   [place]    Family History  Problem Relation Age of Onset  . Intracerebral hemorrhage Mother   . Cancer Sister   . Cancer Brother   . Bladder Cancer Neg Hx   . Prostate cancer Neg Hx    Social History   Socioeconomic History  . Marital status: Widowed    Spouse name: Not on file  . Number of children: Not on file  . Years of education: Not on file  . Highest education level: Not on file  Occupational History  . Not on file  Social Needs  . Financial resource strain: Not hard at all  . Food insecurity:    Worry: Never true    Inability: Never true  . Transportation needs:    Medical: No    Non-medical: No  Tobacco Use  . Smoking status: Former Smoker    Packs/day: 1.00    Years: 40.00    Pack years: 40.00    Types: Cigarettes    Last attempt to quit: 01/11/2016    Years since quitting: 2.4  . Smokeless tobacco: Never Used  Substance and Sexual Activity  . Alcohol use:  No  . Drug use: No  . Sexual activity: Never  Lifestyle  . Physical activity:    Days per week: Not on file    Minutes per session: Not on file  . Stress: Not at all  Relationships  . Social connections:    Talks on phone: Not on file    Gets together: Not on file    Attends religious service: Not on file    Active member of club or organization: Not on file    Attends meetings of clubs or organizations: Not on file    Relationship status: Not on file  Other Topics Concern  . Not on file  Social History Narrative  . Not on file    Outpatient Encounter Medications as of 06/18/2018  Medication Sig  . albuterol (ACCUNEB) 1.25 MG/3ML nebulizer solution USE 1 VIAL IN NEBULIZER EVERY 6 HOURS AS NEEDED  . aspirin EC 81 MG tablet Take 81 mg by mouth daily.  Marland Kitchen latanoprost (XALATAN) 0.005  % ophthalmic solution Place 1 drop into both eyes at bedtime.   Marland Kitchen levothyroxine (SYNTHROID, LEVOTHROID) 75 MCG tablet Take 1 tablet (75 mcg total) by mouth daily.  Marland Kitchen olmesartan (BENICAR) 20 MG tablet TAKE ONE TABLET BY MOUTH DAILY (Patient taking differently: Take 20 mg by mouth daily. )  . Timolol Maleate 0.5 % (DAILY) SOLN Place 1 drop into the right eye daily.  . [DISCONTINUED] azithromycin (ZITHROMAX) 250 MG tablet One tablet once daily  . [DISCONTINUED] predniSONE (DELTASONE) 10 MG tablet Take 1 tablet (10 mg total) by mouth daily. Label  & dispense according to the schedule below.  6 tablets day one, then 5 table day 2, then 4 tablets day 3, then 3 tablets day 4, 2 tablets day 5, then 1 tablet day 6, then stop   No facility-administered encounter medications on file as of 06/18/2018.     Activities of Daily Living In your present state of health, do you have any difficulty performing the following activities: 06/18/2018 05/17/2018  Hearing? N N  Vision? N N  Difficulty concentrating or making decisions? N N  Walking or climbing stairs? N N  Dressing or bathing? N N  Doing errands, shopping? N N  Preparing Food and eating ? N -  Using the Toilet? N -  In the past six months, have you accidently leaked urine? Y -  Comment Managed with daily pad -  Do you have problems with loss of bowel control? N -  Managing your Medications? N -  Managing your Finances? N -  Housekeeping or managing your Housekeeping? N -  Some recent data might be hidden    Patient Care Team: Leone Haven, MD as PCP - General (Family Medicine) Leone Haven, MD as Consulting Physician (Family Medicine) Christene Lye, MD (General Surgery)    Assessment:   This is a routine wellness examination for Maridel.  Discussed bone density; she has deferred scheduling at this time but will consider at a later date. Educational material provided.   Life alert services educational information provided.    Health Screenings  Bone Density - ordered 05/01/18 Glaucoma -yes Hearing -demonstrates normal hearing during conversation Hemoglobin A1C -05/01/18 (5.7) TSH - 05/01/18 (1.32) Dental- every 6 months Vision- every 6 months  Social  Alcohol intake -no Smoking history- former Smokers in home? none Illicit drug use? none Exercise -walking Diet -regular Sexually Active -never  Safety  Patient feels safe at home. Life alert information provided. Patient does  have smoke detectors at home. Patient does wear sunscreen or protective clothing when in direct sunlight. Patient does wear seat belt when driving or riding with others.   Activities of Daily Living Patient can do their own household chores. Denies needing assistance with: driving, feeding themselves, getting from bed to chair, getting to the toilet, bathing/showering, dressing, managing money, climbing flight of stairs, or preparing meals.   Depression Screen Patient denies losing interest in daily life, feeling hopeless, or crying easily over simple problems.   Fall Screen Patient denies being afraid of falling or falling in the last year.   Memory Screen Patient denies problems with memory, misplacing items, and is able to balance checkbook/bank accounts.  Patient is alert, normal appearance, oriented to person/place/and time. Correctly identified the president of the [place], recall of 1/3 objects, and performing simple calculations.  Patient displays appropriate judgement and can read correct time from watch face.   Immunizations The following Immunizations were discussed: Influenza, shingles, pneumonia, and tetanus.   Other Providers Patient Care Team: Leone Haven, MD as PCP - General (Family Medicine) Leone Haven, MD as Consulting Physician (Family Medicine) Christene Lye, MD (General Surgery)  Exercise Activities and Dietary recommendations Current Exercise Habits: The patient does not  participate in regular exercise at present  Goals    . Increase physical activity     Walk for exercise 5 days weekly, 30 minutes        Fall Risk Fall Risk  05/01/2018 05/25/2017 03/22/2016  Falls in the past year? 0 No No  Number falls in past yr: 0 - -  Injury with Fall? 0 - -   Depression Screen PHQ 2/9 Scores 05/01/2018 05/25/2017 03/22/2016  PHQ - 2 Score 0 0 0     Cognitive Function MMSE - Mini Mental State Exam 06/14/2017  Orientation to time 5  Orientation to Place 5  Registration 3  Attention/ Calculation 4  Attention/Calculation-comments Some difficulty with calculation  Recall 1  Recall-comments 1 out of 3 words remembered  Language- name 2 objects 2  Language- repeat 1  Language- follow 3 step command 3  Language- read & follow direction 1  Write a sentence 1  Copy design 1  Total score 27     6CIT Screen 06/18/2018  What Year? 0 points  What month? 0 points  What time? 0 points  Count back from 20 0 points  Months in reverse 0 points  Repeat phrase 0 points  Total Score 0    Immunization History  Administered Date(s) Administered  . Influenza, High Dose Seasonal PF 02/15/2017, 03/20/2018  . Influenza-Unspecified 02/06/2015, 02/01/2016  . Pneumococcal Conjugate-13 06/14/2017  . Pneumococcal Polysaccharide-23 12/07/2014   Screening Tests Health Maintenance  Topic Date Due  . TETANUS/TDAP  05/07/1954  . DEXA SCAN  05/07/2000  . INFLUENZA VACCINE  Completed  . PNA vac Low Risk Adult  Completed      Plan:    End of life planning; Advanced aging; Advanced directives discussed.  No HCPOA/Living Will.  Additional information declined at this time.  I have personally reviewed and noted the following in the patient's chart:   . Medical and social history . Use of alcohol, tobacco or illicit drugs  . Current medications and supplements . Functional ability and status . Nutritional status . Physical activity . Advanced directives . List of other  physicians . Hospitalizations, surgeries, and ER visits in previous 12 months . Vitals . Screenings to include cognitive, depression,  and falls . Referrals and appointments  In addition, I have reviewed and discussed with patient certain preventive protocols, quality metrics, and best practice recommendations. A written personalized care plan for preventive services as well as general preventive health recommendations were provided to patient.     Varney Biles, LPN  [id number]/2633

## 2018-06-24 ENCOUNTER — Other Ambulatory Visit: Payer: Self-pay | Admitting: Family Medicine

## 2018-06-24 NOTE — Progress Notes (Signed)
I have reviewed the above note and agree.  Gurshaan Matsuoka, M.D.  

## 2018-07-26 ENCOUNTER — Telehealth: Payer: Self-pay | Admitting: Family Medicine

## 2018-07-26 NOTE — Telephone Encounter (Signed)
I spoke with the patient's son regarding this patient.  Confirmed that he is on her DPR.  He wanted to know if the patient would benefit from an oxygen concentrator to have at home.  She is not currently on oxygen.  He notes she has had issues with developing COPD exacerbations that require hospitalization for low oxygenation levels.  She has not had any formal testing for COPD though her smoking history and recent exacerbation seem to indicate likely underlying COPD.  I discussed that she would likely not meet criteria for oxygen at home at this time as she did not meet criteria when she left the hospital.  I discussed that if her oxygenation drops low into the 80s when she has an exacerbation she needs to be evaluated as opposed to just going on oxygen at home.  Discussed that they could contact us though she was having significant issues she should be evaluated in the emergency department if this were to recur.

## 2018-08-02 ENCOUNTER — Other Ambulatory Visit: Payer: Self-pay | Admitting: Family Medicine

## 2018-08-12 ENCOUNTER — Other Ambulatory Visit: Payer: Self-pay | Admitting: Family Medicine

## 2018-09-12 DIAGNOSIS — H401131 Primary open-angle glaucoma, bilateral, mild stage: Secondary | ICD-10-CM | POA: Diagnosis not present

## 2018-09-19 ENCOUNTER — Other Ambulatory Visit: Payer: Self-pay | Admitting: Family Medicine

## 2018-09-19 DIAGNOSIS — H401131 Primary open-angle glaucoma, bilateral, mild stage: Secondary | ICD-10-CM | POA: Diagnosis not present

## 2018-10-30 ENCOUNTER — Encounter: Payer: Self-pay | Admitting: Family Medicine

## 2018-10-30 ENCOUNTER — Other Ambulatory Visit: Payer: Self-pay

## 2018-10-30 ENCOUNTER — Ambulatory Visit (INDEPENDENT_AMBULATORY_CARE_PROVIDER_SITE_OTHER): Payer: Medicare HMO | Admitting: Family Medicine

## 2018-10-30 VITALS — Ht <= 58 in | Wt 111.0 lb

## 2018-10-30 DIAGNOSIS — I10 Essential (primary) hypertension: Secondary | ICD-10-CM | POA: Diagnosis not present

## 2018-10-30 DIAGNOSIS — R7303 Prediabetes: Secondary | ICD-10-CM | POA: Diagnosis not present

## 2018-10-30 DIAGNOSIS — E039 Hypothyroidism, unspecified: Secondary | ICD-10-CM

## 2018-10-30 DIAGNOSIS — G629 Polyneuropathy, unspecified: Secondary | ICD-10-CM

## 2018-10-30 NOTE — Assessment & Plan Note (Signed)
Adequately controlled for age.  Continue current regimen.  She will come in for lab work in about a month.

## 2018-10-30 NOTE — Assessment & Plan Note (Signed)
Continue Synthroid.  Check TSH. 

## 2018-10-30 NOTE — Progress Notes (Signed)
Virtual Visit via telephone Note  This visit type was conducted due to national recommendations for restrictions regarding the COVID-19 pandemic (e.g. social distancing).  This format is felt to be most appropriate for this patient at this time.  All issues noted in this document were discussed and addressed.  No physical exam was performed (except for noted visual exam findings with Video Visits).   I connected with Jane Davis today at 10:00 AM EDT by telephone and verified that I am speaking with the correct person using two identifiers. Location patient: home Location provider: work Persons participating in the virtual visit: patient, provider  I discussed the limitations, risks, security and privacy concerns of performing an evaluation and management service by telephone and the availability of in person appointments. I also discussed with the patient that there may be a patient responsible charge related to this service. The patient expressed understanding and agreed to proceed.  Interactive audio and video telecommunications were attempted between this provider and patient, however failed, due to patient having technical difficulties OR patient did not have access to video capability.  We continued and completed visit with audio only.  Reason for visit: follow-up  HPI: HYPERTENSION  Disease Monitoring  Home BP Monitoring 142/60 Chest pain- no    Dyspnea- no Medications  Compliance-  Taking olmesartan.  Edema- no  HYPOTHYROIDISM Disease Monitoring Weight changes: no  Skin Changes: no Heat/Cold intolerance: some cold intolerance though this is chronic  Medication Monitoring Compliance:  Taking synthroid   Last TSH:   Lab Results  Component Value Date   TSH 1.32 05/01/2018    Neuropathy: Patient continues to have some mild tingling and numbness in her feet bilaterally.  This has not changed at all over several years.  She was previously referred to neurology though she  declined that evaluation.  B12 is been in the normal range in the past.  She is prediabetic.   ROS: See pertinent positives and negatives per HPI.  Past Medical History:  Diagnosis Date  . COPD (chronic obstructive pulmonary disease) ([place])   . Essential hypertension 03/22/2016  . Glaucoma   . Hypertension   . Hypothyroidism   . Hypoxia 02/11/2016   Overview:  Discharged from hospital on home oxygen.  . Palpitations 03/22/2016  . Prediabetes 02/19/2015  . Rectal lesion 05/29/2016  . Urge incontinence of urine 05/29/2016    Past Surgical History:  Procedure Laterality Date  . ABDOMINAL HYSTERECTOMY    . COLONOSCOPY  2013   [place]     Family History  Problem Relation Age of Onset  . Intracerebral hemorrhage Mother   . Cancer Sister   . Cancer Brother   . Bladder Cancer Neg Hx   . Prostate cancer Neg Hx     SOCIAL HX: Former smoker.   Current Outpatient Medications:  .  albuterol (ACCUNEB) 1.25 MG/3ML nebulizer solution, USE 1 VIAL IN NEBULIZER EVERY 6 HOURS AS NEEDED, Disp: 75 vial, Rfl: 1 .  aspirin EC 81 MG tablet, Take 81 mg by mouth daily., Disp: , Rfl:  .  latanoprost (XALATAN) 0.005 % ophthalmic solution, Place 1 drop into both eyes at bedtime. , Disp: , Rfl:  .  olmesartan (BENICAR) 20 MG tablet, TAKE ONE TABLET BY MOUTH DAILY, Disp: 90 tablet, Rfl: 0 .  SYNTHROID 75 MCG tablet, TAKE ONE TABLET BY MOUTH DAILY, Disp: 30 tablet, Rfl: 1 .  Timolol Maleate 0.5 % (DAILY) SOLN, Place 1 drop into the right eye daily., Disp: ,  Rfl:   EXAM: This is a telehealth telephone visit and thus no physical exam was completed.  ASSESSMENT AND PLAN:  Discussed the following assessment and plan:  Essential hypertension Adequately controlled for age.  Continue current regimen.  She will come in for lab work in about a month.  Hypothyroidism Continue Synthroid.  Check TSH.  Neuropathy This continues to be an issue.  Discussed referral to neurology though given the COVID-19  pandemic she opted to defer this at this time.  She will continue to monitor.  Prediabetes Check A1c with lab work.   Social distancing precautions and sick precautions given regarding COVID-19.   I discussed the assessment and treatment plan with the patient. The patient was provided an opportunity to ask questions and all were answered. The patient agreed with the plan and demonstrated an understanding of the instructions.   The patient was advised to call back or seek an in-person evaluation if the symptoms worsen or if the condition fails to improve as anticipated.  I provided 14 minutes of non-face-to-face time during this encounter.   Tommi Rumps, MD

## 2018-10-30 NOTE — Assessment & Plan Note (Signed)
This continues to be an issue.  Discussed referral to neurology though given the COVID-19 pandemic she opted to defer this at this time.  She will continue to monitor.

## 2018-10-30 NOTE — Assessment & Plan Note (Signed)
Check A1c with lab work.

## 2018-10-31 ENCOUNTER — Telehealth: Payer: Self-pay | Admitting: Family Medicine

## 2018-10-31 NOTE — Telephone Encounter (Signed)
I called pt to schedule lab appt and follow up. Pt vm is not set up.

## 2018-11-26 ENCOUNTER — Other Ambulatory Visit: Payer: Self-pay | Admitting: Family Medicine

## 2018-12-23 ENCOUNTER — Other Ambulatory Visit: Payer: Self-pay | Admitting: Family Medicine

## 2018-12-23 NOTE — Telephone Encounter (Signed)
I filled it for 30 days.  Please attempt to call her again to get her scheduled for labs.

## 2019-01-20 ENCOUNTER — Other Ambulatory Visit: Payer: Self-pay | Admitting: Family Medicine

## 2019-02-17 ENCOUNTER — Other Ambulatory Visit: Payer: Self-pay | Admitting: Family Medicine

## 2019-03-16 ENCOUNTER — Other Ambulatory Visit: Payer: Self-pay | Admitting: Family Medicine

## 2019-03-17 ENCOUNTER — Other Ambulatory Visit (INDEPENDENT_AMBULATORY_CARE_PROVIDER_SITE_OTHER): Payer: Medicare HMO

## 2019-03-17 ENCOUNTER — Other Ambulatory Visit: Payer: Self-pay

## 2019-03-17 DIAGNOSIS — R7303 Prediabetes: Secondary | ICD-10-CM | POA: Diagnosis not present

## 2019-03-17 DIAGNOSIS — I10 Essential (primary) hypertension: Secondary | ICD-10-CM | POA: Diagnosis not present

## 2019-03-17 DIAGNOSIS — E039 Hypothyroidism, unspecified: Secondary | ICD-10-CM | POA: Diagnosis not present

## 2019-03-17 LAB — BASIC METABOLIC PANEL
BUN: 9 mg/dL (ref 6–23)
CO2: 30 mEq/L (ref 19–32)
Calcium: 9.5 mg/dL (ref 8.4–10.5)
Chloride: 98 mEq/L (ref 96–112)
Creatinine, Ser: 0.54 mg/dL (ref 0.40–1.20)
GFR: 107.59 mL/min (ref >60.00)
Glucose, Bld: 77 mg/dL (ref 70–99)
Potassium: 4.6 mEq/L (ref 3.5–5.1)
Sodium: 137 mEq/L (ref 135–145)

## 2019-03-17 LAB — HEMOGLOBIN A1C: Hgb A1c MFr Bld: 5.7 % (ref 4.6–6.5)

## 2019-03-17 LAB — TSH: TSH: 1.58 u[IU]/mL (ref 0.35–4.50)

## 2019-03-18 NOTE — Telephone Encounter (Signed)
Patient came in and had labs done.  Jane Davis,cma

## 2019-03-19 ENCOUNTER — Telehealth: Payer: Self-pay

## 2019-03-19 NOTE — Telephone Encounter (Signed)
I called and spoke with the patient and informed her that all her labs  came back acceptable, patient understood.  Geet Hosking,cma

## 2019-03-19 NOTE — Telephone Encounter (Signed)
-----   Message from Leone Haven, MD sent at 03/19/2019 12:59 PM EST ----- Please let the patient know that her lab works acceptable.  Thanks.

## 2019-03-20 DIAGNOSIS — H401131 Primary open-angle glaucoma, bilateral, mild stage: Secondary | ICD-10-CM | POA: Diagnosis not present

## 2019-03-31 ENCOUNTER — Encounter: Payer: Self-pay | Admitting: Family Medicine

## 2019-03-31 ENCOUNTER — Ambulatory Visit (INDEPENDENT_AMBULATORY_CARE_PROVIDER_SITE_OTHER): Payer: Medicare HMO | Admitting: Family Medicine

## 2019-03-31 ENCOUNTER — Other Ambulatory Visit: Payer: Self-pay

## 2019-03-31 DIAGNOSIS — E039 Hypothyroidism, unspecified: Secondary | ICD-10-CM

## 2019-03-31 DIAGNOSIS — I10 Essential (primary) hypertension: Secondary | ICD-10-CM | POA: Diagnosis not present

## 2019-03-31 DIAGNOSIS — R0982 Postnasal drip: Secondary | ICD-10-CM

## 2019-03-31 DIAGNOSIS — Z13820 Encounter for screening for osteoporosis: Secondary | ICD-10-CM | POA: Diagnosis not present

## 2019-03-31 MED ORDER — LEVOTHYROXINE SODIUM 75 MCG PO TABS: 75.0000 ug | ORAL_TABLET | Freq: Every day | ORAL | 1 refills | Status: AC

## 2019-03-31 MED ORDER — OLMESARTAN MEDOXOMIL 20 MG PO TABS: 20.0000 mg | ORAL_TABLET | Freq: Every day | ORAL | 1 refills | Status: AC

## 2019-03-31 MED ORDER — TETANUS-DIPHTH-ACELL PERTUSSIS 5-2.5-18.5 LF-MCG/0.5 IM SUSP: 0.5000 mL | Freq: Once | INTRAMUSCULAR | 0 refills | Status: AC

## 2019-03-31 NOTE — Assessment & Plan Note (Signed)
Previously well controlled.  She will continue her current regimen.

## 2019-03-31 NOTE — Progress Notes (Signed)
Virtual Visit via telephone Note  This visit type was conducted due to national recommendations for restrictions regarding the COVID-19 pandemic (e.g. social distancing).  This format is felt to be most appropriate for this patient at this time.  All issues noted in this document were discussed and addressed.  No physical exam was performed (except for noted visual exam findings with Video Visits).   I connected with Jane Davis today at 10:00 AM EST by telephone and verified that I am speaking with the correct person using two identifiers. Location patient: home Location provider: work  Persons participating in the virtual visit: patient, provider  I discussed the limitations, risks, security and privacy concerns of performing an evaluation and management service by telephone and the availability of in person appointments. I also discussed with the patient that there may be a patient responsible charge related to this service. The patient expressed understanding and agreed to proceed.  Interactive audio and video telecommunications were attempted between this provider and patient, however failed, due to patient having technical difficulties OR patient did not have access to video capability.  We continued and completed visit with audio only.   Reason for visit: follow-up  HPI: HYPERTENSION  Disease Monitoring  Home BP Monitoring not checking Chest pain- no    Dyspnea- no Medications  Compliance-  Taking olmesartan.  Edema- no  HYPOTHYROIDISM Disease Monitoring Skin Changes: no Heat/Cold intolerance: no  Medication Monitoring Compliance:  Taking synthroid   Last TSH:   Lab Results  Component Value Date   TSH 1.58 03/17/2019   Memory: occasionally loses what she is going to say.  This can happen when she is talking to somebody.  She has word finding issues at times.  Does not happen all the time.  Feels as though it may be getting slightly worse.  Occasionally she will forget  names.  She cooks and feeds herself.  She drives and does not get lost.  She is bathing and dressing herself.  No issues with finances.  She does manage her own finances.  No depression.  Most recent TSH was in the normal range.  Postnasal drip: Patient notes she has had a little bit of phlegm in her throat for the last 4 weeks.  Minimal cough from the irritation.  No shortness of breath or fevers.  No congestion.  She does note some clear rhinorrhea.  Some sneezing.  No COVID-19 exposure.  No Tylenol or ibuprofen use.     ROS: See pertinent positives and negatives per HPI.  Past Medical History:  Diagnosis Date  . COPD (chronic obstructive pulmonary disease) ([place])   . Essential hypertension 03/22/2016  . Glaucoma   . Hypertension   . Hypothyroidism   . Hypoxia 02/11/2016   Overview:  Discharged from hospital on home oxygen.  . Palpitations 03/22/2016  . Prediabetes 02/19/2015  . Rectal lesion 05/29/2016  . Urge incontinence of urine 05/29/2016    Past Surgical History:  Procedure Laterality Date  . ABDOMINAL HYSTERECTOMY    . COLONOSCOPY  2013   [place]     Family History  Problem Relation Age of Onset  . Intracerebral hemorrhage Mother   . Cancer Sister   . Cancer Brother   . Bladder Cancer Neg Hx   . Prostate cancer Neg Hx     SOCIAL HX: Former smoker   Current Outpatient Medications:  .  albuterol (ACCUNEB) 1.25 MG/3ML nebulizer solution, USE 1 VIAL IN NEBULIZER EVERY 6 HOURS AS NEEDED, Disp:  75 vial, Rfl: 1 .  aspirin EC 81 MG tablet, Take 81 mg by mouth daily., Disp: , Rfl:  .  latanoprost (XALATAN) 0.005 % ophthalmic solution, Place 1 drop into both eyes at bedtime. , Disp: , Rfl:  .  levothyroxine (SYNTHROID) 75 MCG tablet, Take 1 tablet (75 mcg total) by mouth daily., Disp: 90 tablet, Rfl: 1 .  olmesartan (BENICAR) 20 MG tablet, Take 1 tablet (20 mg total) by mouth daily., Disp: 90 tablet, Rfl: 1 .  Tdap (BOOSTRIX) 5-2.5-18.5 LF-MCG/0.5 injection, Inject 0.5  mLs into the muscle once for 1 dose., Disp: 0.5 mL, Rfl: 0 .  Timolol Maleate 0.5 % (DAILY) SOLN, Place 1 drop into the right eye daily., Disp: , Rfl:   EXAM: This is a telehealth telephone visit notes no physical exam was completed.   ASSESSMENT AND PLAN:  Discussed the following assessment and plan:  Essential hypertension Previously well controlled.  She will continue her current regimen.  Hypothyroidism Adequately controlled.  Continue Synthroid.  Postnasal drip This has been going on for about 4 weeks.  I suspect there is an allergic component.  Unlikely to be a viral illness given duration.  No indication for sinusitis.  She will trial over-the-counter Claritin.  If not improving she will let us know.  Osteoporosis screening She declines DEXA scan at this time.  Health maintenance: Patient will get her tetanus vaccine at the pharmacy.   I discussed the assessment and treatment plan with the patient. The patient was provided an opportunity to ask questions and all were answered. The patient agreed with the plan and demonstrated an understanding of the instructions.   The patient was advised to call back or seek an in-person evaluation if the symptoms worsen or if the condition fails to improve as anticipated.  I provided 21 minutes of non-face-to-face time during this encounter.   Tommi Rumps, MD

## 2019-03-31 NOTE — Assessment & Plan Note (Signed)
She declines DEXA scan at this time.

## 2019-03-31 NOTE — Assessment & Plan Note (Signed)
This has been going on for about 4 weeks.  I suspect there is an allergic component.  Unlikely to be a viral illness given duration.  No indication for sinusitis.  She will trial over-the-counter Claritin.  If not improving she will let us know.

## 2019-03-31 NOTE — Assessment & Plan Note (Signed)
Adequately controlled.  Continue Synthroid.

## 2019-05-13 ENCOUNTER — Telehealth: Payer: Self-pay | Admitting: Family Medicine

## 2019-05-13 ENCOUNTER — Other Ambulatory Visit: Payer: Self-pay | Admitting: Family Medicine

## 2019-05-13 NOTE — Telephone Encounter (Signed)
I called the pharmacy to verify that they have the RX it was written on 03/30/2019, the pharmacy stated it was ready for pickup I then called the patient and informed her that her medication was ready for pick up and she understood.  Nina,cma

## 2019-05-13 NOTE — Telephone Encounter (Signed)
Pt said she only has 2 pills left of the olmesartan and the pharmacy told her she needed to contact us for her refill.

## 2019-06-04 ENCOUNTER — Telehealth: Payer: Self-pay | Admitting: Family Medicine

## 2019-06-04 NOTE — Telephone Encounter (Signed)
I called the patient and her voicemail has not been setup.  Nina,cma

## 2019-06-04 NOTE — Telephone Encounter (Signed)
Pt called in an wants to talk to someone about when she thinks of something and wants to say it she can't. She thinks might be losing her marbles. Please give patient a call back.

## 2019-06-12 NOTE — Telephone Encounter (Signed)
I called because this message was very concerning to me. It turns out this was not a 911 call according to patient. She said that she really just wants to talk to you because she concerned about her "going blank". She said that she will be talking & all of sudden all she wants to say will just leave her. She can't find the words to say or remember peoples names at times. She wants to find out or have some insight about what might be going on with her.   She is scheduled to see you next Friday.

## 2019-06-12 NOTE — Telephone Encounter (Signed)
Noted. Thanks for calling her. I'll discuss at her visit.

## 2019-06-20 ENCOUNTER — Encounter (INDEPENDENT_AMBULATORY_CARE_PROVIDER_SITE_OTHER): Payer: Self-pay

## 2019-06-20 ENCOUNTER — Ambulatory Visit: Payer: Medicare HMO

## 2019-06-20 ENCOUNTER — Ambulatory Visit (INDEPENDENT_AMBULATORY_CARE_PROVIDER_SITE_OTHER): Payer: Medicare HMO | Admitting: Family Medicine

## 2019-06-20 ENCOUNTER — Encounter: Payer: Self-pay | Admitting: Family Medicine

## 2019-06-20 ENCOUNTER — Ambulatory Visit (INDEPENDENT_AMBULATORY_CARE_PROVIDER_SITE_OTHER): Payer: Medicare HMO

## 2019-06-20 ENCOUNTER — Other Ambulatory Visit: Payer: Self-pay

## 2019-06-20 VITALS — Ht <= 58 in | Wt 108.0 lb

## 2019-06-20 VITALS — Ht <= 58 in | Wt 110.0 lb

## 2019-06-20 DIAGNOSIS — I10 Essential (primary) hypertension: Secondary | ICD-10-CM | POA: Diagnosis not present

## 2019-06-20 DIAGNOSIS — Z0000 Encounter for general adult medical examination without abnormal findings: Secondary | ICD-10-CM

## 2019-06-20 DIAGNOSIS — R413 Other amnesia: Secondary | ICD-10-CM

## 2019-06-20 DIAGNOSIS — Z13820 Encounter for screening for osteoporosis: Secondary | ICD-10-CM

## 2019-06-20 DIAGNOSIS — R0600 Dyspnea, unspecified: Secondary | ICD-10-CM | POA: Diagnosis not present

## 2019-06-20 DIAGNOSIS — R0609 Other forms of dyspnea: Secondary | ICD-10-CM

## 2019-06-20 DIAGNOSIS — M898X9 Other specified disorders of bone, unspecified site: Secondary | ICD-10-CM | POA: Diagnosis not present

## 2019-06-20 NOTE — Progress Notes (Signed)
I have reviewed the above note and agree.  Izyk Marty, M.D.  

## 2019-06-20 NOTE — Patient Instructions (Addendum)
  Jane Davis , Thank you for taking time to come for your Medicare Wellness Visit. I appreciate your ongoing commitment to your health goals. Please review the following plan we discussed and let me know if I can assist you in the future.   These are the goals we discussed: Goals    . Increase physical activity     Walk for exercise 5 days weekly, 30 minutes        This is a list of the screening recommended for you and due dates:  Health Maintenance  Topic Date Due  . DEXA scan (bone density measurement)  Never done  . Tetanus Vaccine  05/02/2028  . Flu Shot  Completed  . Pneumonia vaccines  Completed

## 2019-06-20 NOTE — Progress Notes (Signed)
Subjective:   Jane Davis is a 86 y.o. female who presents for Medicare Annual (Subsequent) preventive examination.  Review of Systems:  No ROS.  Medicare Wellness Virtual Visit.  Visual/audio telehealth visit, UTA vital signs.   See social history for additional risk factors.   Cardiac Risk Factors include: advanced age (>67men, >70 women);hypertension     Objective:     Vitals: Ht 4\' 10"  (1.473 m)   Wt 108 lb (49 kg)   BMI 22.57 kg/m   Body mass index is 22.57 kg/m.  Advanced Directives 06/20/2019 06/18/2018 05/17/2018 06/14/2017 02/07/2016  Does Patient Have a Medical Advance Directive? No No No Yes No  Type of Advance Directive - - Public librarian -  Does patient want to make changes to medical advance directive? - - - No - Patient declined -  Copy of [place] in Chart? - - - No - copy requested -  Would patient like information on creating a medical advance directive? No - Patient declined No - Patient declined No - Patient declined - Yes - Scientist, clinical (histocompatibility and immunogenetics) given    Tobacco Social History   Tobacco Use  Smoking Status Former Smoker  . Packs/day: 1.00  . Years: 40.00  . Pack years: 40.00  . Types: Cigarettes  . Quit date: 01/11/2016  . Years since quitting: 3.4  Smokeless Tobacco Never Used     Counseling given: Not Answered   Clinical Intake:  Pre-visit preparation completed: Yes        Diabetes: No  How often do you need to have someone help you when you read instructions, pamphlets, or other written materials from your doctor or pharmacy?: 2 - Rarely  Interpreter Needed?: No     Past Medical History:  Diagnosis Date  . COPD (chronic obstructive pulmonary disease) ([place])   . Essential hypertension 03/22/2016  . Glaucoma   . Hypertension   . Hypothyroidism   . Hypoxia 02/11/2016   Overview:  Discharged from hospital on home oxygen.  . Palpitations 03/22/2016  . Prediabetes 02/19/2015  . Rectal  lesion 05/29/2016  . Urge incontinence of urine 05/29/2016   Past Surgical History:  Procedure Laterality Date  . ABDOMINAL HYSTERECTOMY    . COLONOSCOPY  2013   [place]    Family History  Problem Relation Age of Onset  . Intracerebral hemorrhage Mother   . Cancer Sister   . Cancer Brother   . Bladder Cancer Neg Hx   . Prostate cancer Neg Hx    Social History   Socioeconomic History  . Marital status: Widowed    Spouse name: Not on file  . Number of children: Not on file  . Years of education: Not on file  . Highest education level: Not on file  Occupational History  . Not on file  Tobacco Use  . Smoking status: Former Smoker    Packs/day: 1.00    Years: 40.00    Pack years: 40.00    Types: Cigarettes    Quit date: 01/11/2016    Years since quitting: 3.4  . Smokeless tobacco: Never Used  Substance and Sexual Activity  . Alcohol use: No  . Drug use: No  . Sexual activity: Never  Other Topics Concern  . Not on file  Social History Narrative  . Not on file   Social Determinants of Health   Financial Resource Strain:   . Difficulty of Paying Living Expenses:   Food Insecurity:   .  Worried About Charity fundraiser in the Last Year:   . Arboriculturist in the Last Year:   Transportation Needs:   . Film/video editor (Medical):   Marland Kitchen Lack of Transportation (Non-Medical):   Physical Activity:   . Days of Exercise per Week:   . Minutes of Exercise per Session:   Stress:   . Feeling of Stress :   Social Connections:   . Frequency of Communication with Friends and Family:   . Frequency of Social Gatherings with Friends and Family:   . Attends Religious Services:   . Active Member of Clubs or Organizations:   . Attends Archivist Meetings:   Marland Kitchen Marital Status:     Outpatient Encounter Medications as of 06/20/2019  Medication Sig  . albuterol (ACCUNEB) 1.25 MG/3ML nebulizer solution USE 1 VIAL IN NEBULIZER EVERY 6 HOURS AS NEEDED  . aspirin EC 81  MG tablet Take 81 mg by mouth daily.  Marland Kitchen latanoprost (XALATAN) 0.005 % ophthalmic solution Place 1 drop into both eyes at bedtime.   Marland Kitchen levothyroxine (SYNTHROID) 75 MCG tablet Take 1 tablet (75 mcg total) by mouth daily.  Marland Kitchen olmesartan (BENICAR) 20 MG tablet Take 1 tablet (20 mg total) by mouth daily.  . Timolol Maleate 0.5 % (DAILY) SOLN Place 1 drop into the right eye daily.   No facility-administered encounter medications on file as of 06/20/2019.    Activities of Daily Living In your present state of health, do you have any difficulty performing the following activities: 06/20/2019  Hearing? N  Vision? N  Difficulty concentrating or making decisions? Y  Comment Notes difficulty with memory during conversation.  Walking or climbing stairs? N  Dressing or bathing? N  Doing errands, shopping? N  Preparing Food and eating ? N  Using the Toilet? N  In the past six months, have you accidently leaked urine? Y  Comment Managed with daily pad  Do you have problems with loss of bowel control? N  Managing your Medications? N  Managing your Finances? N  Housekeeping or managing your Housekeeping? N  Some recent data might be hidden    Patient Care Team: Leone Haven, MD as PCP - General (Family Medicine) Leone Haven, MD as Consulting Physician (Family Medicine) Christene Lye, MD (General Surgery)    Assessment:   This is a routine wellness examination for Jane Davis.  Nurse connected with patient 06/20/19 at 11:00 AM EST by a telephone enabled telemedicine application and verified that I am speaking with the correct person using two identifiers. Patient stated full name and DOB. Patient gave permission to continue with virtual visit. Patient's location was at home and Nurse's location was at [place] office.   Patient is alert and oriented x3. Patient notes difficulty with memory during conversation with friends.  Patient likes to read for brain stimulation.   Health  Maintenance Due: -Dexa Scan- deferred per patient request. Reports "achy bones" and wants to discuss with pcp. See completed HM at the end of note.   Eye: Visual acuity not assessed. Virtual visit. Followed by their ophthalmologist. Glaucoma.  Dental: UTD  Hearing: Demonstrates normal hearing during visit.  Safety:  Patient feels safe at home- yes. Lives with her son. Patient does have smoke detectors at home- yes Patient does wear sunscreen or protective clothing when in direct sunlight - yes Patient does wear seat belt when in a moving vehicle - yes Patient drives- yes Adequate lighting in walkways free  from debris- yes Grab bars and handrails used as appropriate- yes Ambulates with an assistive device- no Cell phone on person when ambulating outside of the home- yes  Social: Alcohol intake - no      Smoking history-  former  Smokers in home? none Illicit drug use? none  Medication: Taking as directed and without issues.  Self managed - yes   Covid-19: Precautions and sickness symptoms discussed. Wears mask, social distancing, hand hygiene as appropriate.   Activities of Daily Living Patient denies needing assistance with: household chores, feeding themselves, getting from bed to chair, getting to the toilet, bathing/showering, dressing, managing money, or preparing meals.   Discussed the importance of a healthy diet, water intake and the benefits of aerobic exercise.  Physical activity- active outside in the garden and in the home. No routine.  Diet:  Regular. States appetite is okay. Boost/Ensure encouraged between meals or as supplement. 2lb weight loss since last visit.  Water: good intake Caffeine: 2 cups of coffee, 1 diet coke  Other Providers Patient Care Team: Leone Haven, MD as PCP - General (Family Medicine) Caryl Bis Angela Adam, MD as Consulting Physician (Family Medicine) Christene Lye, MD (General Surgery)  Exercise Activities and  Dietary recommendations Current Exercise Habits: The patient does not participate in regular exercise at present  Goals    . Increase physical activity     Walk for exercise 5 days weekly, 30 minutes        Fall Risk Fall Risk  06/20/2019 03/31/2019 10/30/2018 05/01/2018 05/25/2017  Falls in the past year? 0 0 0 0 No  Number falls in past yr: - 0 - 0 -  Injury with Fall? - - - 0 -  Follow up Falls evaluation completed Falls evaluation completed - - -    Timed Get Up and Go performed: no, virtual visit  Depression Screen PHQ 2/9 Scores 06/20/2019 03/31/2019 10/30/2018 05/01/2018  PHQ - 2 Score 0 0 0 0     Cognitive Function MMSE - Mini Mental State Exam 06/14/2017  Orientation to time 5  Orientation to Place 5  Registration 3  Attention/ Calculation 4  Attention/Calculation-comments Some difficulty with calculation  Recall 1  Recall-comments 1 out of 3 words remembered  Language- name 2 objects 2  Language- repeat 1  Language- follow 3 step command 3  Language- read & follow direction 1  Write a sentence 1  Copy design 1  Total score 27     6CIT Screen 06/20/2019 06/18/2018  What Year? 0 points 0 points  What month? 0 points 0 points  What time? 0 points 0 points  Count back from 20 0 points 0 points  Months in reverse 4 points 0 points  Repeat phrase 4 points 0 points  Total Score 8 0    Immunization History  Administered Date(s) Administered  . Influenza, High Dose Seasonal PF 02/15/2017, 03/20/2018  . Influenza, Quadrivalent, Recombinant, Inj, Pf 01/25/2019  . Influenza-Unspecified 02/06/2015, 02/01/2016, 01/09/2019  . Pneumococcal Conjugate-13 06/14/2017  . Pneumococcal Polysaccharide-23 12/07/2014  . Tdap 05/02/2018   Screening Tests Health Maintenance  Topic Date Due  . DEXA SCAN  Never done  . TETANUS/TDAP  05/02/2028  . INFLUENZA VACCINE  Completed  . PNA vac Low Risk Adult  Completed      Plan:   Keep all routine maintenance appointments.    Follow up with your doctor today @ 2:15.   Medicare Attestation I have personally reviewed: The patient's medical and  social history Their use of alcohol, tobacco or illicit drugs Their current medications and supplements The patient's functional ability including ADLs,fall risks, home safety risks, cognitive, and hearing and visual impairment Diet and physical activities Evidence for depression    I have reviewed and discussed with patient certain preventive protocols, quality metrics, and best practice recommendations.      Varney Biles, LPN  [id number]

## 2019-06-20 NOTE — Progress Notes (Signed)
Virtual Visit via telephone note  This visit type was conducted due to national recommendations for restrictions regarding the COVID-19 pandemic (e.g. social distancing).  This format is felt to be most appropriate for this patient at this time.  All issues noted in this document were discussed and addressed.  No physical exam was performed (except for noted visual exam findings with Video Visits).   I connected with Jane Davis today at  2:15 PM EST by telephone and verified that I am speaking with the correct person using two identifiers. Location patient: home Location provider: home office Persons participating in the virtual visit: patient, provider  I discussed the limitations, risks, security and privacy concerns of performing an evaluation and management service by telephone and the availability of in person appointments. I also discussed with the patient that there may be a patient responsible charge related to this service. The patient expressed understanding and agreed to proceed.  Interactive audio and video telecommunications were attempted between this provider and patient, however failed, due to patient having technical difficulties OR patient did not have access to video capability.  We continued and completed visit with audio only.   Reason for visit: Follow-up.  HPI: Memory difficulty: Patient notes this as been going on for some time now.  She feels like she blinks her eyes and her thought is gone.  She cannot find words at times.  She will forget things short-term.  No ADL issues.  No depression.  Aching bones: Patient notes her hips and feet and legs ache.  It keeps her from sleeping at times.  Notes her muscles feel fine.  She is due for a DEXA scan.  Hypertension: Not checking her blood pressure.  Taking Benicar.  No chest pain.  She does feel mildly short of breath with exertion though not at other times.  No wheezing.  Does have occasional cough.  No fevers.  She  has not been walking as much anymore and thinks it may be related to that.  Notes the breathing issue has been going on for some time now.  No edema.   ROS: See pertinent positives and negatives per HPI.  Past Medical History:  Diagnosis Date  . COPD (chronic obstructive pulmonary disease) ([place])   . Essential hypertension 03/22/2016  . Glaucoma   . Hypertension   . Hypothyroidism   . Hypoxia 02/11/2016   Overview:  Discharged from hospital on home oxygen.  . Palpitations 03/22/2016  . Prediabetes 02/19/2015  . Rectal lesion 05/29/2016  . Urge incontinence of urine 05/29/2016    Past Surgical History:  Procedure Laterality Date  . ABDOMINAL HYSTERECTOMY    . COLONOSCOPY  2013   [place]     Family History  Problem Relation Age of Onset  . Intracerebral hemorrhage Mother   . Cancer Sister   . Cancer Brother   . Bladder Cancer Neg Hx   . Prostate cancer Neg Hx     SOCIAL HX: Former smoker   Current Outpatient Medications:  .  albuterol (ACCUNEB) 1.25 MG/3ML nebulizer solution, USE 1 VIAL IN NEBULIZER EVERY 6 HOURS AS NEEDED, Disp: 75 vial, Rfl: 1 .  aspirin EC 81 MG tablet, Take 81 mg by mouth daily., Disp: , Rfl:  .  latanoprost (XALATAN) 0.005 % ophthalmic solution, Place 1 drop into both eyes at bedtime. , Disp: , Rfl:  .  levothyroxine (SYNTHROID) 75 MCG tablet, Take 1 tablet (75 mcg total) by mouth daily., Disp: 90 tablet, Rfl: 1 .  olmesartan (BENICAR) 20 MG tablet, Take 1 tablet (20 mg total) by mouth daily., Disp: 90 tablet, Rfl: 1 .  Timolol Maleate 0.5 % (DAILY) SOLN, Place 1 drop into the right eye daily., Disp: , Rfl:   EXAM: This was a telehealth telephone visit and thus no physical exam was completed.  ASSESSMENT AND PLAN:  Discussed the following assessment and plan:  Memory difficulty We will check her TSH and B12.  We will complete a slums test at her next visit.  Bone pain We will obtain lab work to look at an alk phos and vitamin D.  We will  also obtain a DEXA scan.  We will complete an exam at her next office visit.  Essential hypertension Undetermined control.  Plan to check at next visit.  She will continue her Benicar.  Dyspnea Only occurs on exertion.  Could be related to her history of smoking.  Will obtain lab work to evaluate for potential causes.  Chest x-ray to be obtained as well.  Consider further evaluation based on results.  Osteoporosis screening Bone density scan ordered.   Orders Placed This Encounter  Procedures  . DG Bone Density    Standing Status:   Future    Standing Expiration Date:   08/21/2020    Order Specific Question:   Reason for Exam (SYMPTOM  OR DIAGNOSIS REQUIRED)    Answer:   osteoporosis screening    Order Specific Question:   Preferred imaging location?    Answer:   [facility]  . CBC    Standing Status:   Future    Standing Expiration Date:   06/21/2020  . Comp Met (CMET)    Standing Status:   Future    Standing Expiration Date:   06/21/2020  . TSH    Standing Status:   Future    Standing Expiration Date:   06/21/2020  . Vitamin D (25 hydroxy)    Standing Status:   Future    Standing Expiration Date:   06/21/2020  . Vitamin B12    Standing Status:   Future    Standing Expiration Date:   06/21/2020    No orders of the defined types were placed in this encounter.    I discussed the assessment and treatment plan with the patient. The patient was provided an opportunity to ask questions and all were answered. The patient agreed with the plan and demonstrated an understanding of the instructions.   The patient was advised to call back or seek an in-person evaluation if the symptoms worsen or if the condition fails to improve as anticipated.  I provided 12 minutes of non-face-to-face time during this encounter.   Tommi Rumps, MD

## 2019-06-22 DIAGNOSIS — R413 Other amnesia: Secondary | ICD-10-CM | POA: Insufficient documentation

## 2019-06-22 DIAGNOSIS — R0600 Dyspnea, unspecified: Secondary | ICD-10-CM | POA: Insufficient documentation

## 2019-06-22 DIAGNOSIS — M898X9 Other specified disorders of bone, unspecified site: Secondary | ICD-10-CM | POA: Insufficient documentation

## 2019-06-22 NOTE — Assessment & Plan Note (Signed)
Undetermined control.  Plan to check at next visit.  She will continue her Benicar.

## 2019-06-22 NOTE — Assessment & Plan Note (Signed)
Bone density scan ordered.

## 2019-06-22 NOTE — Assessment & Plan Note (Signed)
Only occurs on exertion.  Could be related to her history of smoking.  Will obtain lab work to evaluate for potential causes.  Chest x-ray to be obtained as well.  Consider further evaluation based on results.

## 2019-06-22 NOTE — Assessment & Plan Note (Signed)
We will check her TSH and B12.  We will complete a slums test at her next visit.

## 2019-06-22 NOTE — Assessment & Plan Note (Signed)
We will obtain lab work to look at an alk phos and vitamin D.  We will also obtain a DEXA scan.  We will complete an exam at her next office visit.

## 2019-06-23 ENCOUNTER — Telehealth: Payer: Self-pay | Admitting: Family Medicine

## 2019-06-23 NOTE — Telephone Encounter (Signed)
Spoke with son, Gracelyn Nurse [phone], HIPAA compliant. Expressed concerns regarding patients overall well being living alone and need for Home Health Care. Notes she could use assistance bathing and preparing meals. Confirms insurance will cover up to 100% and would like to pursue at this time. Requests he be contacted for questions or concerns as this is a sensitive matter for the patient.

## 2019-06-23 NOTE — Telephone Encounter (Signed)
Pt's son called and wanted to speak to you about her recent visit with you. He states that he is POA and has a few questions.

## 2019-06-24 ENCOUNTER — Encounter: Payer: Self-pay | Admitting: Emergency Medicine

## 2019-06-24 ENCOUNTER — Inpatient Hospital Stay
Admission: EM | Admit: 2019-06-24 | Discharge: 2019-06-26 | DRG: 189 | Disposition: A | Discharge status: Home / Self Care | Payer: Medicare HMO | Attending: Internal Medicine | Admitting: Internal Medicine

## 2019-06-24 ENCOUNTER — Inpatient Hospital Stay: Payer: Medicare HMO

## 2019-06-24 ENCOUNTER — Emergency Department: Payer: Medicare HMO

## 2019-06-24 ENCOUNTER — Other Ambulatory Visit: Payer: Self-pay

## 2019-06-24 ENCOUNTER — Telehealth: Payer: Self-pay | Admitting: Family Medicine

## 2019-06-24 DIAGNOSIS — J439 Emphysema, unspecified: Secondary | ICD-10-CM | POA: Diagnosis present

## 2019-06-24 DIAGNOSIS — Z79890 Hormone replacement therapy (postmenopausal): Secondary | ICD-10-CM | POA: Diagnosis not present

## 2019-06-24 DIAGNOSIS — G629 Polyneuropathy, unspecified: Secondary | ICD-10-CM | POA: Diagnosis present

## 2019-06-24 DIAGNOSIS — R911 Solitary pulmonary nodule: Secondary | ICD-10-CM | POA: Diagnosis not present

## 2019-06-24 DIAGNOSIS — R7303 Prediabetes: Secondary | ICD-10-CM | POA: Diagnosis not present

## 2019-06-24 DIAGNOSIS — H409 Unspecified glaucoma: Secondary | ICD-10-CM | POA: Diagnosis not present

## 2019-06-24 DIAGNOSIS — R52 Pain, unspecified: Secondary | ICD-10-CM

## 2019-06-24 DIAGNOSIS — M79661 Pain in right lower leg: Secondary | ICD-10-CM | POA: Diagnosis not present

## 2019-06-24 DIAGNOSIS — R918 Other nonspecific abnormal finding of lung field: Secondary | ICD-10-CM | POA: Diagnosis present

## 2019-06-24 DIAGNOSIS — Z87891 Personal history of nicotine dependence: Secondary | ICD-10-CM

## 2019-06-24 DIAGNOSIS — Z79899 Other long term (current) drug therapy: Secondary | ICD-10-CM | POA: Diagnosis not present

## 2019-06-24 DIAGNOSIS — Z7982 Long term (current) use of aspirin: Secondary | ICD-10-CM | POA: Diagnosis not present

## 2019-06-24 DIAGNOSIS — R0902 Hypoxemia: Secondary | ICD-10-CM | POA: Diagnosis not present

## 2019-06-24 DIAGNOSIS — J9611 Chronic respiratory failure with hypoxia: Secondary | ICD-10-CM

## 2019-06-24 DIAGNOSIS — I10 Essential (primary) hypertension: Secondary | ICD-10-CM | POA: Diagnosis not present

## 2019-06-24 DIAGNOSIS — Z20822 Contact with and (suspected) exposure to covid-19: Secondary | ICD-10-CM | POA: Diagnosis present

## 2019-06-24 DIAGNOSIS — J9601 Acute respiratory failure with hypoxia: Principal | ICD-10-CM | POA: Diagnosis present

## 2019-06-24 DIAGNOSIS — M25551 Pain in right hip: Secondary | ICD-10-CM | POA: Diagnosis not present

## 2019-06-24 DIAGNOSIS — M79662 Pain in left lower leg: Secondary | ICD-10-CM | POA: Diagnosis not present

## 2019-06-24 DIAGNOSIS — E039 Hypothyroidism, unspecified: Secondary | ICD-10-CM | POA: Diagnosis present

## 2019-06-24 LAB — CBC WITH DIFFERENTIAL/PLATELET
Abs Immature Granulocytes: 0.06 10*3/uL (ref 0.00–0.07)
Basophils Absolute: 0 10*3/uL (ref 0.0–0.1)
Basophils Relative: 0 %
Eosinophils Absolute: 0.1 10*3/uL (ref 0.0–0.5)
Eosinophils Relative: 1 %
HCT: 43.4 % (ref 36.0–46.0)
Hemoglobin: 14.2 g/dL (ref 12.0–15.0)
Immature Granulocytes: 1 %
Lymphocytes Relative: 7 %
Lymphs Abs: 0.8 10*3/uL (ref 0.7–4.0)
MCH: 30.9 pg (ref 26.0–34.0)
MCHC: 32.7 g/dL (ref 30.0–36.0)
MCV: 94.6 fL (ref 80.0–100.0)
Monocytes Absolute: 0.6 10*3/uL (ref 0.1–1.0)
Monocytes Relative: 6 %
Neutro Abs: 9.1 10*3/uL — ABNORMAL HIGH (ref 1.7–7.7)
Neutrophils Relative %: 85 %
Platelets: 277 10*3/uL (ref 150–400)
RBC: 4.59 MIL/uL (ref 3.87–5.11)
RDW: 11.6 % (ref 11.5–15.5)
WBC: 10.6 10*3/uL — ABNORMAL HIGH (ref 4.0–10.5)
nRBC: 0 % (ref 0.0–0.2)

## 2019-06-24 LAB — COMPREHENSIVE METABOLIC PANEL
ALT: 13 U/L (ref 0–44)
AST: 23 U/L (ref 15–41)
Albumin: 4.4 g/dL (ref 3.5–5.0)
Alkaline Phosphatase: 83 U/L (ref 38–126)
Anion gap: 7 (ref 5–15)
BUN: 11 mg/dL (ref 8–23)
CO2: 33 mmol/L — ABNORMAL HIGH (ref 22–32)
Calcium: 9.5 mg/dL (ref 8.9–10.3)
Chloride: 99 mmol/L (ref 98–111)
Creatinine, Ser: 0.45 mg/dL (ref 0.44–1.00)
GFR calc Af Amer: >60 mL/min (ref >60)
GFR calc non Af Amer: >60 mL/min (ref >60)
Glucose, Bld: 114 mg/dL — ABNORMAL HIGH (ref 70–99)
Potassium: 4.5 mmol/L (ref 3.5–5.1)
Sodium: 139 mmol/L (ref 135–145)
Total Bilirubin: 0.6 mg/dL (ref 0.3–1.2)
Total Protein: 7.8 g/dL (ref 6.5–8.1)

## 2019-06-24 LAB — TROPONIN I (HIGH SENSITIVITY)
Troponin I (High Sensitivity): 24 ng/L — ABNORMAL HIGH (ref <18)
Troponin I (High Sensitivity): 29 ng/L — ABNORMAL HIGH (ref <18)

## 2019-06-24 LAB — BRAIN NATRIURETIC PEPTIDE: B Natriuretic Peptide: 48 pg/mL (ref 0.0–100.0)

## 2019-06-24 MED ORDER — IPRATROPIUM-ALBUTEROL 0.5-2.5 (3) MG/3ML IN SOLN
3.0000 mL | Freq: Once | RESPIRATORY_TRACT | Status: AC
  Administered 2019-06-24: 3 mL via RESPIRATORY_TRACT
  Filled 2019-06-24: qty 3

## 2019-06-24 MED ORDER — IRBESARTAN 150 MG PO TABS
150.0000 mg | ORAL_TABLET | Freq: Every day | ORAL | Status: DC
  Administered 2019-06-25 – 2019-06-26 (×2): 150 mg via ORAL
  Filled 2019-06-24 (×2): qty 1

## 2019-06-24 MED ORDER — TIMOLOL MALEATE 0.5 % OP SOLN
1.0000 [drp] | Freq: Every day | OPHTHALMIC | Status: DC
  Administered 2019-06-25 – 2019-06-26 (×2): 1 [drp] via OPHTHALMIC
  Filled 2019-06-24: qty 5

## 2019-06-24 MED ORDER — LEVOTHYROXINE SODIUM 50 MCG PO TABS
75.0000 ug | ORAL_TABLET | Freq: Every day | ORAL | Status: DC
  Administered 2019-06-25 – 2019-06-26 (×2): 75 ug via ORAL
  Filled 2019-06-24 (×2): qty 2

## 2019-06-24 MED ORDER — ASPIRIN EC 81 MG PO TBEC
81.0000 mg | DELAYED_RELEASE_TABLET | Freq: Every day | ORAL | Status: DC
  Administered 2019-06-24 – 2019-06-26 (×3): 81 mg via ORAL
  Filled 2019-06-24 (×3): qty 1

## 2019-06-24 MED ORDER — IOHEXOL 350 MG/ML SOLN
75.0000 mL | Freq: Once | INTRAVENOUS | Status: AC | PRN
  Administered 2019-06-24: 75 mL via INTRAVENOUS

## 2019-06-24 MED ORDER — LATANOPROST 0.005 % OP SOLN
1.0000 [drp] | Freq: Every day | OPHTHALMIC | Status: DC
  Administered 2019-06-25: 1 [drp] via OPHTHALMIC
  Filled 2019-06-24: qty 2.5

## 2019-06-24 MED ORDER — ENOXAPARIN SODIUM 40 MG/0.4ML ~~LOC~~ SOLN: 40.0000 mg | SUBCUTANEOUS | Status: DC

## 2019-06-24 NOTE — Telephone Encounter (Signed)
Do we have a copy of the POA? Is he requesting a personal care service or home health? If it is personal care I believe they would have to arrange for that and the insurance company typically has paperwork that needs to be filled out by me.

## 2019-06-24 NOTE — Telephone Encounter (Signed)
Son, Jeneen Rinks is DPR on the chart. States he has POA documentation in his possession and will bring to the office with personal care paperwork from the insurance company. Aware there will be no filing of documentation without POA information or direct conversation with patient regarding request listed below.

## 2019-06-24 NOTE — Telephone Encounter (Signed)
Noted.  Agree with ED evaluation.

## 2019-06-24 NOTE — ED Notes (Signed)
Patient provided with recliner chair for comfort, explained to patient that she is waiting on a bed upstairs but it may take a little while. Assisted to restroom, provided urine sample that spilled when attempting to hand to this RN. NAD

## 2019-06-24 NOTE — ED Triage Notes (Signed)
Pt comes into the ED via EMS from home with c/o BL LE pain for the past 2 weeks and today became shaky and sweaty. EMS reports pt was ambulatory from the house to the truck upon there arrival without difficulty.

## 2019-06-24 NOTE — Telephone Encounter (Signed)
Gracelyn Nurse states that pt has chills, fever of 101, oxygen sat 81%. I transferred to [facility] at Access Nurse due to oxygen level.

## 2019-06-24 NOTE — ED Notes (Signed)
Patient provided with meal tray and water. Remains in recliner reading a book

## 2019-06-24 NOTE — ED Notes (Signed)
Admitting provider at bedside.

## 2019-06-24 NOTE — Telephone Encounter (Signed)
Called and verified Paramedics in home now per Mr. Donnal Debar attaining vitals and will transport to ER.

## 2019-06-24 NOTE — ED Notes (Signed)
Pt O2 84% on RA. [place] notified. Pt placed on 2L O2 via [place]. Pt O2 up to 89%. Pt placed on 3L O2 and is at 97%.

## 2019-06-24 NOTE — ED Notes (Signed)
Found patient sitting on side of bed, connected patient to monitor and saw that her O2 sats were 78%. Placed on 2L [place], patient appears to be short of breath but says "she's more worried about her legs right now". Told patient she is OK to sit on side of bed but must leave O2 in right now. Will notify MD

## 2019-06-24 NOTE — ED Provider Notes (Signed)
[facility] Emergency Department Provider Note ____________________________________________   First MD Initiated Contact with Patient 06/24/19 1711     (approximate)  I have reviewed the triage vital signs and the nursing notes.   HISTORY  Chief Complaint Leg Pain    HPI Jane Davis is a 86 y.o. female with PMH as noted below who presents for multiple complaints, but primarily due to a low oxygen level and fever.  The patient reports some pain in bilateral legs over the last week with no trauma and no swelling.  She states sometimes it feels like they will give out on her.  She denies any shortness of breath, chest pain, abdominal pain, vomiting or diarrhea, or dysuria but she does have some mild suprapubic pain.  Past Medical History:  Diagnosis Date  . COPD (chronic obstructive pulmonary disease) ([place])   . Essential hypertension 03/22/2016  . Glaucoma   . Hypertension   . Hypothyroidism   . Hypoxia 02/11/2016   Overview:  Discharged from hospital on home oxygen.  . Palpitations 03/22/2016  . Prediabetes 02/19/2015  . Rectal lesion 05/29/2016  . Urge incontinence of urine 05/29/2016    Patient Active Problem List   Diagnosis Date Noted  . Acute respiratory failure with hypoxia ([place]) 06/24/2019  . Mass of lingula of lung 06/24/2019  . Memory difficulty 06/22/2019  . Bone pain 06/22/2019  . Dyspnea 06/22/2019  . Postnasal drip 03/31/2019  . Neuropathy 08/07/2016  . Urge incontinence of urine 05/29/2016  . Hemorrhoid 05/29/2016  . Hypothyroidism 03/22/2016  . Essential hypertension 03/22/2016  . Palpitations 03/22/2016  . Osteoporosis screening 10/06/2015  . Smoking 02/22/2015  . Prediabetes 02/19/2015  . Glaucoma 02/18/2015    Past Surgical History:  Procedure Laterality Date  . ABDOMINAL HYSTERECTOMY    . COLONOSCOPY  2013   [place]     Prior to Admission medications   Medication Sig Start Date End Date Taking?  Authorizing Provider  aspirin EC 81 MG tablet Take 81 mg by mouth daily.   Yes [provider]  latanoprost (XALATAN) 0.005 % ophthalmic solution Place 1 drop into both eyes at bedtime.    Yes [provider]  levothyroxine (SYNTHROID) 75 MCG tablet Take 1 tablet (75 mcg total) by mouth daily. 03/31/19  Yes Leone Haven, MD  olmesartan (BENICAR) 20 MG tablet Take 1 tablet (20 mg total) by mouth daily. 03/31/19  Yes Leone Haven, MD  Timolol Maleate 0.5 % (DAILY) SOLN Place 1 drop into the right eye daily.   Yes [provider]    Allergies Patient has no known allergies.  Family History  Problem Relation Age of Onset  . Intracerebral hemorrhage Mother   . Cancer Sister   . Cancer Brother   . Bladder Cancer Neg Hx   . Prostate cancer Neg Hx     Social History Social History   Tobacco Use  . Smoking status: Former Smoker    Packs/day: 1.00    Years: 40.00    Pack years: 40.00    Types: Cigarettes    Quit date: 01/11/2016    Years since quitting: 3.4  . Smokeless tobacco: Never Used  Substance Use Topics  . Alcohol use: No  . Drug use: No    Review of Systems  Constitutional: No fever/chills.  Positive for weakness. Eyes: No visual changes. ENT: No sore throat. Cardiovascular: Denies chest pain. Respiratory: Denies shortness of breath. Gastrointestinal: No vomiting or diarrhea.  Genitourinary: Negative  for dysuria.  Musculoskeletal: Negative for back pain. Skin: Negative for rash. Neurological: Negative for headache.   ____________________________________________   PHYSICAL EXAM:  VITAL SIGNS: ED Triage Vitals  Enc Vitals Group     BP 06/24/19 1652 (!) 170/70     Pulse Rate 06/24/19 1652 (!) 126     Resp 06/24/19 1652 (!) 24     Temp 06/24/19 1652 98.2 F (36.8 C)     Temp Source 06/24/19 1652 Oral     SpO2 06/24/19 1652 (!) 84 %     Weight 06/24/19 1725 109 lb (49.4 kg)     Height 06/24/19 1725 5' (1.524 m)      Head Circumference --      Peak Flow --      Pain Score --      Pain Loc --      Pain Edu? --      Excl. in [place]? --     Constitutional: Alert and oriented.  Relatively well appearing and in no acute distress. Eyes: Conjunctivae are normal.  Head: Atraumatic. Nose: No congestion/rhinnorhea. Mouth/Throat: Mucous membranes are moist.   Neck: Normal range of motion.  Cardiovascular: Tachycardic, regular rhythm. Grossly normal heart sounds.  Good peripheral circulation. Respiratory: Normal respiratory effort.  No retractions. Lungs CTAB. Gastrointestinal: Soft and nontender. No distention.  Genitourinary: No flank tenderness. Musculoskeletal: No lower extremity edema.  Extremities warm and well perfused.  Full range of motion of bilateral lower extremities. Neurologic:  Normal speech and language. No gross focal neurologic deficits are appreciated.  Skin:  Skin is warm and dry. No rash noted. Psychiatric: Mood and affect are normal. Speech and behavior are normal.  ____________________________________________   LABS (all labs ordered are listed, but only abnormal results are displayed)  Labs Reviewed  COMPREHENSIVE METABOLIC PANEL - Abnormal; Notable for the following components:      Result Value   CO2 33 (*)    Glucose, Bld 114 (*)    All other components within normal limits  CBC WITH DIFFERENTIAL/PLATELET - Abnormal; Notable for the following components:   WBC 10.6 (*)    Neutro Abs 9.1 (*)    All other components within normal limits  TROPONIN I (HIGH SENSITIVITY) - Abnormal; Notable for the following components:   Troponin I (High Sensitivity) 24 (*)    All other components within normal limits  TROPONIN I (HIGH SENSITIVITY) - Abnormal; Notable for the following components:   Troponin I (High Sensitivity) 29 (*)    All other components within normal limits  SARS CORONAVIRUS 2 (TAT 6-24 HRS)  BRAIN NATRIURETIC PEPTIDE  URINALYSIS, COMPLETE (UACMP) WITH MICROSCOPIC  BASIC  METABOLIC PANEL  CBC  HEMOGLOBIN A1C  TSH   ____________________________________________  EKG  ED ECG REPORT I, Arta Silence, the attending physician, personally viewed and interpreted this ECG.  Date: 06/24/2019 EKG Time: 1701 Rate: 108 Rhythm: Sinus tachycardia QRS Axis: normal Intervals: Incomplete RBBB ST/T Wave abnormalities: normal Narrative Interpretation: no evidence of acute ischemia  ____________________________________________  RADIOLOGY  CXR: No focal infiltrate or edema CT angio chest: No acute PE.  Mass in the lingula.  ____________________________________________   PROCEDURES  Procedure(s) performed: No  Procedures  Critical Care performed: No ____________________________________________   INITIAL IMPRESSION / ASSESSMENT AND PLAN / ED COURSE  Pertinent labs & imaging results that were available during my care of the patient were reviewed by me and considered in my medical decision making (see chart for details).  86 year old female with PMH as  noted above presents due to hypoxia and fever noted earlier as well as generalized weakness and a feeling of her legs giving out on her.  I reviewed the past medical records in [place].  The patient was last admitted last year with COPD exacerbation and hypoxia.  Per telephone notes from today, she was noted to have a fever of 101 and an O2 saturation in the low 80s on room air today.  The patient is not on home oxygen.  On exam, the patient is relatively well appearing for her age.  She has minimally increased work of breathing but no respiratory distress.  She is tachycardic and her O2 saturation is in the mid 80s on room air.  Her lungs are clear bilaterally.  There is no leg edema or tenderness.  Differential includes pneumonia, acute bronchitis, COVID-19, COPD exacerbation, or less likely new onset CHF or other cardiac etiology.  I have a low suspicion for PE given the patient's lack of chest pain or  significant shortness of breath, however if the work-up is otherwise negative the patient will warrant imaging to rule out PE.  We will obtain a lab work-up, keep the patient on oxygen, and reassess.  ----------------------------------------- 9:08 PM on 06/24/2019 -----------------------------------------  Chest x-ray showed no significant findings, so I obtained a CT chest.  This reveals a mass in the lingula but no PE or other findings to explain the hypoxia.  The patient remains hypoxic to as low as the high 70s on room air, although she does well on 2 L by nasal cannula.  Given the persistent hypoxia will admit for further work-up.  I discussed the case with hospitalist.  ____________________________________________   FINAL CLINICAL IMPRESSION(S) / ED DIAGNOSES  Final diagnoses:  Acute respiratory failure with hypoxia ([place])      NEW MEDICATIONS STARTED DURING THIS VISIT:  New Prescriptions   No medications on file     Note:  This document was prepared using Dragon voice recognition software and may include unintentional dictation errors.    Arta Silence, MD 06/24/19 2109

## 2019-06-24 NOTE — ED Triage Notes (Signed)
Pt presents to ED via [facility], see first RN note for report from EMS. When asked why patient came to ED pt states "a little bit of everything". Pt mostly c/o BLE pain. Upon arrival to ED pt noted to be 84% on RA, placed on 3L via [place], up to 98% on 3L via [place], pt denies wearing O2 at home.    When asked to clarify " a little bit of everything", pt states "well the legs mostly, well that's mostly it".   Pt is alert, oriented to time and person, disoriented to place, and disoriented to situation.

## 2019-06-24 NOTE — H&P (Signed)
History and Physical    Jane Davis CVE:[id number] DOB: Aug 06, 1933 DOA: 06/24/2019  PCP: Leone Haven, MD  Patient coming from: Home, live with son I have personally briefly reviewed patient's old medical records in [place]  Chief Complaint: leg pain  HPI: Jane Davis is a 86 y.o. female with medical history significant for COPD, hypothyroidism, hypertension and prediabetes who presents with concerns of bilateral lower extremity pain.  Patient overall had poor recall of the timeline of her symptoms.  States that she has felt bilateral leg pain for perhaps the past 2 to 3 weeks with the right being the worst.  The pain is constant and feels like "her legs are falling apart."  The pain to her right leg starts at the hip and goes all the way down to her feet.  She denies any numbness but has felt tingling.  She also fell a few days ago due to weakness and feeling like her legs "buckled from under her."  She denies any trauma.  Denies any previous surgery of her extremities or hips.  She also endorsed chronic shortness of breath with ambulation.  Felt like she has lost some weight but "not a lot."  Denies any chest pain.  Denies any changes in her appetite.  No nausea or vomiting or diarrhea.  She came into the ED today only because she talked to her son about her symptoms and he prompted her to present for evaluation.  She previously used tobacco but quit 3 years ago.  Had 50 years of smoking history of less than a pack per day.  Denies alcohol or illicit drug use.  ED Course: She was afebrile but hypertensive and tachycardic and had oxygen desaturation down to 70% on room air and required 2 L via nasal cannula. CBC with mild leukocytosis of 10.6.  Glucose of 114 but BMP otherwise unremarkable. Troponin XX 4.  BNP of 48.  Chest x-ray show emphysematous disease and a 2.5 cm oval opacity in the left perihilar region concerning for a lung nodule.  CTA  chest was obtained which showed no pulmonary embolism but there is a mass in the lingula concerning for lung carcinoma.  Review of Systems: Constitutional: No Weight Change, No Fever ENT/Mouth: No sore throat, No Rhinorrhea Eyes: No Eye Pain, No Vision Changes Cardiovascular: No Chest Pain, + SOB, No PND, +Dyspnea on Exertion, No Orthopnea, No Claudication, No Edema, No Palpitations Respiratory: No Cough, No Sputum, No Wheezing,  Gastrointestinal: No Nausea, No Vomiting, No Diarrhea, No Constipation, No Pain Genitourinary: no Urinary Incontinence Musculoskeletal: No Arthralgias, No Myalgias Skin: No Skin Lesions, No Pruritus, Neuro: + Weakness,+ Numbness,  No Loss of Consciousness, No Syncope Psych: No Anxiety/Panic, No Depression, no decrease appetite Heme/Lymph: No Bruising, No Bleeding  Past Medical History:  Diagnosis Date  . COPD (chronic obstructive pulmonary disease) ([place])   . Essential hypertension 03/22/2016  . Glaucoma   . Hypertension   . Hypothyroidism   . Hypoxia 02/11/2016   Overview:  Discharged from hospital on home oxygen.  . Palpitations 03/22/2016  . Prediabetes 02/19/2015  . Rectal lesion 05/29/2016  . Urge incontinence of urine 05/29/2016    Past Surgical History:  Procedure Laterality Date  . ABDOMINAL HYSTERECTOMY    . COLONOSCOPY  2013   [place]      reports that she quit smoking about 3 years ago. Her smoking use included cigarettes. She has a 40.00 pack-year smoking history. She has never used smokeless tobacco.  She reports that she does not drink alcohol or use drugs.  No Known Allergies  Family History  Problem Relation Age of Onset  . Intracerebral hemorrhage Mother   . Cancer Sister   . Cancer Brother   . Bladder Cancer Neg Hx   . Prostate cancer Neg Hx      Prior to Admission medications   Medication Sig Start Date End Date Taking? Authorizing Provider  aspirin EC 81 MG tablet Take 81 mg by mouth daily.   Yes [provider]   latanoprost (XALATAN) 0.005 % ophthalmic solution Place 1 drop into both eyes at bedtime.    Yes [provider]  levothyroxine (SYNTHROID) 75 MCG tablet Take 1 tablet (75 mcg total) by mouth daily. 03/31/19  Yes Leone Haven, MD  olmesartan (BENICAR) 20 MG tablet Take 1 tablet (20 mg total) by mouth daily. 03/31/19  Yes Leone Haven, MD  Timolol Maleate 0.5 % (DAILY) SOLN Place 1 drop into the right eye daily.   Yes [provider]    Physical Exam: Vitals:   06/24/19 1800 06/24/19 1830 06/24/19 1917 06/24/19 1924  BP: (!) 145/79 (!) 166/75 (!) 158/76   Pulse: (!) 102 97 (!) 125 (!) 106  Resp: (!) 29 (!) 25 (!) 25 20  Temp:      TempSrc:      SpO2: 100% 98% (!) 85% 100%  Weight:      Height:        Constitutional: NAD, calm, comfortable thin elderly female sitting at the side of her bed looking at her phone Vitals:   06/24/19 1800 06/24/19 1830 06/24/19 1917 06/24/19 1924  BP: (!) 145/79 (!) 166/75 (!) 158/76   Pulse: (!) 102 97 (!) 125 (!) 106  Resp: (!) 29 (!) 25 (!) 25 20  Temp:      TempSrc:      SpO2: 100% 98% (!) 85% 100%  Weight:      Height:       Eyes: PERRL, lids and conjunctivae normal ENMT: Mucous membranes are moist.  Neck: normal, supple Respiratory: Diminished breath sounds throughout with faint expiratory wheeze. Normal respiratory effort on 2 L. No accessory muscle use.  Cardiovascular: Regular rate and rhythm, no murmurs / rubs / gallops. No extremity edema. 2+ pedal pulses.  Abdomen: no tenderness, no masses palpated.  Bowel sounds positive.  Musculoskeletal: no clubbing / cyanosis. No joint deformity upper and lower extremities. Good ROM, no contractures. Normal muscle tone.  Pain with palpation of the right lateral hip. Skin: no rashes, lesions, ulcers. No induration Neurologic: CN 2-12 grossly intact. Sensation intact, Strength 5/5 in all 4.  Psychiatric: Normal judgment and insight. Alert and oriented x 3. Normal mood.      Labs on Admission: I have personally reviewed following labs and imaging studies  CBC: Recent Labs  Lab 06/24/19 1723  WBC 10.6*  NEUTROABS 9.1*  HGB 14.2  HCT 43.4  MCV 94.6  PLT 196   Basic Metabolic Panel: Recent Labs  Lab 06/24/19 1723  NA 139  K 4.5  CL 99  CO2 33*  GLUCOSE 114*  BUN 11  CREATININE 0.45  CALCIUM 9.5   GFR: Estimated Creatinine Clearance: 37.6 mL/min (by C-G formula based on SCr of 0.45 mg/dL). Liver Function Tests: Recent Labs  Lab 06/24/19 1723  AST 23  ALT 13  ALKPHOS 83  BILITOT 0.6  PROT 7.8  ALBUMIN 4.4   No results for input(s): LIPASE, AMYLASE in  the last 168 hours. No results for input(s): AMMONIA in the last 168 hours. Coagulation Profile: No results for input(s): INR, PROTIME in the last 168 hours. Cardiac Enzymes: No results for input(s): CKTOTAL, CKMB, CKMBINDEX, TROPONINI in the last 168 hours. BNP (last 3 results) No results for input(s): PROBNP in the last 8760 hours. HbA1C: No results for input(s): HGBA1C in the last 72 hours. CBG: No results for input(s): GLUCAP in the last 168 hours. Lipid Profile: No results for input(s): CHOL, HDL, LDLCALC, TRIG, CHOLHDL, LDLDIRECT in the last 72 hours. Thyroid Function Tests: No results for input(s): TSH, T4TOTAL, FREET4, T3FREE, THYROIDAB in the last 72 hours. Anemia Panel: No results for input(s): VITAMINB12, FOLATE, FERRITIN, TIBC, IRON, RETICCTPCT in the last 72 hours. Urine analysis:    Component Value Date/Time   APPEARANCEUR Clear 06/01/2016 0843   GLUCOSEU Negative 06/01/2016 0843   BILIRUBINUR Negative 06/01/2016 0843   PROTEINUR Negative 06/01/2016 0843   NITRITE Negative 06/01/2016 0843   LEUKOCYTESUR Negative 06/01/2016 0843    Radiological Exams on Admission: CT Angio Chest PE W and/or Wo Contrast  Result Date: 06/24/2019 CLINICAL DATA:  Hypoxia. Bilateral lower extremity pain. Nodule on chest radiography. EXAM: CT ANGIOGRAPHY CHEST WITH CONTRAST  TECHNIQUE: Multidetector CT imaging of the chest was performed using the standard protocol during bolus administration of intravenous contrast. Multiplanar CT image reconstructions and MIPs were obtained to evaluate the vascular anatomy. CONTRAST:  18mL OMNIPAQUE IOHEXOL 350 MG/ML SOLN COMPARISON:  Chest radiography same day and 05/17/2018 FINDINGS: Cardiovascular: There are no pulmonary emboli. Heart size is normal. There is coronary artery calcification. There is aortic atherosclerosis. Mediastinum/Nodes: There is left hilar lymphadenopathy with the largest node measuring 19 mm in diameter. Lungs/Pleura: There is pleural and parenchymal scarring, most pronounced at the lung apices. 4.0 x 2.3 x 1.9 cm mass in the lingula worrisome for lung carcinoma. Left hilar lymphadenopathy as described above similarly worrisome. Upper Abdomen: Negative Musculoskeletal: Ordinary thoracic degenerative changes. Review of the MIP images confirms the above findings. IMPRESSION: No pulmonary emboli. 4.0 x 2.3 x 1.9 cm mass in the lingula worrisome for lung carcinoma. Left hilar lymphadenopathy. Aortic aneurysm NOS (ICD10-I71.9). Electronically Signed   By: Nelson Chimes M.D.   On: 06/24/2019 19:15   DG Chest Portable 1 View  Result Date: 06/24/2019 CLINICAL DATA:  Hypoxia EXAM: PORTABLE CHEST 1 VIEW COMPARISON:  05/17/2018, 02/07/2016 FINDINGS: Hyperinflation with emphysematous disease and bronchitic changes. 2.5 cm oval opacity in the left perihilar region. Normal cardiomediastinal silhouette with aortic atherosclerosis. No pneumothorax. Apical pleural and parenchymal scarring. IMPRESSION: 1. Emphysematous disease with bronchitic changes. 2. 2.5 cm oval opacity in the left perihilar region, possibly representing a lung nodule. Chest CT is recommended for further evaluation. Electronically Signed   By: Donavan Foil M.D.   On: 06/24/2019 17:56    EKG: Independently reviewed.   Assessment/Plan  Acute hypoxic respiratory  failure possibly secondary to new left lung mass vs COPD exacerbation Admitted on 2 L via nasal cannula No significant findings on lung exam other than faint wheezing.  Will trial DuoNeb x1 Patient will need VIR consult in the morning for potential biopsy of lung mass  Bilateral lower extremity pain/neuropathy Will check hemoglobin A1c given diagnosis of prediabetes Will check right hip X-ray   Prediabetes Patient was unaware that she had this diagnosis.  Will check hemoglobin A1c  Hypertension Continue ACE-I  Hypothyroidism Continue levothyroxine  DVT prophylaxis: SCDs Code Status: Full Family Communication: Plan discussed with patient at bedside  disposition Plan: Home with at least 2 midnight stays  Consults called:  Admission status: inpatient   Rayvn Rickerson T Dashawn Golda DO [facility]   If 7PM-7AM, please contact night-coverage [email or web address]   06/24/2019, 8:49 PM

## 2019-06-24 NOTE — Progress Notes (Signed)
I called and informed the patient to go to the medical mall at [facility] to have her labs and xray done, she understood.  Lorra Freeman,cma

## 2019-06-25 ENCOUNTER — Telehealth: Payer: Self-pay | Admitting: Pulmonary Disease

## 2019-06-25 ENCOUNTER — Telehealth: Payer: Self-pay | Admitting: Family Medicine

## 2019-06-25 ENCOUNTER — Encounter: Payer: Self-pay | Admitting: Family Medicine

## 2019-06-25 ENCOUNTER — Other Ambulatory Visit: Payer: Self-pay

## 2019-06-25 ENCOUNTER — Inpatient Hospital Stay: Payer: Medicare HMO

## 2019-06-25 LAB — BLOOD GAS, ARTERIAL
Acid-Base Excess: 9.7 mmol/L — ABNORMAL HIGH (ref 0.0–2.0)
Bicarbonate: 34.9 mmol/L — ABNORMAL HIGH (ref 20.0–28.0)
FIO2: 0.21
O2 Saturation: 94.7 %
Patient temperature: 37
pCO2 arterial: 48 mmHg (ref 32.0–48.0)
pH, Arterial: 7.47 — ABNORMAL HIGH (ref 7.350–7.450)
pO2, Arterial: 69 mmHg — ABNORMAL LOW (ref 83.0–108.0)

## 2019-06-25 LAB — BASIC METABOLIC PANEL
Anion gap: 6 (ref 5–15)
BUN: 12 mg/dL (ref 8–23)
CO2: 33 mmol/L — ABNORMAL HIGH (ref 22–32)
Calcium: 9 mg/dL (ref 8.9–10.3)
Chloride: 99 mmol/L (ref 98–111)
Creatinine, Ser: 0.48 mg/dL (ref 0.44–1.00)
GFR calc Af Amer: >60 mL/min (ref >60)
GFR calc non Af Amer: >60 mL/min (ref >60)
Glucose, Bld: 91 mg/dL (ref 70–99)
Potassium: 4 mmol/L (ref 3.5–5.1)
Sodium: 138 mmol/L (ref 135–145)

## 2019-06-25 LAB — HEMOGLOBIN A1C
Hgb A1c MFr Bld: 5.8 % — ABNORMAL HIGH (ref 4.8–5.6)
Mean Plasma Glucose: 119.76 mg/dL

## 2019-06-25 LAB — CBC
HCT: 39 % (ref 36.0–46.0)
Hemoglobin: 12.6 g/dL (ref 12.0–15.0)
MCH: 30.7 pg (ref 26.0–34.0)
MCHC: 32.3 g/dL (ref 30.0–36.0)
MCV: 94.9 fL (ref 80.0–100.0)
Platelets: 234 10*3/uL (ref 150–400)
RBC: 4.11 MIL/uL (ref 3.87–5.11)
RDW: 11.5 % (ref 11.5–15.5)
WBC: 7.2 10*3/uL (ref 4.0–10.5)
nRBC: 0 % (ref 0.0–0.2)

## 2019-06-25 LAB — SARS CORONAVIRUS 2 (TAT 6-24 HRS): SARS Coronavirus 2: NEGATIVE

## 2019-06-25 LAB — TSH: TSH: 8.457 u[IU]/mL — ABNORMAL HIGH (ref 0.350–4.500)

## 2019-06-25 MED ORDER — ALBUTEROL SULFATE HFA 108 (90 BASE) MCG/ACT IN AERS: 2.0000 | INHALATION_SPRAY | Freq: Four times a day (QID) | RESPIRATORY_TRACT | Status: DC | PRN

## 2019-06-25 MED ORDER — ENOXAPARIN SODIUM 40 MG/0.4ML ~~LOC~~ SOLN
40.0000 mg | SUBCUTANEOUS | Status: DC
  Administered 2019-06-25: 40 mg via SUBCUTANEOUS
  Filled 2019-06-25: qty 0.4

## 2019-06-25 MED ORDER — UMECLIDINIUM-VILANTEROL 62.5-25 MCG/INH IN AEPB
1.0000 | INHALATION_SPRAY | Freq: Every day | RESPIRATORY_TRACT | Status: DC
  Administered 2019-06-25 – 2019-06-26 (×2): 1 via RESPIRATORY_TRACT
  Filled 2019-06-25: qty 14

## 2019-06-25 MED ORDER — ALBUTEROL SULFATE (2.5 MG/3ML) 0.083% IN NEBU: 2.5000 mg | INHALATION_SOLUTION | Freq: Four times a day (QID) | RESPIRATORY_TRACT | Status: DC | PRN

## 2019-06-25 MED ORDER — PREDNISONE 20 MG PO TABS
40.0000 mg | ORAL_TABLET | Freq: Every day | ORAL | Status: DC
  Administered 2019-06-26: 40 mg via ORAL
  Filled 2019-06-25: qty 2

## 2019-06-25 NOTE — Telephone Encounter (Signed)
Patient's son, [name] called. He would like if [name] would call him back. He would like to speak to [name] about the release of his mother from the hospital. His number is [phone].

## 2019-06-25 NOTE — Telephone Encounter (Signed)
Noted  

## 2019-06-25 NOTE — Progress Notes (Addendum)
Progress Note    Jane Davis  OJJ:[id number] DOB: [birth year]  DOA: 06/24/2019 PCP: Leone Haven, MD      Brief Narrative:    Medical records reviewed and are as summarized below:  Jane Davis is an 86 y.o. female with medical history significant for COPD, hypothyroidism, hypertension and prediabetes who presents with concerns of bilateral lower extremity pain.      Assessment/Plan:   Principal Problem:   Acute respiratory failure with hypoxia (HCC) Active Problems:   Hypothyroidism   Essential hypertension   Prediabetes   Mass of lingula of lung  Acute hypoxic respiratory failure possibly secondary to new left lung mass vs COPD exacerbation Treat with prednisone and bronchodilators.  Continue oxygen via nasal cannula.  Patient may need home oxygen for chronic hypoxemic respiratory failure.  She is refusing home oxygen at this time.  Obtain baseline ABG.  Consulted pulmonologist, Dr. Patsey Berthold, for evaluation of left lung mass.  She recommended outpatient follow-up for bronchoscopy and PFTs.  Bilateral lower extremity pain/neuropathy Right hip x-ray was unremarkable.  Patient likely has peripheral neuropathy.  Recommended gabapentin or other medications for neuropathy but she refused to take any medicines for this.  Venous duplex has been ordered to rule out DVT.  Prediabetes Hemoglobin A1c is 5.8 consistent with prediabetes.  Low sugar diet recommended.  Hypertension Continue ACE-I  Hypothyroidism Continue levothyroxine    Body mass index is 21.29 kg/m.   Family Communication/Anticipated D/C date and plan/Code Status   DVT prophylaxis: Lovenox Code Status: Full code Family Communication: Plan discussed with patient Disposition Plan: Patient is from home.  Plan for discharge to home tomorrow if hypoxia improves      Subjective:   C/o pain in b/l legs R>L. No SOB, cough, chest pain  Objective:    Vitals:   06/25/19 1227 06/25/19 1400 06/25/19 1404 06/25/19 1405  BP: 121/64     Pulse: 82     Resp: 16     Temp: 98.9 F (37.2 C)     TempSrc:      SpO2: 94% 95% (!) 84% 90%  Weight:      Height:        Intake/Output Summary (Last 24 hours) at 06/25/2019 1557 Last data filed at 06/25/2019 1300 Gross per 24 hour  Intake 360 ml  Output --  Net 360 ml   Filed Weights   06/24/19 1725  Weight: 49.4 kg    Exam:  GEN: NAD SKIN: No rash EYES: EOMI ENT: MMM CV: RRR PULM: CTA B ABD: soft, ND, NT, +BS CNS: AAO x 3, non focal EXT: No edema or tenderness   Data Reviewed:   I have personally reviewed following labs and imaging studies:  Labs: Labs show the following:   Basic Metabolic Panel: Recent Labs  Lab 06/24/19 1723 06/25/19 0422  NA 139 138  K 4.5 4.0  CL 99 99  CO2 33* 33*  GLUCOSE 114* 91  BUN 11 12  CREATININE 0.45 0.48  CALCIUM 9.5 9.0   GFR Estimated Creatinine Clearance: 37.6 mL/min (by C-G formula based on SCr of 0.48 mg/dL). Liver Function Tests: Recent Labs  Lab 06/24/19 1723  AST 23  ALT 13  ALKPHOS 83  BILITOT 0.6  PROT 7.8  ALBUMIN 4.4   No results for input(s): LIPASE, AMYLASE in the last 168 hours. No results for input(s): AMMONIA in the last 168 hours. Coagulation profile No results for input(s): INR, PROTIME in the last  168 hours.  CBC: Recent Labs  Lab 06/24/19 1723 06/25/19 0422  WBC 10.6* 7.2  NEUTROABS 9.1*  --   HGB 14.2 12.6  HCT 43.4 39.0  MCV 94.6 94.9  PLT 277 234   Cardiac Enzymes: No results for input(s): CKTOTAL, CKMB, CKMBINDEX, TROPONINI in the last 168 hours. BNP (last 3 results) No results for input(s): PROBNP in the last 8760 hours. CBG: No results for input(s): GLUCAP in the last 168 hours. D-Dimer: No results for input(s): DDIMER in the last 72 hours. Hgb A1c: Recent Labs    06/25/19 0422  HGBA1C 5.8*   Lipid Profile: No results for input(s): CHOL, HDL, LDLCALC, TRIG, CHOLHDL, LDLDIRECT in the  last 72 hours. Thyroid function studies: Recent Labs    06/25/19 0422  TSH 8.457*   Anemia work up: No results for input(s): VITAMINB12, FOLATE, FERRITIN, TIBC, IRON, RETICCTPCT in the last 72 hours. Sepsis Labs: Recent Labs  Lab 06/24/19 1723 06/25/19 0422  WBC 10.6* 7.2    Microbiology Recent Results (from the past 240 hour(s))  SARS CORONAVIRUS 2 (TAT 6-24 HRS) Nasopharyngeal Nasopharyngeal Swab     Status: None   Collection Time: 06/24/19  8:27 PM   Specimen: Nasopharyngeal Swab  Result Value Ref Range Status   SARS Coronavirus 2 NEGATIVE NEGATIVE Final    Comment: (NOTE) SARS-CoV-2 target nucleic acids are NOT DETECTED. The SARS-CoV-2 RNA is generally detectable in upper and lower respiratory specimens during the acute phase of infection. Negative results do not preclude SARS-CoV-2 infection, do not rule out co-infections with other pathogens, and should not be used as the sole basis for treatment or other patient management decisions. Negative results must be combined with clinical observations, patient history, and epidemiological information. The expected result is Negative. Fact Sheet for Patients: SugarRoll.be Fact Sheet for Healthcare Providers: https://www.woods-mathews.com/ This test is not yet approved or cleared by the [place] FDA and  has been authorized for detection and/or diagnosis of SARS-CoV-2 by FDA under an Emergency Use Authorization (EUA). This EUA will remain  in effect (meaning this test can be used) for the duration of the COVID-19 declaration under Section 56 4(b)(1) of the Act, 21 U.S.C. section 360bbb-3(b)(1), unless the authorization is terminated or revoked sooner. Performed at [facility], [place] [address] [place], [place] [address]     Procedures and diagnostic studies:  CT Angio Chest PE W and/or Wo Contrast  Result Date: 06/24/2019 CLINICAL DATA:  Hypoxia. Bilateral lower  extremity pain. Nodule on chest radiography. EXAM: CT ANGIOGRAPHY CHEST WITH CONTRAST TECHNIQUE: Multidetector CT imaging of the chest was performed using the standard protocol during bolus administration of intravenous contrast. Multiplanar CT image reconstructions and MIPs were obtained to evaluate the vascular anatomy. CONTRAST:  75mL OMNIPAQUE IOHEXOL 350 MG/ML SOLN COMPARISON:  Chest radiography same day and 05/17/2018 FINDINGS: Cardiovascular: There are no pulmonary emboli. Heart size is normal. There is coronary artery calcification. There is aortic atherosclerosis. Mediastinum/Nodes: There is left hilar lymphadenopathy with the largest node measuring 19 mm in diameter. Lungs/Pleura: There is pleural and parenchymal scarring, most pronounced at the lung apices. 4.0 x 2.3 x 1.9 cm mass in the lingula worrisome for lung carcinoma. Left hilar lymphadenopathy as described above similarly worrisome. Upper Abdomen: Negative Musculoskeletal: Ordinary thoracic degenerative changes. Review of the MIP images confirms the above findings. IMPRESSION: No pulmonary emboli. 4.0 x 2.3 x 1.9 cm mass in the lingula worrisome for lung carcinoma. Left hilar lymphadenopathy. Aortic aneurysm NOS (ICD10-I71.9). Electronically Signed  By: Nelson Chimes M.D.   On: 06/24/2019 19:15   DG Chest Portable 1 View  Result Date: 06/24/2019 CLINICAL DATA:  Hypoxia EXAM: PORTABLE CHEST 1 VIEW COMPARISON:  05/17/2018, 02/07/2016 FINDINGS: Hyperinflation with emphysematous disease and bronchitic changes. 2.5 cm oval opacity in the left perihilar region. Normal cardiomediastinal silhouette with aortic atherosclerosis. No pneumothorax. Apical pleural and parenchymal scarring. IMPRESSION: 1. Emphysematous disease with bronchitic changes. 2. 2.5 cm oval opacity in the left perihilar region, possibly representing a lung nodule. Chest CT is recommended for further evaluation. Electronically Signed   By: Donavan Foil M.D.   On: 06/24/2019 17:56    DG HIP UNILAT WITH PELVIS 2-3 VIEWS RIGHT  Result Date: 06/24/2019 CLINICAL DATA:  Right hip pain.  No known injury. EXAM: DG HIP (WITH OR WITHOUT PELVIS) 2-3V RIGHT COMPARISON:  None. FINDINGS: There is diffuse osteopenia. There is no acute displaced fracture or dislocation. There is some sclerosis visualized in the transcervical region of the proximal right femur. Mild degenerative changes are noted of both hips. IMPRESSION: 1. No definite acute displaced fracture or dislocation. 2. Subtle sclerosis in the transcervical region may represent artifact. However, if there is high clinical suspicion follow-up with MRI is recommended given the presence of diffuse osteopenia. Electronically Signed   By: Constance Holster M.D.   On: 06/24/2019 20:53    Medications:   . aspirin EC  81 mg Oral Daily  . enoxaparin (LOVENOX) injection  40 mg Subcutaneous Q24H  . irbesartan  150 mg Oral Daily  . latanoprost  1 drop Both Eyes QHS  . levothyroxine  75 mcg Oral Daily  . predniSONE  40 mg Oral Q breakfast  . timolol  1 drop Right Eye Daily  . umeclidinium-vilanterol  1 puff Inhalation Daily   Continuous Infusions:   LOS: 1 day   Nyeli Holtmeyer  [facility]     06/25/2019, 3:57 PM

## 2019-06-25 NOTE — Telephone Encounter (Signed)
Documentation received. Scanned to chart.

## 2019-06-25 NOTE — Clinical Social Work Note (Addendum)
[employer] representative aware of need for home oxygen.  [name], [place]  3:52 pm Per [employer] representative, patient refused the oxygen he brought to the room. She reportedly wants a portable tank which he cannot provide here.   [name], [place]

## 2019-06-25 NOTE — Telephone Encounter (Signed)
Gracelyn Nurse would like a call back about pt. He brought in [place] forms

## 2019-06-25 NOTE — Progress Notes (Signed)
06/25/2019 SATURATION QUALIFICATIONS: (This note is used to comply with regulatory documentation for home oxygen)  Patient Saturations on Room Air at Rest = 95%  Patient Saturations on Room Air while Ambulating = 84%  Patient Saturations on 3 Liters of oxygen while Ambulating = 90%  Please briefly explain why patient needs home oxygen:  Patient O2 sat dropped to 84% on room air while ambulating; required 3L/min O2 to return O2 sat to 90%.   Dola Argyle, RN

## 2019-06-25 NOTE — Consult Note (Addendum)
Reason for Consult: Lung mass Referring Physician: Ilona Sorrel Ayiku,MD  Jane Davis is an 86 y.o. female.  HPI: Jane Davis is an 86 year old former smoker who was admitted to [facility] on 24 June 2019.  She was noted to have hypoxia though from that standpoint was quite asymptomatic.  Her main complaint at the time was that she had bilateral leg pain and perceived numbness.  In the process of being evaluated for hypoxia she had a CT angio chest which has demonstrated a lingular mass.  Noted to have mild wheezing on examination.  She does have evidence of emphysema on her CT scan.  Of note previously in around 2017 she required oxygen briefly after a COPD exacerbation.  Present is comfortable sitting up in chair with her nasal cannula off.  She is in no respiratory distress.  She has not had any cough, sputum production or hemoptysis.  She has had occasional wheezing.  She is unsure of weight loss.  She has not had any anorexia.  No orthopnea or paroxysmal nocturnal dyspnea.  Past Medical History:  Diagnosis Date  . COPD (chronic obstructive pulmonary disease) ([place])   . Essential hypertension 03/22/2016  . Glaucoma   . Hypertension   . Hypothyroidism   . Hypoxia 02/11/2016   Overview:  Discharged from hospital on home oxygen.  . Palpitations 03/22/2016  . Prediabetes 02/19/2015  . Rectal lesion 05/29/2016  . Urge incontinence of urine 05/29/2016    Past Surgical History:  Procedure Laterality Date  . ABDOMINAL HYSTERECTOMY    . COLONOSCOPY  2013   [place]     Family History  Problem Relation Age of Onset  . Intracerebral hemorrhage Mother   . Cancer Sister   . Cancer Brother   . Bladder Cancer Neg Hx   . Prostate cancer Neg Hx     Social History:  reports that she quit smoking about 3 years ago. Her smoking use included cigarettes. She has a 40.00 pack-year smoking history. She has never used smokeless tobacco. She reports that she does not drink alcohol or use  drugs.  Allergies: No Known Allergies  Medications:  I have reviewed the patient's current medications. Prior to Admission:  Medications Prior to Admission  Medication Sig Dispense Refill Last Dose  . aspirin EC 81 MG tablet Take 81 mg by mouth daily.   Unknown at Unknown  . latanoprost (XALATAN) 0.005 % ophthalmic solution Place 1 drop into both eyes at bedtime.    06/23/2019 at 2100  . levothyroxine (SYNTHROID) 75 MCG tablet Take 1 tablet (75 mcg total) by mouth daily. 90 tablet 1 06/24/2019 at 0900  . olmesartan (BENICAR) 20 MG tablet Take 1 tablet (20 mg total) by mouth daily. 90 tablet 1 06/24/2019 at 0900  . Timolol Maleate 0.5 % (DAILY) SOLN Place 1 drop into the right eye daily.   06/24/2019 at 0900    Results for orders placed or performed during the hospital encounter of 06/24/19 (from the past 48 hour(s))  Troponin I (High Sensitivity)     Status: Abnormal   Collection Time: 06/24/19  5:23 PM  Result Value Ref Range   Troponin I (High Sensitivity) 24 (H) <18 ng/L    Comment: (NOTE) Elevated high sensitivity troponin I (hsTnI) values and significant  changes across serial measurements may suggest ACS but many other  chronic and acute conditions are known to elevate hsTnI results.  Refer to the "Links" section for chest pain algorithms and additional  guidance. Performed at [facility], [address] [place], [place] [address]   Comprehensive metabolic panel     Status: Abnormal   Collection Time: 06/24/19  5:23 PM  Result Value Ref Range   Sodium 139 135 - 145 mmol/L   Potassium 4.5 3.5 - 5.1 mmol/L   Chloride 99 98 - 111 mmol/L   CO2 33 (H) 22 - 32 mmol/L   Glucose, Bld 114 (H) 70 - 99 mg/dL    Comment: Glucose reference range applies only to samples taken after fasting for at least 8 hours.   BUN 11 8 - 23 mg/dL   Creatinine, Ser 0.45 0.44 - 1.00 mg/dL   Calcium 9.5 8.9 - 10.3 mg/dL   Total Protein 7.8 6.5 - 8.1 g/dL   Albumin 4.4 3.5 - 5.0 g/dL   AST  23 15 - 41 U/L   ALT 13 0 - 44 U/L   Alkaline Phosphatase 83 38 - 126 U/L   Total Bilirubin 0.6 0.3 - 1.2 mg/dL   GFR calc non Af Amer >60 >60 mL/min   GFR calc Af Amer >60 >60 mL/min   Anion gap 7 5 - 15    Comment: Performed at [facility], [address] [place], [place] [address]  CBC with Differential     Status: Abnormal   Collection Time: 06/24/19  5:23 PM  Result Value Ref Range   WBC 10.6 (H) 4.0 - 10.5 K/uL   RBC 4.59 3.87 - 5.11 MIL/uL   Hemoglobin 14.2 12.0 - 15.0 g/dL   HCT 43.4 36.0 - 46.0 %   MCV 94.6 80.0 - 100.0 fL   MCH 30.9 26.0 - 34.0 pg   MCHC 32.7 30.0 - 36.0 g/dL   RDW 11.6 11.5 - 15.5 %   Platelets 277 150 - 400 K/uL   nRBC 0.0 0.0 - 0.2 %   Neutrophils Relative % 85 %   Neutro Abs 9.1 (H) 1.7 - 7.7 K/uL   Lymphocytes Relative 7 %   Lymphs Abs 0.8 0.7 - 4.0 K/uL   Monocytes Relative 6 %   Monocytes Absolute 0.6 0.1 - 1.0 K/uL   Eosinophils Relative 1 %   Eosinophils Absolute 0.1 0.0 - 0.5 K/uL   Basophils Relative 0 %   Basophils Absolute 0.0 0.0 - 0.1 K/uL   Immature Granulocytes 1 %   Abs Immature Granulocytes 0.06 0.00 - 0.07 K/uL    Comment: Performed at [facility], [address] [place], [place] [address]  Brain natriuretic peptide     Status: None   Collection Time: 06/24/19  5:24 PM  Result Value Ref Range   B Natriuretic Peptide 48.0 0.0 - 100.0 pg/mL    Comment: Performed at [facility], [address] [place], [place] [address]  SARS CORONAVIRUS 2 (TAT 6-24 HRS) Nasopharyngeal Nasopharyngeal Swab     Status: None   Collection Time: 06/24/19  8:27 PM   Specimen: Nasopharyngeal Swab  Result Value Ref Range   SARS Coronavirus 2 NEGATIVE NEGATIVE    Comment: (NOTE) SARS-CoV-2 target nucleic acids are NOT DETECTED. The SARS-CoV-2 RNA is generally detectable in upper and lower respiratory specimens during the acute phase of infection. Negative results do not preclude SARS-CoV-2 infection, do not rule  out co-infections with other pathogens, and should not be used as the sole basis for treatment or other patient management decisions. Negative results must be combined with clinical observations, patient history, and epidemiological information. The expected result is Negative. Fact Sheet  for Patients: SugarRoll.be Fact Sheet for Healthcare Providers: https://www.woods-mathews.com/ This test is not yet approved or cleared by the [place] FDA and  has been authorized for detection and/or diagnosis of SARS-CoV-2 by FDA under an Emergency Use Authorization (EUA). This EUA will remain  in effect (meaning this test can be used) for the duration of the COVID-19 declaration under Section 56 4(b)(1) of the Act, 21 U.S.C. section 360bbb-3(b)(1), unless the authorization is terminated or revoked sooner. Performed at [facility], [place] [address], [place], [place] [address]   Troponin I (High Sensitivity)     Status: Abnormal   Collection Time: 06/24/19  8:27 PM  Result Value Ref Range   Troponin I (High Sensitivity) 29 (H) <18 ng/L    Comment: (NOTE) Elevated high sensitivity troponin I (hsTnI) values and significant  changes across serial measurements may suggest ACS but many other  chronic and acute conditions are known to elevate hsTnI results.  Refer to the "Links" section for chest pain algorithms and additional  guidance. Performed at [facility], [address] [place], [place] [address]   Basic metabolic panel     Status: Abnormal   Collection Time: 06/25/19  4:22 AM  Result Value Ref Range   Sodium 138 135 - 145 mmol/L   Potassium 4.0 3.5 - 5.1 mmol/L   Chloride 99 98 - 111 mmol/L   CO2 33 (H) 22 - 32 mmol/L   Glucose, Bld 91 70 - 99 mg/dL    Comment: Glucose reference range applies only to samples taken after fasting for at least 8 hours.   BUN 12 8 - 23 mg/dL   Creatinine, Ser 0.48 0.44 - 1.00 mg/dL   Calcium  9.0 8.9 - 10.3 mg/dL   GFR calc non Af Amer >60 >60 mL/min   GFR calc Af Amer >60 >60 mL/min   Anion gap 6 5 - 15    Comment: Performed at [facility], [address] [place], [place] [address]  CBC     Status: None   Collection Time: 06/25/19  4:22 AM  Result Value Ref Range   WBC 7.2 4.0 - 10.5 K/uL   RBC 4.11 3.87 - 5.11 MIL/uL   Hemoglobin 12.6 12.0 - 15.0 g/dL   HCT 39.0 36.0 - 46.0 %   MCV 94.9 80.0 - 100.0 fL   MCH 30.7 26.0 - 34.0 pg   MCHC 32.3 30.0 - 36.0 g/dL   RDW 11.5 11.5 - 15.5 %   Platelets 234 150 - 400 K/uL   nRBC 0.0 0.0 - 0.2 %    Comment: Performed at [facility], [address] [place], [place] [address]  Hemoglobin A1c     Status: Abnormal   Collection Time: 06/25/19  4:22 AM  Result Value Ref Range   Hgb A1c MFr Bld 5.8 (H) 4.8 - 5.6 %    Comment: (NOTE) Pre diabetes:          5.7%-6.4% Diabetes:              >6.4% Glycemic control for   <7.0% adults with diabetes    Mean Plasma Glucose 119.76 mg/dL    Comment: Performed at [place] [address], [place], [place] [address]  TSH     Status: Abnormal   Collection Time: 06/25/19  4:22 AM  Result Value Ref Range   TSH 8.457 (H) 0.350 - 4.500 uIU/mL    Comment: Performed by a 3rd Generation  assay with a functional sensitivity of <=0.01 uIU/mL. Performed at [facility], [address], [place], [place] [address]     CT Angio Chest PE W and/or Wo Contrast  Result Date: 06/24/2019 CLINICAL DATA:  Hypoxia. Bilateral lower extremity pain. Nodule on chest radiography. EXAM: CT ANGIOGRAPHY CHEST WITH CONTRAST TECHNIQUE: Multidetector CT imaging of the chest was performed using the standard protocol during bolus administration of intravenous contrast. Multiplanar CT image reconstructions and MIPs were obtained to evaluate the vascular anatomy. CONTRAST:  8mL OMNIPAQUE IOHEXOL 350 MG/ML SOLN COMPARISON:  Chest radiography same day and 05/17/2018 FINDINGS:  Cardiovascular: There are no pulmonary emboli. Heart size is normal. There is coronary artery calcification. There is aortic atherosclerosis. Mediastinum/Nodes: There is left hilar lymphadenopathy with the largest node measuring 19 mm in diameter. Lungs/Pleura: There is pleural and parenchymal scarring, most pronounced at the lung apices. 4.0 x 2.3 x 1.9 cm mass in the lingula worrisome for lung carcinoma. Left hilar lymphadenopathy as described above similarly worrisome. Upper Abdomen: Negative Musculoskeletal: Ordinary thoracic degenerative changes. Review of the MIP images confirms the above findings. IMPRESSION: No pulmonary emboli. 4.0 x 2.3 x 1.9 cm mass in the lingula worrisome for lung carcinoma. Left hilar lymphadenopathy. Aortic aneurysm NOS (ICD10-I71.9). Electronically Signed   By: Nelson Chimes M.D.   On: 06/24/2019 19:15   DG Chest Portable 1 View  Result Date: 06/24/2019 CLINICAL DATA:  Hypoxia EXAM: PORTABLE CHEST 1 VIEW COMPARISON:  05/17/2018, 02/07/2016 FINDINGS: Hyperinflation with emphysematous disease and bronchitic changes. 2.5 cm oval opacity in the left perihilar region. Normal cardiomediastinal silhouette with aortic atherosclerosis. No pneumothorax. Apical pleural and parenchymal scarring. IMPRESSION: 1. Emphysematous disease with bronchitic changes. 2. 2.5 cm oval opacity in the left perihilar region, possibly representing a lung nodule. Chest CT is recommended for further evaluation. Electronically Signed   By: Donavan Foil M.D.   On: 06/24/2019 17:56   DG HIP UNILAT WITH PELVIS 2-3 VIEWS RIGHT  Result Date: 06/24/2019 CLINICAL DATA:  Right hip pain.  No known injury. EXAM: DG HIP (WITH OR WITHOUT PELVIS) 2-3V RIGHT COMPARISON:  None. FINDINGS: There is diffuse osteopenia. There is no acute displaced fracture or dislocation. There is some sclerosis visualized in the transcervical region of the proximal right femur. Mild degenerative changes are noted of both hips. IMPRESSION: 1.  No definite acute displaced fracture or dislocation. 2. Subtle sclerosis in the transcervical region may represent artifact. However, if there is high clinical suspicion follow-up with MRI is recommended given the presence of diffuse osteopenia. Electronically Signed   By: Constance Holster M.D.   On: 06/24/2019 20:53    Review of Systems  Constitutional: Negative.   HENT: Negative.   Respiratory: Positive for shortness of breath (chronic).   Cardiovascular: Negative.   Gastrointestinal: Negative.   Genitourinary: Negative.   Musculoskeletal: Positive for arthralgias.  Neurological: Negative.   Psychiatric/Behavioral: Negative.   All other systems reviewed and are negative.  Blood pressure (!) 102/52, pulse 76, temperature 98.1 F (36.7 C), temperature source Oral, resp. rate (!) 24, height 5' (1.524 m), weight 49.4 kg, SpO2 95 %.   SpO2: 95 % O2 Flow Rate (L/min): 2 L/min As noted patient however currently off of oxygen.   Physical Exam GENERAL: Spry elderly female, awake alert fully oriented comfortable without nasal cannula O2. HEAD: Normocephalic, atraumatic.  EYES: Pupils equal, round, reactive to light.  No scleral icterus.  MOUTH: Oral mucosa moist.  No thrush.  Mallampati II. NECK: Supple. No thyromegaly.  No nodules. No JVD.  PULMONARY: Good air entry bilaterally.  Coarse breath sounds, no other adventitious sounds. CARDIOVASCULAR: S1 and S2. Regular rate and rhythm.  No rubs murmurs or gallops heard. GASTROINTESTINAL: Abdomen soft, nondistended, nontender, normoactive bowel sounds. MUSCULOSKELETAL: No joint deformity, no clubbing, no edema.  NEUROLOGIC: No focal deficits noted. Awake alert, speech is fluent and clear.   SKIN: Intact,warm,dry.  No rashes noted. PSYCH: Mood and behavior normal.       Assessment/Plan:  Left lingular mass, carcinoma until proven otherwise  The patient would benefit from bronchoscopy with navigational assistance for diagnosis  She  will need PET/CTonce biopsy is performed  I have discussed the procedure with the patient and her son and POA Mr. Gracelyn Nurse  Benefits, limitations and potential complications of the procedure were discussed with the patient/family  including, but not limited to bleeding, hemoptysis, respiratory failure requiring intubation and/or prolongued mechanical ventilation, infection, pneumothorax (collapse of lung) requiring chest tube placement, stroke from air embolism or even death  Procedure has been scheduled for 19 March at 1 PM it will be bronchoscopy with navigational assistance under general anesthesia  Procedure can be done as an outpatient if the patient meets criteria for discharge today  She is not on anticoagulants, recommend also to hold NSAIDs and aspirin prior to procedure   Leg pain with perceived numbness  Query paraneoplastic syndrome Subacute sensory neuropathy versus hypertrophic pulmonary osteoarthropathy  COPD with mild acute exacerbation Acute respiratory failure with hypoxia, resolving She will need PFTs as an outpatient Continue followed with pulmonary Recommend Anoro (LABA/LAMA) 1 puff daily As needed albuterol We will see the patient in follow-up in 3 to 4 weeks time after discharge  Thank you for allowing [facility] to participate in this patient's care. I updated the patient's son and POA Mr. Gracelyn Nurse via telephone.   Renold Don, MD [facility] 06/25/2019, 11:25 AM    *This note was dictated using voice recognition software/Dragon.  Despite best efforts to proofread, errors can occur which can change the meaning.  Any change was purely unintentional.

## 2019-06-25 NOTE — Progress Notes (Signed)
   06/25/19 1420  Clinical Encounter Type  Visited With Patient  Visit Type Initial  Referral From Chaplain  Consult/Referral To Chaplain  While doing rounds chaplain visited with patient.When Chaplain entered the room patient was reading and she commented on that. Chaplain also commented on seeing patient walling the hall and patient replied that her leg started hurting. She also mention having something going on in her lungs and that she is having a procedure done tomorrow. Chaplain and patient talked about grandchildren and great-grandchildren.

## 2019-06-25 NOTE — Telephone Encounter (Addendum)
ENB scheduled with Dr Patsey Berthold for 06/27/19 at 1:00 pm  Pt is currently admitted but will soon be d/c  Dx is lung mass  Booking number [id number] I have sent secure email to all parties and have also faxed booking request form to PAT  Will forward to [facility] to be made aware

## 2019-06-25 NOTE — Telephone Encounter (Signed)
Returned call and son reports patient was admitted to hospital 06/24/19 with oxygen sats at 81%. States mass found on lung, to be biopsied. POA information scanned to chart. Will follow as appropriate with TCM transition of care.

## 2019-06-26 LAB — URINALYSIS, COMPLETE (UACMP) WITH MICROSCOPIC
Bacteria, UA: NONE SEEN
Bilirubin Urine: NEGATIVE
Glucose, UA: NEGATIVE mg/dL
Hgb urine dipstick: NEGATIVE
Ketones, ur: NEGATIVE mg/dL
Nitrite: NEGATIVE
Protein, ur: NEGATIVE mg/dL
Specific Gravity, Urine: 1.004 — ABNORMAL LOW (ref 1.005–1.030)
pH: 7 (ref 5.0–8.0)

## 2019-06-26 LAB — BASIC METABOLIC PANEL
Anion gap: 11 (ref 5–15)
BUN: 10 mg/dL (ref 8–23)
CO2: 30 mmol/L (ref 22–32)
Calcium: 9.5 mg/dL (ref 8.9–10.3)
Chloride: 93 mmol/L — ABNORMAL LOW (ref 98–111)
Creatinine, Ser: 0.6 mg/dL (ref 0.44–1.00)
GFR calc Af Amer: >60 mL/min (ref >60)
GFR calc non Af Amer: >60 mL/min (ref >60)
Glucose, Bld: 155 mg/dL — ABNORMAL HIGH (ref 70–99)
Potassium: 4.4 mmol/L (ref 3.5–5.1)
Sodium: 134 mmol/L — ABNORMAL LOW (ref 135–145)

## 2019-06-26 MED ORDER — ALBUTEROL SULFATE HFA 108 (90 BASE) MCG/ACT IN AERS: 2.0000 | INHALATION_SPRAY | Freq: Four times a day (QID) | RESPIRATORY_TRACT | 0 refills | Status: AC | PRN

## 2019-06-26 MED ORDER — UMECLIDINIUM-VILANTEROL 62.5-25 MCG/INH IN AEPB: 1.0000 | INHALATION_SPRAY | Freq: Every day | RESPIRATORY_TRACT | 0 refills | Status: AC

## 2019-06-26 NOTE — Discharge Summary (Signed)
Physician Discharge Summary  Jane Davis SEG:[id number] DOB: Aug 13, 1933 DOA: 06/24/2019  PCP: Leone Haven, MD  Admit date: 06/24/2019 Discharge date: 06/26/2019  Discharge disposition: Home   Recommendations for Outpatient Follow-Up:   Follow-up with pulmonologist for bronchoscopy as scheduled on 06/27/2019 Follow-up with PCP in 1 week   Discharge Diagnosis:   Principal Problem:   Acute respiratory failure with hypoxia [facility]) Active Problems:   Hypothyroidism   Essential hypertension   Prediabetes   Mass of lingula of lung    Discharge Condition: Stable.  Diet recommendation: Low-salt diet  Code status: Full code.    Hospital Course:   Jane Davis is an 86 y.o. female with medical history significant forCOPD, hypothyroidism, hypertension, history of 50-pack-year smoking (quit about 3 years ago) and prediabetes who presented with bilateral lower extremity pain of breath that is worse with ambulation.  In the ED, patient was hypoxemic with oxygen saturation of 70% on room air and required supplemental oxygen.  Patient was treated with prednisone and bronchodilators for suspected COPD exacerbation.  She was also found to have a left lung mass concerning for malignancy.  Dr. Patsey Berthold, pulmonologist, was consulted to assist with management.  She recommended bronchoscopy as an outpatient for further evaluation.  Patient was discharged on bronchodilators and prednisone for her COPD.  Patient has prediabetes with hemoglobin A1c of 5.8.  She was counseled on the importance of positive lifestyle changes.  Bilateral leg pain was suspicious for peripheral neuropathy and gabapentin or other medications for neuropathic pain was recommended but patient refused and decided she did not want to take any more medicines.  Her oxygen saturation at rest was 96% on room air but oxygen saturation dropped to 84% on room air while she was ambulating.  It improved to  92% with 2 L of oxygen.  Home oxygen was recommended at discharge but she refused and said she did not want to use any home oxygen.  Later, she said that she did not like the big oxygen tanks and so she would arrange with her PCP to have a smaller oxygen tank.  Her condition has improved.  She is deemed stable for discharge to home today.     Discharge Exam:   Vitals:   06/26/19 0830 06/26/19 0835  BP:    Pulse:    Resp:    Temp:    SpO2: (!) 84% 96%   Vitals:   06/25/19 2024 06/26/19 0551 06/26/19 0830 06/26/19 0835  BP: 99/60 (!) 145/64    Pulse: 83 79    Resp: 18 18    Temp: 98.3 F (36.8 C) 98.1 F (36.7 C)    TempSrc: Oral Oral    SpO2: 93% 93% (!) 84% 96%  Weight:      Height:         GEN: NAD SKIN: No rash EYES: EOMI ENT: MMM CV: RRR PULM: CTA B ABD: soft, ND, NT, +BS CNS: AAO x 3, non focal EXT: No edema or tenderness   The results of significant diagnostics from this hospitalization (including imaging, microbiology, ancillary and laboratory) are listed below for reference.     Procedures and Diagnostic Studies:   CT Angio Chest PE W and/or Wo Contrast  Result Date: 06/24/2019 CLINICAL DATA:  Hypoxia. Bilateral lower extremity pain. Nodule on chest radiography. EXAM: CT ANGIOGRAPHY CHEST WITH CONTRAST TECHNIQUE: Multidetector CT imaging of the chest was performed using the standard protocol during bolus administration of intravenous contrast. Multiplanar CT image  reconstructions and MIPs were obtained to evaluate the vascular anatomy. CONTRAST:  46mL OMNIPAQUE IOHEXOL 350 MG/ML SOLN COMPARISON:  Chest radiography same day and 05/17/2018 FINDINGS: Cardiovascular: There are no pulmonary emboli. Heart size is normal. There is coronary artery calcification. There is aortic atherosclerosis. Mediastinum/Nodes: There is left hilar lymphadenopathy with the largest node measuring 19 mm in diameter. Lungs/Pleura: There is pleural and parenchymal scarring, most  pronounced at the lung apices. 4.0 x 2.3 x 1.9 cm mass in the lingula worrisome for lung carcinoma. Left hilar lymphadenopathy as described above similarly worrisome. Upper Abdomen: Negative Musculoskeletal: Ordinary thoracic degenerative changes. Review of the MIP images confirms the above findings. IMPRESSION: No pulmonary emboli. 4.0 x 2.3 x 1.9 cm mass in the lingula worrisome for lung carcinoma. Left hilar lymphadenopathy. Aortic aneurysm NOS (ICD10-I71.9). Electronically Signed   By: Nelson Chimes M.D.   On: 06/24/2019 19:15   US Venous Img Lower Bilateral (DVT)  Result Date: 06/25/2019 CLINICAL DATA:  Bilateral lower extremity pain for the past 3 weeks. Evaluate for DVT. EXAM: BILATERAL LOWER EXTREMITY VENOUS DOPPLER ULTRASOUND TECHNIQUE: Gray-scale sonography with graded compression, as well as color Doppler and duplex ultrasound were performed to evaluate the lower extremity deep venous systems from the level of the common femoral vein and including the common femoral, femoral, profunda femoral, popliteal and calf veins including the posterior tibial, peroneal and gastrocnemius veins when visible. The superficial great saphenous vein was also interrogated. Spectral Doppler was utilized to evaluate flow at rest and with distal augmentation maneuvers in the common femoral, femoral and popliteal veins. COMPARISON:  None. FINDINGS: RIGHT LOWER EXTREMITY Common Femoral Vein: No evidence of thrombus. Normal compressibility, respiratory phasicity and response to augmentation. Saphenofemoral Junction: No evidence of thrombus. Normal compressibility and flow on color Doppler imaging. Profunda Femoral Vein: No evidence of thrombus. Normal compressibility and flow on color Doppler imaging. Femoral Vein: No evidence of thrombus. Normal compressibility, respiratory phasicity and response to augmentation. Popliteal Vein: No evidence of thrombus. Normal compressibility, respiratory phasicity and response to  augmentation. Calf Veins: No evidence of thrombus. Normal compressibility and flow on color Doppler imaging. Superficial Great Saphenous Vein: No evidence of thrombus. Normal compressibility. Venous Reflux:  None. Other Findings:  None. LEFT LOWER EXTREMITY Common Femoral Vein: No evidence of thrombus. Normal compressibility, respiratory phasicity and response to augmentation. Saphenofemoral Junction: No evidence of thrombus. Normal compressibility and flow on color Doppler imaging. Profunda Femoral Vein: No evidence of thrombus. Normal compressibility and flow on color Doppler imaging. Femoral Vein: No evidence of thrombus. Normal compressibility, respiratory phasicity and response to augmentation. Popliteal Vein: No evidence of thrombus. Normal compressibility, respiratory phasicity and response to augmentation. Calf Veins: No evidence of thrombus. Normal compressibility and flow on color Doppler imaging. Superficial Great Saphenous Vein: No evidence of thrombus. Normal compressibility. Venous Reflux:  None. Other Findings:  None. IMPRESSION: No evidence of DVT within either lower extremity. Electronically Signed   By: Sandi Mariscal M.D.   On: 06/25/2019 16:48   DG Chest Portable 1 View  Result Date: 06/24/2019 CLINICAL DATA:  Hypoxia EXAM: PORTABLE CHEST 1 VIEW COMPARISON:  05/17/2018, 02/07/2016 FINDINGS: Hyperinflation with emphysematous disease and bronchitic changes. 2.5 cm oval opacity in the left perihilar region. Normal cardiomediastinal silhouette with aortic atherosclerosis. No pneumothorax. Apical pleural and parenchymal scarring. IMPRESSION: 1. Emphysematous disease with bronchitic changes. 2. 2.5 cm oval opacity in the left perihilar region, possibly representing a lung nodule. Chest CT is recommended for further evaluation. Electronically Signed  By: Donavan Foil M.D.   On: 06/24/2019 17:56   DG HIP UNILAT WITH PELVIS 2-3 VIEWS RIGHT  Result Date: 06/24/2019 CLINICAL DATA:  Right hip pain.   No known injury. EXAM: DG HIP (WITH OR WITHOUT PELVIS) 2-3V RIGHT COMPARISON:  None. FINDINGS: There is diffuse osteopenia. There is no acute displaced fracture or dislocation. There is some sclerosis visualized in the transcervical region of the proximal right femur. Mild degenerative changes are noted of both hips. IMPRESSION: 1. No definite acute displaced fracture or dislocation. 2. Subtle sclerosis in the transcervical region may represent artifact. However, if there is high clinical suspicion follow-up with MRI is recommended given the presence of diffuse osteopenia. Electronically Signed   By: Constance Holster M.D.   On: 06/24/2019 20:53     Labs:   Basic Metabolic Panel: Recent Labs  Lab 06/24/19 1723 06/24/19 1723 06/25/19 0422 06/26/19 0859  NA 139  --  138 134*  K 4.5   < > 4.0 4.4  CL 99  --  99 93*  CO2 33*  --  33* 30  GLUCOSE 114*  --  91 155*  BUN 11  --  12 10  CREATININE 0.45  --  0.48 0.60  CALCIUM 9.5  --  9.0 9.5   < > = values in this interval not displayed.   GFR Estimated Creatinine Clearance: 37.6 mL/min (by C-G formula based on SCr of 0.6 mg/dL). Liver Function Tests: Recent Labs  Lab 06/24/19 1723  AST 23  ALT 13  ALKPHOS 83  BILITOT 0.6  PROT 7.8  ALBUMIN 4.4   No results for input(s): LIPASE, AMYLASE in the last 168 hours. No results for input(s): AMMONIA in the last 168 hours. Coagulation profile No results for input(s): INR, PROTIME in the last 168 hours.  CBC: Recent Labs  Lab 06/24/19 1723 06/25/19 0422  WBC 10.6* 7.2  NEUTROABS 9.1*  --   HGB 14.2 12.6  HCT 43.4 39.0  MCV 94.6 94.9  PLT 277 234   Cardiac Enzymes: No results for input(s): CKTOTAL, CKMB, CKMBINDEX, TROPONINI in the last 168 hours. BNP: Invalid input(s): POCBNP CBG: No results for input(s): GLUCAP in the last 168 hours. D-Dimer No results for input(s): DDIMER in the last 72 hours. Hgb A1c Recent Labs    06/25/19 0422  HGBA1C 5.8*   Lipid Profile No  results for input(s): CHOL, HDL, LDLCALC, TRIG, CHOLHDL, LDLDIRECT in the last 72 hours. Thyroid function studies Recent Labs    06/25/19 0422  TSH 8.457*   Anemia work up No results for input(s): VITAMINB12, FOLATE, FERRITIN, TIBC, IRON, RETICCTPCT in the last 72 hours. Microbiology Recent Results (from the past 240 hour(s))  SARS CORONAVIRUS 2 (TAT 6-24 HRS) Nasopharyngeal Nasopharyngeal Swab     Status: None   Collection Time: 06/24/19  8:27 PM   Specimen: Nasopharyngeal Swab  Result Value Ref Range Status   SARS Coronavirus 2 NEGATIVE NEGATIVE Final    Comment: (NOTE) SARS-CoV-2 target nucleic acids are NOT DETECTED. The SARS-CoV-2 RNA is generally detectable in upper and lower respiratory specimens during the acute phase of infection. Negative results do not preclude SARS-CoV-2 infection, do not rule out co-infections with other pathogens, and should not be used as the sole basis for treatment or other patient management decisions. Negative results must be combined with clinical observations, patient history, and epidemiological information. The expected result is Negative. Fact Sheet for Patients: SugarRoll.be Fact Sheet for Healthcare Providers: https://www.woods-mathews.com/ This test is not  yet approved or cleared by the [place] and  has been authorized for detection and/or diagnosis of SARS-CoV-2 by FDA under an Emergency Use Authorization (EUA). This EUA will remain  in effect (meaning this test can be used) for the duration of the COVID-19 declaration under Section 56 4(b)(1) of the Act, 21 U.S.C. section 360bbb-3(b)(1), unless the authorization is terminated or revoked sooner. Performed at [facility], [place] [address], [place], [place] [address]      Discharge Instructions:   Discharge Instructions    Diet - low sodium heart healthy   Complete by: As directed    For home use only DME oxygen   Complete  by: As directed    Length of Need: Lifetime   Mode or (Route): Nasal cannula   Liters per Minute: 3   Frequency: Continuous (stationary and portable oxygen unit needed)   Oxygen conserving device: Yes   Oxygen delivery system: Gas   Increase activity slowly   Complete by: As directed      Allergies as of 06/26/2019   No Known Allergies     Medication List    TAKE these medications   albuterol 108 (90 Base) MCG/ACT inhaler Commonly known as: VENTOLIN HFA Inhale 2 puffs into the lungs every 6 (six) hours as needed for wheezing or shortness of breath.   aspirin EC 81 MG tablet Take 81 mg by mouth daily.   latanoprost 0.005 % ophthalmic solution Commonly known as: XALATAN Place 1 drop into both eyes at bedtime.   levothyroxine 75 MCG tablet Commonly known as: Synthroid Take 1 tablet (75 mcg total) by mouth daily.   olmesartan 20 MG tablet Commonly known as: BENICAR Take 1 tablet (20 mg total) by mouth daily.   Timolol Maleate 0.5 % (DAILY) Soln Place 1 drop into the right eye daily.   umeclidinium-vilanterol 62.5-25 MCG/INH Aepb Commonly known as: ANORO ELLIPTA Inhale 1 puff into the lungs daily. Start taking on: June 27, 2019            Durable Medical Equipment  (From admission, onward)         Start     Ordered   06/25/19 0000  For home use only DME oxygen    Question Answer Comment  Length of Need Lifetime   Mode or (Route) Nasal cannula   Liters per Minute 3   Frequency Continuous (stationary and portable oxygen unit needed)   Oxygen conserving device Yes   Oxygen delivery system Gas      06/25/19 1515         Follow-up Information    Leone Haven, MD. Go on 06/30/2019.   Specialty: Family Medicine Why: 10am. (Video / Telephone appointment) Contact information: [address] [place] [address] [phone]            Time coordinating discharge: 28 minutes  Signed:  [facility] 06/26/2019, 6:32 PM

## 2019-06-26 NOTE — Telephone Encounter (Signed)
Noted thank you

## 2019-06-26 NOTE — Progress Notes (Signed)
SATURATION QUALIFICATIONS: (This note is used to comply with regulatory documentation for home oxygen)  Patient Saturations on Room Air at Rest = 96%  Patient Saturations on Room Air while Ambulating = 84%  Patient Saturations on 2Liters of oxygen while Ambulating = 92%  Please briefly explain why patient needs home oxygen: oxygen levels drop when the patient is ambulating

## 2019-06-26 NOTE — Clinical Social Work Note (Signed)
Per RN, patient still does not want the oxygen that was offered yesterday. Patient has orders to discharge home today. No further concerns.   CSW signing off.  [name], [place]

## 2019-06-26 NOTE — Telephone Encounter (Signed)
Patient is being discharged today.  She should not need any ancillary testing as she has had recent Covid test and has been hospitalized and has had blood work.  She was instructed of quarantining when she gets home until after her procedure.

## 2019-06-26 NOTE — Telephone Encounter (Signed)
06/26/2019 at 11:29 am EST spoke with Elberta Fortis at [facility] [phone]. PA is not required for procedure code 31627. Call Ref # [id number]. Rhonda J Cobb

## 2019-06-27 ENCOUNTER — Ambulatory Visit: Admit: 2019-06-27 | Payer: Medicare HMO | Admitting: Pulmonary Disease

## 2019-06-27 ENCOUNTER — Telehealth: Payer: Self-pay | Admitting: Family Medicine

## 2019-06-27 ENCOUNTER — Other Ambulatory Visit: Payer: Self-pay

## 2019-06-27 ENCOUNTER — Telehealth: Payer: Self-pay | Admitting: Pulmonary Disease

## 2019-06-27 ENCOUNTER — Ambulatory Visit
Admission: RE | Admit: 2019-06-27 | Discharge: 2019-06-27 | Disposition: A | Discharge status: Home / Self Care | Payer: Medicare HMO | Attending: Pulmonary Disease | Admitting: Pulmonary Disease

## 2019-06-27 ENCOUNTER — Encounter: Payer: Self-pay | Admitting: Pulmonary Disease

## 2019-06-27 ENCOUNTER — Encounter
Admission: RE | Disposition: A | Discharge status: Home / Self Care | Payer: Self-pay | Source: Home / Self Care | Attending: Pulmonary Disease

## 2019-06-27 ENCOUNTER — Telehealth: Payer: Self-pay

## 2019-06-27 DIAGNOSIS — R918 Other nonspecific abnormal finding of lung field: Secondary | ICD-10-CM

## 2019-06-27 DIAGNOSIS — Z538 Procedure and treatment not carried out for other reasons: Secondary | ICD-10-CM | POA: Diagnosis not present

## 2019-06-27 SURGERY — BRONCHOSCOPY, WITH EBUS
Anesthesia: General

## 2019-06-27 MED ORDER — LACTATED RINGERS IV SOLN: INTRAVENOUS | Status: DC

## 2019-06-27 MED ORDER — BUTAMBEN-TETRACAINE-BENZOCAINE 2-2-14 % EX AERO
1.0000 | INHALATION_SPRAY | Freq: Once | CUTANEOUS | Status: DC
  Filled 2019-06-27: qty 20

## 2019-06-27 NOTE — Telephone Encounter (Signed)
Reviewed

## 2019-06-27 NOTE — Telephone Encounter (Signed)
Pt's son would like a call back to talk with you about pt's upcoming appointment with Dr. Caryl Bis, her hospitalization, and few other things. He asked for the wellness nurse.

## 2019-06-27 NOTE — Telephone Encounter (Signed)
Dr. Patsey Berthold please advise on this. This was the patient that did not fast for the procedure.

## 2019-06-27 NOTE — Telephone Encounter (Signed)
Called and spoke to pt's son and POA and advised that we are waiting on [employer] to approval PET scan and MRI.  Authorization has been started and is pending approval.  Advised that I would contact him once I hear back from [facility] to schedule. Rhonda J Cobb

## 2019-06-27 NOTE — Telephone Encounter (Signed)
Yes this is the patient that we order the PET/CT and MRI on.  Unfortunately I will be out next week from Thursday on and bronchoscopy slots are failed.  I advised the patient's son that we would reschedule it on the first part of April.  I have already spoken to the son about this.

## 2019-06-27 NOTE — Telephone Encounter (Signed)
Noted. Will follow with TCM.

## 2019-06-27 NOTE — Telephone Encounter (Signed)
Transition Care Management Follow-up Telephone Call  Date of discharge and from where: 06/26/19 from [facility]  How have you been since you were released from the hospital? Information received from patient and son, Jeneen Rinks, HIPAA compliant. Patient is tired but doing okay. Denies shortness of breath, N/V/D, leg weakness, pain and all other symptoms. Nebulizer in use prn. Oxygen not in use. Monitoring oxygen saturation at 96% resting. Now shuffles and drags feet when walking.   Any questions or concerns? Requests home oxygen concentrator. Per discharge note her oxygen drops when ambulating. Requests handicap placard form completed for easier walking from car to medical appointments. Requests Home Health assistance. Handicap placard request forms in office with cma.   Items Reviewed:  Did the pt receive and understand the discharge instructions provided? Yes  Medications obtained and verified? Yes  Any new allergies since your discharge? No  Dietary orders reviewed? Yes, low salt diet  Do you have support at home? Yes, son is with her 24 hours   Functional Questionnaire: (I = Independent and D = Dependent) ADLs: Son assists as needed.   Bathing/Dressing- I- reluctant to shower/bathe self. Shower seat with walk in shower in use.   Meal Prep- Prepares breakfast. Appetite decreased. Poor choices with other meals/snacks.   Eating- I  Maintaining continence- I  Transferring/Ambulation- No equipment in use, no falls.  Managing Meds- I  Follow up appointments reviewed:   PCP Hospital f/u appt confirmed?  Scheduled to see Dr. Caryl Bis on 06/30/19 @ 10:00 via virtual [phone].  Pulmonary f/u appt confirmed?  Bronchoscopy canceled due to patient eating sooner than allowed. To be rescheduled.   Are transportation arrangements needed? No  If their condition worsens, is the pt aware to call PCP or go to the Emergency Dept.? Yes  Was the patient provided with contact information for the  PCP's office or ED? Yes  Was to pt encouraged to call back with questions or concerns? Yes

## 2019-06-27 NOTE — Telephone Encounter (Signed)
I spoke with Suanne Marker and she states that she's in the process of getting auth. I will forward this to her for a follow up.

## 2019-06-27 NOTE — Progress Notes (Signed)
Patients surgery cancelled due to her eating breakfast, per Dr Randa Lynn, DR Patsey Berthold made aware son also made aware, IV dced and pt escorted to her son in waiting room

## 2019-06-30 ENCOUNTER — Telehealth (INDEPENDENT_AMBULATORY_CARE_PROVIDER_SITE_OTHER): Payer: Medicare HMO | Admitting: Family Medicine

## 2019-06-30 ENCOUNTER — Other Ambulatory Visit: Payer: Self-pay

## 2019-06-30 DIAGNOSIS — J449 Chronic obstructive pulmonary disease, unspecified: Secondary | ICD-10-CM | POA: Insufficient documentation

## 2019-06-30 DIAGNOSIS — R32 Unspecified urinary incontinence: Secondary | ICD-10-CM

## 2019-06-30 DIAGNOSIS — R918 Other nonspecific abnormal finding of lung field: Secondary | ICD-10-CM

## 2019-06-30 DIAGNOSIS — J9601 Acute respiratory failure with hypoxia: Secondary | ICD-10-CM | POA: Diagnosis not present

## 2019-06-30 DIAGNOSIS — R7303 Prediabetes: Secondary | ICD-10-CM | POA: Diagnosis not present

## 2019-06-30 MED ORDER — HYDROCODONE-ACETAMINOPHEN 5-325 MG PO TABS: 1.0000 | ORAL_TABLET | Freq: Four times a day (QID) | ORAL | 0 refills | Status: DC | PRN

## 2019-06-30 NOTE — Progress Notes (Signed)
Virtual Visit via video Note  This visit type was conducted due to national recommendations for restrictions regarding the COVID-19 pandemic (e.g. social distancing).  This format is felt to be most appropriate for this patient at this time.  All issues noted in this document were discussed and addressed.  No physical exam was performed (except for noted visual exam findings with Video Visits).   I connected with Jane Davis today at 10:00 AM EDT by a video enabled telemedicine application and verified that I am speaking with the correct person using two identifiers. Location patient: home Location provider: work Persons participating in the virtual visit: patient, provider, Gracelyn Nurse (son)  I discussed the limitations, risks, security and privacy concerns of performing an evaluation and management service by telephone and the availability of in person appointments. I also discussed with the patient that there may be a patient responsible charge related to this service. The patient expressed understanding and agreed to proceed.  Reason for visit: follow-up  HPI: The patient was hospitalized from 06/24/2019-06/26/2019.  She was hospitalized for dyspnea and treated for a COPD exacerbation.  She was also found to have a lingula mass.  She was treated with prednisone and bronchodilators and progressively improved.  She was recommended to have ambulatory oxygen though she declined in the hospital.  She was supposed to have a bronchoscopy with biopsy after discharge though her son notes that he did not receive any preprocedure instructions and it had to be rescheduled.  She is scheduled for a PET scan and MRI brain to evaluate for metastatic disease.  The patient's son notes she has been having pretty significant bone pain in one of her femurs and notes pretty significant discomfort.  They are requesting pain medication for this.  She has had a shuffling gait.  She has also had a sharp cognitive  decline recently and has issues with problem solving and memory.  Her son is additionally reporting issues with incontinence of urine and bowels.  He also reports that she has had coffee ground hemoptysis that has probably been going on for a couple of months though he is not sure of the exact timeline because she had not reported it to him until recently.  She has been on Anoro since discharge.  She notes her breathing has been up and down if she is moving.  At rest she does okay.  No cough or wheezing.  No fevers.  A1c was 5.8 in the hospital.  Discharge summary reviewed.  Medications reviewed.   ROS: See pertinent positives and negatives per HPI.  Past Medical History:  Diagnosis Date  . COPD (chronic obstructive pulmonary disease) ([place])   . Essential hypertension 03/22/2016  . Glaucoma   . Hypertension   . Hypothyroidism   . Hypoxia 02/11/2016   Overview:  Discharged from hospital on home oxygen.  . Palpitations 03/22/2016  . Prediabetes 02/19/2015  . Rectal lesion 05/29/2016  . Urge incontinence of urine 05/29/2016    Past Surgical History:  Procedure Laterality Date  . ABDOMINAL HYSTERECTOMY    . COLONOSCOPY  2013   [place]     Family History  Problem Relation Age of Onset  . Intracerebral hemorrhage Mother   . Cancer Sister   . Cancer Brother   . Bladder Cancer Neg Hx   . Prostate cancer Neg Hx     SOCIAL HX: former smoker   Current Outpatient Medications:  .  albuterol (VENTOLIN HFA) 108 (90 Base) MCG/ACT inhaler, Inhale  2 puffs into the lungs every 6 (six) hours as needed for wheezing or shortness of breath., Disp: 8 g, Rfl: 0 .  aspirin EC 81 MG tablet, Take 81 mg by mouth daily., Disp: , Rfl:  .  HYDROcodone-acetaminophen (NORCO/VICODIN) 5-325 MG tablet, Take 1 tablet by mouth every 6 (six) hours as needed for severe pain., Disp: 15 tablet, Rfl: 0 .  latanoprost (XALATAN) 0.005 % ophthalmic solution, Place 1 drop into both eyes at bedtime. , Disp: , Rfl:  .   levothyroxine (SYNTHROID) 75 MCG tablet, Take 1 tablet (75 mcg total) by mouth daily., Disp: 90 tablet, Rfl: 1 .  olmesartan (BENICAR) 20 MG tablet, Take 1 tablet (20 mg total) by mouth daily., Disp: 90 tablet, Rfl: 1 .  Timolol Maleate 0.5 % (DAILY) SOLN, Place 1 drop into the right eye daily., Disp: , Rfl:  .  umeclidinium-vilanterol (ANORO ELLIPTA) 62.5-25 MCG/INH AEPB, Inhale 1 puff into the lungs daily., Disp: 30 each, Rfl: 0  EXAM:  VITALS per patient if applicable: BP 845/36, pulse 81, respiratory rate 18, oxygen saturation 96% on room air  GENERAL: alert, oriented, appears well and in no acute distress  HEENT: atraumatic, conjunttiva clear, no obvious abnormalities on inspection of external nose and ears  NECK: normal movements of the head and neck  LUNGS: on inspection no signs of respiratory distress, breathing rate appears normal, no obvious gross SOB, gasping or wheezing  CV: no obvious cyanosis  MS: moves all visible extremities without noticeable abnormality  PSYCH/NEURO: pleasant and cooperative, no obvious depression or anxiety, speech and thought processing grossly intact  ASSESSMENT AND PLAN:  Discussed the following assessment and plan:  Mass of lingula of lung Suspicious for cancerous process.  She also has history that is concerning for metastatic disease with femur pain as well as sharp cognitive decline concerning for brain metastases and bone metastases.  The patient's son has POA and notes that given the patient's cognitive decline she does not remember the conversation that the pulmonologist had with the patient.  The patient's son's preference at this time would be comfort and pain management and then completing work-up with imaging prior to revisiting the potential diagnoses with the patient prior to the patient's bronchoscopy.  Though complete evaluation through pulmonology.  We will treat her bone pain as likely metastatic disease and treat with hydrocodone.   I discussed the risk of drowsiness and respiratory depression and advised that if those things occurred the patient should be evaluated.  Acute respiratory failure with hypoxia [facility]) Patient met criteria for the oxygen in the hospital.  We will attempt to get her set up with this as an outpatient.  They are requesting portable oxygen tanks.  They do not want a giant oxygen concentrator.  COPD (chronic obstructive pulmonary disease) ([place]) Has improved to a certain degree with treatment for COPD exacerbation.  She will continue her Anoro.  Prediabetes Discussed her recent A1c.  At this time we are not going to plan any interventions while they worked up her lung mass.  Incontinence Her son notes urinary and bowel incontinence.  This could be related to her cognitive decline or some other cause.  We will get home health involved.  They will complete the work-up for the lung mass and see if the PET scan reveals any concerning lesions that could be contributing.   Orders Placed This Encounter  Procedures  . Ambulatory referral to Home Health    Referral Priority:   Routine  Referral Type:   Home Health Care    Referral Reason:   Specialty Services Required    Requested Specialty:   [place]    Number of Visits Requested:   1    Meds ordered this encounter  Medications  . HYDROcodone-acetaminophen (NORCO/VICODIN) 5-325 MG tablet    Sig: Take 1 tablet by mouth every 6 (six) hours as needed for severe pain.    Dispense:  15 tablet    Refill:  0     I discussed the assessment and treatment plan with the patient. The patient was provided an opportunity to ask questions and all were answered. The patient agreed with the plan and demonstrated an understanding of the instructions.   The patient was advised to call back or seek an in-person evaluation if the symptoms worsen or if the condition fails to improve as anticipated.   Tommi Rumps, MD

## 2019-06-30 NOTE — Telephone Encounter (Signed)
F/U appointment scheduled with Dr. Patsey Berthold for 07/17/2019 at 9:30. Son is aware of appointment. Nothing else needed at this time. Rhonda J Cobb

## 2019-06-30 NOTE — Progress Notes (Signed)
Completed form for 02 given to CMA all need is your signature and note and fax to [place].

## 2019-06-30 NOTE — Telephone Encounter (Signed)
Son returned call and was advised for PET scan appt on 07/03/2019 at 8:00 arrival time at [facility]. Nothing to eat or drink after midnight, no gum, sugar, candy or carbs 24 hours before. MRI Brain scheduled for 07/11/2019 at 8:30 arrival at [facility]. No prep. Son is aware of both appointments Jane Davis

## 2019-06-30 NOTE — Telephone Encounter (Signed)
LMOVM for pt's son Mr. Donnal Debar to return my call to schedule PET and MRI. Rhonda J Cobb

## 2019-07-01 ENCOUNTER — Encounter: Payer: Self-pay | Admitting: *Deleted

## 2019-07-01 ENCOUNTER — Telehealth: Payer: Self-pay | Admitting: Family Medicine

## 2019-07-01 DIAGNOSIS — R32 Unspecified urinary incontinence: Secondary | ICD-10-CM | POA: Insufficient documentation

## 2019-07-01 NOTE — Assessment & Plan Note (Signed)
Discussed her recent A1c.  At this time we are not going to plan any interventions while they worked up her lung mass.

## 2019-07-01 NOTE — Addendum Note (Signed)
Addended by: Nanci Pina on: 07/01/2019 11:03 AM   Modules accepted: Orders

## 2019-07-01 NOTE — Telephone Encounter (Signed)
I sent the pain medication to her pharmacy yesterday. Please create a handicap placard form for her and I can sign when I am in the office tomorrow. I am placing the order for home health now. Please also let him know that we are going to fax the oxygen order form today.

## 2019-07-01 NOTE — Telephone Encounter (Signed)
Pt's son is wanting a call back by either [name] or Dr. Ellen Henri nurse about the handicap placard, increasing pain medication, and home health aid. He said he is following up on this.

## 2019-07-01 NOTE — Telephone Encounter (Signed)
Pt son [name] called to check to see if HYDROcodone-acetaminophen (NORCO/VICODIN) 5-325 MG tablet could be increased. Medication is wearing off to quick

## 2019-07-01 NOTE — Telephone Encounter (Signed)
Pt's son is wanting a call back by either [name] or Dr. Ellen Henri nurse about the handicap placard, increasing pain medication, and home health aid. He said he is following up on this.  Chellsea Beckers,cma

## 2019-07-01 NOTE — Assessment & Plan Note (Signed)
Patient met criteria for the oxygen in the hospital.  We will attempt to get her set up with this as an outpatient.  They are requesting portable oxygen tanks.  They do not want a giant oxygen concentrator.

## 2019-07-01 NOTE — Assessment & Plan Note (Addendum)
Suspicious for cancerous process.  She also has history that is concerning for metastatic disease with femur pain as well as sharp cognitive decline concerning for brain metastases and bone metastases.  The patient's son has POA and notes that given the patient's cognitive decline she does not remember the conversation that the pulmonologist had with the patient.  The patient's son's preference at this time would be comfort and pain management and then completing work-up with imaging prior to revisiting the potential diagnoses with the patient prior to the patient's bronchoscopy.  Though complete evaluation through pulmonology.  We will treat her bone pain as likely metastatic disease and treat with hydrocodone.  I discussed the risk of drowsiness and respiratory depression and advised that if those things occurred the patient should be evaluated.

## 2019-07-01 NOTE — Assessment & Plan Note (Signed)
Her son notes urinary and bowel incontinence.  This could be related to her cognitive decline or some other cause.  We will get home health involved.  They will complete the work-up for the lung mass and see if the PET scan reveals any concerning lesions that could be contributing.

## 2019-07-01 NOTE — Telephone Encounter (Signed)
Pt's son "[name]" wants pts pain meds dosage changed because he states that it wears off too quickly. He also requested handicap placard, home health care nurse approval, and oxygen therapy in home. Please advise.

## 2019-07-01 NOTE — Assessment & Plan Note (Signed)
Has improved to a certain degree with treatment for COPD exacerbation.  She will continue her Anoro.

## 2019-07-01 NOTE — Progress Notes (Signed)
  Oncology Nurse Navigator Documentation  Navigator Location: [facility] (07/01/19 1200) Referral Date to RadOnc/MedOnc: 06/27/19 (07/01/19 1200) )Navigator Encounter Type: Introductory Phone Call (07/01/19 1200)   Abnormal Finding Date: 06/24/19 (07/01/19 1200)                   Treatment Phase: Abnormal Scans (07/01/19 1200) Barriers/Navigation Needs: Coordination of Care;Pain (07/01/19 1200)   Interventions: Coordination of Care (07/01/19 1200)   Coordination of Care: Appts (07/01/19 1200)       phone call made to patient to introduce to navigator services and review upcoming appts. Spoke with pt's son, Gracelyn Nurse. All questions answered during phone call. Informed that will have consultation with Dr. Rogue Bussing on Monday 3/29 at 9:30am. Contact info given and instructed to call with any further questions or needs. Pt's son verbalized understanding. Nothing further needed at this time.           Time Spent with Patient: 30 (07/01/19 1200)

## 2019-07-02 ENCOUNTER — Other Ambulatory Visit: Payer: Self-pay

## 2019-07-02 MED ORDER — HYDROCODONE-ACETAMINOPHEN 5-325 MG PO TABS: 1.0000 | ORAL_TABLET | Freq: Four times a day (QID) | ORAL | 0 refills | Status: DC | PRN

## 2019-07-02 NOTE — Telephone Encounter (Signed)
Pt son [name] called to check to see if HYDROcodone-acetaminophen (NORCO/VICODIN) 5-325 MG tablet could be increased. Medication is wearing off to quick  and the patient is taking more when he is not around, he states taht it is only working up to about 4 hours and she is in more pain.  [name],cma

## 2019-07-02 NOTE — Telephone Encounter (Signed)
Pt's son called and would like a call back. He said that the phone number was incorrect for when the people called in for the oxygen and home health. He said the pt declined both when they called her. I changed the phone number in her demographics for his number to be the primary number. He asked about the pain medication and I let him know that it has been called in already.

## 2019-07-02 NOTE — Telephone Encounter (Signed)
Sent to pharmacy 

## 2019-07-02 NOTE — Telephone Encounter (Signed)
I am hesitant to increase the dose so soon after starting this medication.  The goal for this medication is that it should help make things bearable with regards to her pain.  It may not remove the pain completely.  She should only take it as needed for severe pain.  I have placed the order for home health and we have completed the oxygen orders as well.  They should be contacted to get those things set up in the near future.

## 2019-07-02 NOTE — Telephone Encounter (Signed)
I spoke with the patient and he did not want a increase in the dose of pain medication he wanted a refill until Monday I sent this request to the provider.  Anvita Hirata,cma

## 2019-07-03 ENCOUNTER — Telehealth: Payer: Self-pay | Admitting: Pulmonary Disease

## 2019-07-03 ENCOUNTER — Encounter
Admission: RE | Admit: 2019-07-03 | Discharge: 2019-07-03 | Disposition: A | Discharge status: Home / Self Care | Payer: Medicare HMO | Source: Ambulatory Visit | Attending: Pulmonary Disease | Admitting: Pulmonary Disease

## 2019-07-03 ENCOUNTER — Other Ambulatory Visit: Payer: Self-pay

## 2019-07-03 DIAGNOSIS — R590 Localized enlarged lymph nodes: Secondary | ICD-10-CM | POA: Diagnosis not present

## 2019-07-03 DIAGNOSIS — M899 Disorder of bone, unspecified: Secondary | ICD-10-CM | POA: Insufficient documentation

## 2019-07-03 DIAGNOSIS — R918 Other nonspecific abnormal finding of lung field: Secondary | ICD-10-CM | POA: Insufficient documentation

## 2019-07-03 DIAGNOSIS — K769 Liver disease, unspecified: Secondary | ICD-10-CM | POA: Insufficient documentation

## 2019-07-03 LAB — GLUCOSE, CAPILLARY: Glucose-Capillary: 76 mg/dL (ref 70–99)

## 2019-07-03 MED ORDER — FLUDEOXYGLUCOSE F - 18 (FDG) INJECTION
5.5000 | Freq: Once | INTRAVENOUS | Status: AC | PRN
  Administered 2019-07-03: 5.88 via INTRAVENOUS

## 2019-07-03 NOTE — Telephone Encounter (Signed)
Received call from [place] with [facility] regarding pt's PET scan performed today 07/03/19. Impression below:  IMPRESSION: 1. Left upper lobe pulmonary mass is markedly hypermetabolic and is associated with hypermetabolic left hilar lymphadenopathy, large hypermetabolic liver lesions and a hypermetabolic L3 lesion that is amazingly subtle on CT imaging relative to its large size. Primary bronchogenic neoplasm with metastatic disease to the left hilum, liver, and lumbar spine would be a consideration. Given the suggestion of cirrhotic liver morphology and the liver and spinal lesions being larger than the lung lesion, primary hepatocellular carcinoma with metastatic disease to the lung and spine would also be a consideration. 2. The large L3 lesion involves the right L3 neuroforamen and right epidural space. Lumbar spine MRI with and without contrast may be warranted to further evaluate.     Dr. Patsey Berthold, please advise. Thanks.

## 2019-07-04 ENCOUNTER — Telehealth: Payer: Self-pay

## 2019-07-04 ENCOUNTER — Telehealth: Payer: Self-pay | Admitting: Family Medicine

## 2019-07-04 DIAGNOSIS — M899 Disorder of bone, unspecified: Secondary | ICD-10-CM

## 2019-07-04 NOTE — Telephone Encounter (Signed)
A [employer] portable  oxygen concentrator order form was signe and faxed with demographics and insurance information to [place] for the patient today @ 4:20 pm.  Confirmations given.  Camira Geidel,cma

## 2019-07-04 NOTE — Telephone Encounter (Signed)
Spoke with son after noting that her PET scan returned. Discussed the findings.  Advised on the need for lumbar spine MRI.  The patient's son noted that she has had some incontinence issues over several months.  No acute changes though has been potentially worse over that timeframe.  Given this I encouraged lumbar spine MRI and this was ordered.  Advised if she had any acute worsening or significant pain over the weekend to go to the emergency room.  We will get her MRI scheduled.  She will keep her appointment with oncology on Monday.

## 2019-07-07 ENCOUNTER — Ambulatory Visit
Admission: RE | Admit: 2019-07-07 | Discharge: 2019-07-07 | Disposition: A | Discharge status: Home / Self Care | Payer: Medicare HMO | Source: Ambulatory Visit | Attending: Internal Medicine | Admitting: Internal Medicine

## 2019-07-07 ENCOUNTER — Encounter: Payer: Self-pay | Admitting: Internal Medicine

## 2019-07-07 ENCOUNTER — Inpatient Hospital Stay: Payer: Medicare HMO

## 2019-07-07 ENCOUNTER — Inpatient Hospital Stay: Payer: Medicare HMO | Attending: Internal Medicine | Admitting: Internal Medicine

## 2019-07-07 VITALS — BP 129/81 | HR 82 | Temp 97.0°F | Wt 110.0 lb

## 2019-07-07 DIAGNOSIS — Z808 Family history of malignant neoplasm of other organs or systems: Secondary | ICD-10-CM | POA: Insufficient documentation

## 2019-07-07 DIAGNOSIS — Z800 Family history of malignant neoplasm of digestive organs: Secondary | ICD-10-CM | POA: Diagnosis not present

## 2019-07-07 DIAGNOSIS — M79651 Pain in right thigh: Secondary | ICD-10-CM

## 2019-07-07 DIAGNOSIS — J449 Chronic obstructive pulmonary disease, unspecified: Secondary | ICD-10-CM | POA: Diagnosis not present

## 2019-07-07 DIAGNOSIS — Z87891 Personal history of nicotine dependence: Secondary | ICD-10-CM | POA: Diagnosis not present

## 2019-07-07 DIAGNOSIS — R160 Hepatomegaly, not elsewhere classified: Secondary | ICD-10-CM | POA: Insufficient documentation

## 2019-07-07 DIAGNOSIS — Z803 Family history of malignant neoplasm of breast: Secondary | ICD-10-CM | POA: Diagnosis not present

## 2019-07-07 DIAGNOSIS — R918 Other nonspecific abnormal finding of lung field: Secondary | ICD-10-CM | POA: Insufficient documentation

## 2019-07-07 DIAGNOSIS — G9589 Other specified diseases of spinal cord: Secondary | ICD-10-CM

## 2019-07-07 LAB — CBC WITH DIFFERENTIAL/PLATELET
Abs Immature Granulocytes: 0.03 10*3/uL (ref 0.00–0.07)
Basophils Absolute: 0 10*3/uL (ref 0.0–0.1)
Basophils Relative: 0 %
Eosinophils Absolute: 0.1 10*3/uL (ref 0.0–0.5)
Eosinophils Relative: 1 %
HCT: 42.7 % (ref 36.0–46.0)
Hemoglobin: 13.8 g/dL (ref 12.0–15.0)
Immature Granulocytes: 0 %
Lymphocytes Relative: 16 %
Lymphs Abs: 1.1 10*3/uL (ref 0.7–4.0)
MCH: 30.7 pg (ref 26.0–34.0)
MCHC: 32.3 g/dL (ref 30.0–36.0)
MCV: 95.1 fL (ref 80.0–100.0)
Monocytes Absolute: 0.5 10*3/uL (ref 0.1–1.0)
Monocytes Relative: 7 %
Neutro Abs: 5 10*3/uL (ref 1.7–7.7)
Neutrophils Relative %: 76 %
Platelets: 309 10*3/uL (ref 150–400)
RBC: 4.49 MIL/uL (ref 3.87–5.11)
RDW: 12 % (ref 11.5–15.5)
WBC: 6.7 10*3/uL (ref 4.0–10.5)
nRBC: 0 % (ref 0.0–0.2)

## 2019-07-07 LAB — COMPREHENSIVE METABOLIC PANEL
ALT: 13 U/L (ref 0–44)
AST: 22 U/L (ref 15–41)
Albumin: 4.5 g/dL (ref 3.5–5.0)
Alkaline Phosphatase: 82 U/L (ref 38–126)
Anion gap: 9 (ref 5–15)
BUN: 13 mg/dL (ref 8–23)
CO2: 33 mmol/L — ABNORMAL HIGH (ref 22–32)
Calcium: 9.8 mg/dL (ref 8.9–10.3)
Chloride: 96 mmol/L — ABNORMAL LOW (ref 98–111)
Creatinine, Ser: 0.57 mg/dL (ref 0.44–1.00)
GFR calc Af Amer: >60 mL/min (ref >60)
GFR calc non Af Amer: >60 mL/min (ref >60)
Glucose, Bld: 117 mg/dL — ABNORMAL HIGH (ref 70–99)
Potassium: 3.8 mmol/L (ref 3.5–5.1)
Sodium: 138 mmol/L (ref 135–145)
Total Bilirubin: 0.7 mg/dL (ref 0.3–1.2)
Total Protein: 8.2 g/dL — ABNORMAL HIGH (ref 6.5–8.1)

## 2019-07-07 LAB — PROTIME-INR
INR: 1 (ref 0.8–1.2)
Prothrombin Time: 12.7 seconds (ref 11.4–15.2)

## 2019-07-07 LAB — HEPATITIS C ANTIBODY: HCV Ab: NONREACTIVE

## 2019-07-07 LAB — LACTATE DEHYDROGENASE: LDH: 246 U/L — ABNORMAL HIGH (ref 98–192)

## 2019-07-07 LAB — APTT: aPTT: 32 seconds (ref 24–36)

## 2019-07-07 NOTE — Telephone Encounter (Signed)
Will route to APP of the day as LG is not in office today

## 2019-07-07 NOTE — Telephone Encounter (Signed)
07/07/2019  I see the PET scan results. Left upper lobe pulmonary mass is hypermetabolic.  This is why we need to obtain tissue sampling via bronchoscopy.  Per chart review it appears patient was supposed to be getting a bronchoscopy from our office.  This was discussed on 06/27/2019 note.  Looks like patient needs to be rescheduled for a bronchoscopy for the first part of April with Dr. Patsey Berthold.  Looks like family is already aware regarding this.  Please route this message to Dr. Patsey Berthold as Juluis [place].  Please ensure the patient is scheduled for a bronchoscopy as outlined by the 06/27/2019 telephone note by Dr. Patsey Berthold.  If there are difficulties in getting the patient scheduled.  We may need to consider getting the patient scheduled in [facility] with another interventional pulmonologist.  This will need to be cleared through Dr. Patsey Berthold.  We will route back to [facility] triage pool, Lauren as well as [facility] procedure pool to work on getting this scheduled.  Wyn Quaker FNP

## 2019-07-07 NOTE — Progress Notes (Signed)
Met with Jane Davis and her son Jane Davis, alongside Dr. Rogue Bussing. Will arrange US liver biopsy. Explained further in detail. Once scheduled we will review instructions/prep. Rad Onc consult scheduled and reviewed. MRI lumbar spine scheduled and reviewed. She will keep brain MRI for this Friday. Labs today completed and pending. Right femur xray completed and result is pending. Encouraged Jane Davis to call with any questions or concerns.

## 2019-07-07 NOTE — Telephone Encounter (Signed)
Will keep message open to assure bronch gets scheduled accordingly.

## 2019-07-07 NOTE — Progress Notes (Signed)
[facility] NOTE  Patient Care Team: Leone Haven, MD as PCP - General (Family Medicine) Caryl Bis Angela Adam, MD as Consulting Physician (Family Medicine) Christene Lye, MD (General Surgery) Telford Nab, RN as Oncology Nurse Navigator Clent Jacks, RN as Oncology Nurse Navigator  CHIEF COMPLAINTS/PURPOSE OF CONSULTATION: lung nodule/liver mass/bone lesions  # MARCH 2021-CT scan [incidental-hospital admission for COPD exacerbation] PET scan-2.5 cm left lingular lesion; hilar adenopathy; 5.2 cm; 2.5 cm-hypermetabolic masses in the liver/cirrhotic; L3 right pedicle lesion  #COPD [Dr. Gonzalez]/smoker  Oncology History   No history exists.     HISTORY OF PRESENTING ILLNESS:  Jane Davis 86 y.o.  female history of smoking is here for further evaluation and recommendations for lung mass/nodule.  Patient was recently admitted to hospital for COPD exacerbation when a CT of the chest showed 2.5 cm lesion; with hilar adenopathy.  Patient was evaluated by pulmonary Dr. Rickey Barbara planning outpatient bronchoscopy.  The interim patient had a PET scan-that showed liver lesion bone lesions/see summarized above.  Patient awaiting brain MRI this Friday.  Patient is today accompanied by her son to discuss next treatment options  Patient's continues to have shortness of breath on exertion.  Awaiting home oxygen.  Mild cough.  Positive for phlegm.  Patient complains of pain in the right thigh-going on for the last 1 month.  Not getting any better.  Denies any back pain.  Denies headaches.  Review of Systems  Constitutional: Negative for chills, diaphoresis, fever, malaise/fatigue and weight loss.  HENT: Negative for nosebleeds and sore throat.   Eyes: Negative for double vision.  Respiratory: Positive for cough, sputum production (coffee ground) and shortness of breath. Negative for wheezing.   Cardiovascular: Negative for chest pain,  palpitations, orthopnea and leg swelling.  Gastrointestinal: Negative for abdominal pain, blood in stool, constipation, diarrhea, heartburn, melena, nausea and vomiting.  Genitourinary: Negative for dysuria, frequency and urgency.  Musculoskeletal: Positive for joint pain. Negative for back pain.  Skin: Negative.  Negative for itching and rash.  Neurological: Negative for dizziness, tingling, focal weakness, weakness and headaches.  Endo/Heme/Allergies: Does not bruise/bleed easily.  Psychiatric/Behavioral: Negative for depression. The patient is not nervous/anxious and does not have insomnia.      MEDICAL HISTORY:  Past Medical History:  Diagnosis Date  . COPD (chronic obstructive pulmonary disease) ([place])   . Essential hypertension 03/22/2016  . Glaucoma   . Hypertension   . Hypothyroidism   . Hypoxia 02/11/2016   Overview:  Discharged from hospital on home oxygen.  . Palpitations 03/22/2016  . Prediabetes 02/19/2015  . Rectal lesion 05/29/2016  . Urge incontinence of urine 05/29/2016    SURGICAL HISTORY: Past Surgical History:  Procedure Laterality Date  . ABDOMINAL HYSTERECTOMY    . COLONOSCOPY  2013   [place]     SOCIAL HISTORY: Social History   Socioeconomic History  . Marital status: Widowed    Spouse name: Not on file  . Number of children: Not on file  . Years of education: Not on file  . Highest education level: Not on file  Occupational History  . Not on file  Tobacco Use  . Smoking status: Former Smoker    Packs/day: 1.00    Years: 40.00    Pack years: 40.00    Types: Cigarettes    Quit date: 01/11/2016    Years since quitting: 3.4  . Smokeless tobacco: Never Used  Substance and Sexual Activity  . Alcohol use: No  .  Drug use: No  . Sexual activity: Never  Other Topics Concern  . Not on file  Social History Narrative   Lives with son; in [place]. Hx of smoking [quit 3 years ago]; no heavy alcohol; home maker.    Social Determinants of Health    Financial Resource Strain:   . Difficulty of Paying Living Expenses:   Food Insecurity:   . Worried About Charity fundraiser in the Last Year:   . Arboriculturist in the Last Year:   Transportation Needs:   . Film/video editor (Medical):   Marland Kitchen Lack of Transportation (Non-Medical):   Physical Activity:   . Days of Exercise per Week:   . Minutes of Exercise per Session:   Stress:   . Feeling of Stress :   Social Connections:   . Frequency of Communication with Friends and Family:   . Frequency of Social Gatherings with Friends and Family:   . Attends Religious Services:   . Active Member of Clubs or Organizations:   . Attends Archivist Meetings:   Marland Kitchen Marital Status:   Intimate Partner Violence:   . Fear of Current or Ex-Partner:   . Emotionally Abused:   Marland Kitchen Physically Abused:   . Sexually Abused:     FAMILY HISTORY: Family History  Problem Relation Age of Onset  . Intracerebral hemorrhage Mother   . Cancer Sister        breast  . Cancer Brother        colon; liver  . Bladder Cancer Neg Hx   . Prostate cancer Neg Hx     ALLERGIES:  has No Known Allergies.  MEDICATIONS:  Current Outpatient Medications  Medication Sig Dispense Refill  . albuterol (VENTOLIN HFA) 108 (90 Base) MCG/ACT inhaler Inhale 2 puffs into the lungs every 6 (six) hours as needed for wheezing or shortness of breath. 8 g 0  . aspirin EC 81 MG tablet Take 81 mg by mouth daily.    Marland Kitchen HYDROcodone-acetaminophen (NORCO/VICODIN) 5-325 MG tablet Take 1 tablet by mouth every 6 (six) hours as needed for severe pain. 15 tablet 0  . latanoprost (XALATAN) 0.005 % ophthalmic solution Place 1 drop into both eyes at bedtime.     Marland Kitchen levothyroxine (SYNTHROID) 75 MCG tablet Take 1 tablet (75 mcg total) by mouth daily. 90 tablet 1  . olmesartan (BENICAR) 20 MG tablet Take 1 tablet (20 mg total) by mouth daily. 90 tablet 1  . Timolol Maleate 0.5 % (DAILY) SOLN Place 1 drop into the right eye daily.    Marland Kitchen  umeclidinium-vilanterol (ANORO ELLIPTA) 62.5-25 MCG/INH AEPB Inhale 1 puff into the lungs daily. 30 each 0   No current facility-administered medications for this visit.      Marland Kitchen  PHYSICAL EXAMINATION: ECOG PERFORMANCE STATUS: 1 - Symptomatic but completely ambulatory  Vitals:   07/07/19 0922  BP: 129/81  Pulse: 82  Temp: (!) 97 F (36.1 C)   Filed Weights   07/07/19 0922  Weight: 110 lb (49.9 kg)    Physical Exam  Constitutional: She is oriented to person, place, and time and well-developed, well-nourished, and in no distress.  HENT:  Head: Normocephalic and atraumatic.  Mouth/Throat: Oropharynx is clear and moist. No oropharyngeal exudate.  Eyes: Pupils are equal, round, and reactive to light.  Cardiovascular: Normal rate and regular rhythm.  Pulmonary/Chest: No respiratory distress. She has no wheezes.  Decreased air entry bilaterally.  No wheeze or crackles.  Abdominal: Soft. Bowel  sounds are normal. She exhibits no distension and no mass. There is no abdominal tenderness. There is no rebound and no guarding.  Musculoskeletal:        General: No tenderness or edema. Normal range of motion.     Cervical back: Normal range of motion and neck supple.  Neurological: She is alert and oriented to person, place, and time.  Skin: Skin is warm.  Psychiatric: Affect normal.     LABORATORY DATA:  I have reviewed the data as listed Lab Results  Component Value Date   WBC 6.7 07/07/2019   HGB 13.8 07/07/2019   HCT 42.7 07/07/2019   MCV 95.1 07/07/2019   PLT 309 07/07/2019   Recent Labs    06/24/19 1723 06/24/19 1723 06/25/19 0422 06/26/19 0859 07/07/19 1027  NA 139   < > 138 134* 138  K 4.5   < > 4.0 4.4 3.8  CL 99   < > 99 93* 96*  CO2 33*   < > 33* 30 33*  GLUCOSE 114*   < > 91 155* 117*  BUN 11   < > 12 10 13   CREATININE 0.45   < > 0.48 0.60 0.57  CALCIUM 9.5   < > 9.0 9.5 9.8  GFRNONAA >60   < > >60 >60 >60  GFRAA >60   < > >60 >60 >60  PROT 7.8  --   --    --  8.2*  ALBUMIN 4.4  --   --   --  4.5  AST 23  --   --   --  22  ALT 13  --   --   --  13  ALKPHOS 83  --   --   --  82  BILITOT 0.6  --   --   --  0.7   < > = values in this interval not displayed.    RADIOGRAPHIC STUDIES: I have personally reviewed the radiological images as listed and agreed with the findings in the report. CT Angio Chest PE W and/or Wo Contrast  Result Date: 06/24/2019 CLINICAL DATA:  Hypoxia. Bilateral lower extremity pain. Nodule on chest radiography. EXAM: CT ANGIOGRAPHY CHEST WITH CONTRAST TECHNIQUE: Multidetector CT imaging of the chest was performed using the standard protocol during bolus administration of intravenous contrast. Multiplanar CT image reconstructions and MIPs were obtained to evaluate the vascular anatomy. CONTRAST:  67mL OMNIPAQUE IOHEXOL 350 MG/ML SOLN COMPARISON:  Chest radiography same day and 05/17/2018 FINDINGS: Cardiovascular: There are no pulmonary emboli. Heart size is normal. There is coronary artery calcification. There is aortic atherosclerosis. Mediastinum/Nodes: There is left hilar lymphadenopathy with the largest node measuring 19 mm in diameter. Lungs/Pleura: There is pleural and parenchymal scarring, most pronounced at the lung apices. 4.0 x 2.3 x 1.9 cm mass in the lingula worrisome for lung carcinoma. Left hilar lymphadenopathy as described above similarly worrisome. Upper Abdomen: Negative Musculoskeletal: Ordinary thoracic degenerative changes. Review of the MIP images confirms the above findings. IMPRESSION: No pulmonary emboli. 4.0 x 2.3 x 1.9 cm mass in the lingula worrisome for lung carcinoma. Left hilar lymphadenopathy. Aortic aneurysm NOS (ICD10-I71.9). Electronically Signed   By: Nelson Chimes M.D.   On: 06/24/2019 19:15   NM PET Image Initial (PI) Skull Base To Thigh  Result Date: 07/03/2019 CLINICAL DATA:  Initial treatment strategy for left upper lobe pulmonary mass. EXAM: NUCLEAR MEDICINE PET SKULL BASE TO THIGH TECHNIQUE:  5.9 mCi F-18 FDG was injected intravenously. Full-ring PET imaging was  performed from the skull base to thigh after the radiotracer. CT data was obtained and used for attenuation correction and anatomic localization. Fasting blood glucose: 76 mg/dl COMPARISON:  CTA 06/24/2019 FINDINGS: Mediastinal blood pool activity: SUV max 1.8 Liver activity: SUV max NA NECK: No hypermetabolic lymph nodes in the neck. Incidental CT findings: none CHEST: 4.0 x 2.3 x 1.9 cm left upper lobe pulmonary mass identified on recent chest CT is markedly hypermetabolic with SUV max = 26.8 hypermetabolic left hilar metastatic demonstrates SUV max = 6.8. no evidence for hypermetabolic mediastinal or right hilar metastases. Incidental CT findings: Coronary artery calcification is evident. Atherosclerotic calcification is noted in the wall of the thoracic aorta. Scattered tiny calcified and noncalcified pulmonary nodules better evaluated on the recent diagnostic CT chest. ABDOMEN/PELVIS: 5.2 cm lesion in the inferior left liver is markedly hypermetabolic with SUV max = 34.1. A second subtle liver lesion on noncontrast CT imaging measures 2.3 cm in the anterior right liver with SUV max = 12.5. No hypermetabolic lymphadenopathy in the abdomen or pelvis. Relatively long segment non focal uptake in the right colon is presumably physiologic with no underlying colonic mass lesion evident by CT. Incidental CT findings: There is a subtle suggestion of contour nodularity in the posterior right liver (see axial 151/3) raising the question of cirrhosis. There is abdominal aortic atherosclerosis without aneurysm. SKELETON: A large hypermetabolic lesion is identified at the level of L3, involving the right posterolateral L3 vertebral body, right pedicle, transverse process, lamina and spinous process. Subtle soft tissue component is identified in the right L3-4 foramen, right aspect of the spinal canal and in the right paraspinal muscles at this level. SUV  max = 14.5 Incidental CT findings: none IMPRESSION: 1. Left upper lobe pulmonary mass is markedly hypermetabolic and is associated with hypermetabolic left hilar lymphadenopathy, large hypermetabolic liver lesions and a hypermetabolic L3 lesion that is amazingly subtle on CT imaging relative to its large size. Primary bronchogenic neoplasm with metastatic disease to the left hilum, liver, and lumbar spine would be a consideration. Given the suggestion of cirrhotic liver morphology and the liver and spinal lesions being larger than the lung lesion, primary hepatocellular carcinoma with metastatic disease to the lung and spine would also be a consideration. 2. The large L3 lesion involves the right L3 neuroforamen and right epidural space. Lumbar spine MRI with and without contrast may be warranted to further evaluate. These results will be called to the ordering clinician or representative by the Radiologist Assistant, and communication documented in the PACS or [employer]. Electronically Signed   By: Misty Stanley M.D.   On: 07/03/2019 13:10   US Venous Img Lower Bilateral (DVT)  Result Date: 06/25/2019 CLINICAL DATA:  Bilateral lower extremity pain for the past 3 weeks. Evaluate for DVT. EXAM: BILATERAL LOWER EXTREMITY VENOUS DOPPLER ULTRASOUND TECHNIQUE: Gray-scale sonography with graded compression, as well as color Doppler and duplex ultrasound were performed to evaluate the lower extremity deep venous systems from the level of the common femoral vein and including the common femoral, femoral, profunda femoral, popliteal and calf veins including the posterior tibial, peroneal and gastrocnemius veins when visible. The superficial great saphenous vein was also interrogated. Spectral Doppler was utilized to evaluate flow at rest and with distal augmentation maneuvers in the common femoral, femoral and popliteal veins. COMPARISON:  None. FINDINGS: RIGHT LOWER EXTREMITY Common Femoral Vein: No evidence of  thrombus. Normal compressibility, respiratory phasicity and response to augmentation. Saphenofemoral Junction: No evidence of thrombus. Normal  compressibility and flow on color Doppler imaging. Profunda Femoral Vein: No evidence of thrombus. Normal compressibility and flow on color Doppler imaging. Femoral Vein: No evidence of thrombus. Normal compressibility, respiratory phasicity and response to augmentation. Popliteal Vein: No evidence of thrombus. Normal compressibility, respiratory phasicity and response to augmentation. Calf Veins: No evidence of thrombus. Normal compressibility and flow on color Doppler imaging. Superficial Great Saphenous Vein: No evidence of thrombus. Normal compressibility. Venous Reflux:  None. Other Findings:  None. LEFT LOWER EXTREMITY Common Femoral Vein: No evidence of thrombus. Normal compressibility, respiratory phasicity and response to augmentation. Saphenofemoral Junction: No evidence of thrombus. Normal compressibility and flow on color Doppler imaging. Profunda Femoral Vein: No evidence of thrombus. Normal compressibility and flow on color Doppler imaging. Femoral Vein: No evidence of thrombus. Normal compressibility, respiratory phasicity and response to augmentation. Popliteal Vein: No evidence of thrombus. Normal compressibility, respiratory phasicity and response to augmentation. Calf Veins: No evidence of thrombus. Normal compressibility and flow on color Doppler imaging. Superficial Great Saphenous Vein: No evidence of thrombus. Normal compressibility. Venous Reflux:  None. Other Findings:  None. IMPRESSION: No evidence of DVT within either lower extremity. Electronically Signed   By: Sandi Mariscal M.D.   On: 06/25/2019 16:48   DG Chest Portable 1 View  Result Date: 06/24/2019 CLINICAL DATA:  Hypoxia EXAM: PORTABLE CHEST 1 VIEW COMPARISON:  05/17/2018, 02/07/2016 FINDINGS: Hyperinflation with emphysematous disease and bronchitic changes. 2.5 cm oval opacity in the left  perihilar region. Normal cardiomediastinal silhouette with aortic atherosclerosis. No pneumothorax. Apical pleural and parenchymal scarring. IMPRESSION: 1. Emphysematous disease with bronchitic changes. 2. 2.5 cm oval opacity in the left perihilar region, possibly representing a lung nodule. Chest CT is recommended for further evaluation. Electronically Signed   By: Donavan Foil M.D.   On: 06/24/2019 17:56   DG HIP UNILAT WITH PELVIS 2-3 VIEWS RIGHT  Result Date: 06/24/2019 CLINICAL DATA:  Right hip pain.  No known injury. EXAM: DG HIP (WITH OR WITHOUT PELVIS) 2-3V RIGHT COMPARISON:  None. FINDINGS: There is diffuse osteopenia. There is no acute displaced fracture or dislocation. There is some sclerosis visualized in the transcervical region of the proximal right femur. Mild degenerative changes are noted of both hips. IMPRESSION: 1. No definite acute displaced fracture or dislocation. 2. Subtle sclerosis in the transcervical region may represent artifact. However, if there is high clinical suspicion follow-up with MRI is recommended given the presence of diffuse osteopenia. Electronically Signed   By: Constance Holster M.D.   On: 06/24/2019 20:53    ASSESSMENT & PLAN:   Liver mass, right lobe #Hypermetabolic liver masses noted; in the context of the lung nodule; bone lesions highly suspicious for aggressive malignancy.  Given the cirrhotic appearance would recommend hepatitis work-up/AFP.  #I would recommend ultrasound-guided liver biopsy as this seems to be accessible.  Will discuss with IR.   #Left lingular-2.5 cm lesion concerning for primary.  Patient awaiting evaluation with pulmonary Dr. Patsey Berthold next week.   #Right thigh pain-poorly controlled.  Unclear if this is of lesion in the right femur versus radiation of the L3 lesions-as started on PET scan.  Recommend x-rays/MRI lumbar spine.  Recommend Zometa.  Discussed hypocalcemia osteonecrosis of the jaw.  Recommend taking the hydrocodone up  to 4 times a day; and also add Aleve or Motrin for pain control.  Refer to radiation.  # COPD-needing home O2; overall stable.  On inhalers.  Thank you for allowing me to participate in the care of your pleasant patient.  Please do not hesitate to contact me with questions or concerns in the interim.  Discussed with the patient's son at length.  Also discussed with Roderic Ovens   # DISPOSITION: # labs today-  # X-ray femur # radiation referral # MRI lumbar spine ASAP [MRI brain Mri-friday] # US liver Biopsy # follow up later next week- MD;Zometa- Dr.B  # I reviewed the blood work- with the patient in detail; also reviewed the imaging independently [as summarized above]; and with the patient in detail.    All questions were answered. The patient knows to call the clinic with any problems, questions or concerns.    Cammie Sickle, MD 07/07/2019 12:23 PM

## 2019-07-07 NOTE — Assessment & Plan Note (Addendum)
#  Hypermetabolic liver masses noted; in the context of the lung nodule; bone lesions highly suspicious for aggressive malignancy.  Given the cirrhotic appearance would recommend hepatitis work-up/AFP.  #I would recommend ultrasound-guided liver biopsy as this seems to be accessible.  Will discuss with IR.   #Left lingular-2.5 cm lesion concerning for primary.  Patient awaiting evaluation with pulmonary Dr. Patsey Berthold next week.   #Right thigh pain-poorly controlled.  Unclear if this is of lesion in the right femur versus radiation of the L3 lesions-as started on PET scan.  Recommend x-rays/MRI lumbar spine.  Recommend Zometa.  Discussed hypocalcemia osteonecrosis of the jaw.  Recommend taking the hydrocodone up to 4 times a day; and also add Aleve or Motrin for pain control.  Refer to radiation.  # COPD-needing home O2; overall stable.  On inhalers.  Thank you for allowing me to participate in the care of your pleasant patient. Please do not hesitate to contact me with questions or concerns in the interim.  Discussed with the patient's son at length.  Also discussed with Roderic Ovens   # DISPOSITION: # labs today-  # X-ray femur # radiation referral # MRI lumbar spine ASAP [MRI brain Mri-friday] # US liver Biopsy # follow up later next week- MD;[facility]ometa- Dr.B  # I reviewed the blood work- with the patient in detail; also reviewed the imaging independently [as summarized above]; and with the patient in detail.

## 2019-07-08 ENCOUNTER — Other Ambulatory Visit: Payer: Self-pay

## 2019-07-08 ENCOUNTER — Encounter: Payer: Self-pay | Admitting: Family Medicine

## 2019-07-08 ENCOUNTER — Telehealth: Payer: Self-pay | Admitting: Family Medicine

## 2019-07-08 ENCOUNTER — Ambulatory Visit
Admission: RE | Admit: 2019-07-08 | Discharge: 2019-07-08 | Disposition: A | Discharge status: Home / Self Care | Payer: Medicare HMO | Source: Ambulatory Visit | Attending: Internal Medicine | Admitting: Internal Medicine

## 2019-07-08 ENCOUNTER — Other Ambulatory Visit: Payer: Self-pay | Admitting: Internal Medicine

## 2019-07-08 ENCOUNTER — Encounter: Payer: Self-pay | Admitting: *Deleted

## 2019-07-08 DIAGNOSIS — M5126 Other intervertebral disc displacement, lumbar region: Secondary | ICD-10-CM | POA: Diagnosis not present

## 2019-07-08 DIAGNOSIS — G9589 Other specified diseases of spinal cord: Secondary | ICD-10-CM

## 2019-07-08 LAB — HEPATITIS B SURFACE ANTIBODY, QUANTITATIVE: Hep B S AB Quant (Post): <3.1 m[IU]/mL — ABNORMAL LOW (ref >9.9)

## 2019-07-08 LAB — AFP TUMOR MARKER: AFP, Serum, Tumor Marker: 2.6 ng/mL (ref 0.0–8.3)

## 2019-07-08 MED ORDER — DEXAMETHASONE 2 MG PO TABS: ORAL_TABLET | ORAL | 0 refills | Status: DC

## 2019-07-08 MED ORDER — HYDROCODONE-ACETAMINOPHEN 5-325 MG PO TABS: 1.0000 | ORAL_TABLET | Freq: Four times a day (QID) | ORAL | 0 refills | Status: DC | PRN

## 2019-07-08 MED ORDER — GADOBUTROL 1 MMOL/ML IV SOLN
4.0000 mL | Freq: Once | INTRAVENOUS | Status: AC | PRN
  Administered 2019-07-08: 4 mL via INTRAVENOUS

## 2019-07-08 NOTE — Telephone Encounter (Signed)
Jane Davis is calling about oxygen for the home and wants to know about handicap placard form. Please call back

## 2019-07-08 NOTE — Telephone Encounter (Signed)
Noted  

## 2019-07-08 NOTE — Telephone Encounter (Signed)
I called the patients son and informed him that his handicap placard was ready to be picked up, patient stated he had not heard from [place] about the oxygen, I also informed him that I would put the number to [employer] in the envelope with the handicap form for him to call and check on the status of the oxygen.  Caralina Nop,cma

## 2019-07-08 NOTE — Progress Notes (Signed)
On 3/30- spoke to pts' son- reviewed the results of MRI. Recommend adding dex; also re-newed the script for hydorcodone.   Please add to Josh at next visit.  Thanks GB

## 2019-07-08 NOTE — Telephone Encounter (Signed)
Previous phone note was sent to LG to let us know what day is best for her at the beginning of April.   Will wait to for LG to return to office the week of April 8

## 2019-07-08 NOTE — Telephone Encounter (Signed)
Jane Davis called back . Please call at your convenience.

## 2019-07-09 ENCOUNTER — Other Ambulatory Visit: Payer: Self-pay | Admitting: Internal Medicine

## 2019-07-09 ENCOUNTER — Telehealth: Payer: Self-pay

## 2019-07-09 DIAGNOSIS — C7951 Secondary malignant neoplasm of bone: Secondary | ICD-10-CM | POA: Insufficient documentation

## 2019-07-09 NOTE — Telephone Encounter (Signed)
Called and spoke with Jane Davis regarding liver biopsy. It is scheduled for 0900 with arrival time of 0800 on 07/14/19. Arrive at the [facility]. Do not eat or drink anything after midnight and must have a driver due to sedation. Explained to Jane Davis that a nurse from IR will also be calling to go over any further instructions needed.Encouraged to call with any needs. Reports that leg pain is much improved with more routine /scheduled use of oxycodone with ibuprofen for break through pain. All other upcoming appointments reviewed.

## 2019-07-10 DIAGNOSIS — J449 Chronic obstructive pulmonary disease, unspecified: Secondary | ICD-10-CM | POA: Diagnosis not present

## 2019-07-10 DIAGNOSIS — J9601 Acute respiratory failure with hypoxia: Secondary | ICD-10-CM | POA: Diagnosis not present

## 2019-07-10 NOTE — Progress Notes (Signed)
C- another day with Merrily Pew should be fine.  GB

## 2019-07-10 NOTE — Progress Notes (Signed)
Patient on schedule for Liver biopsy 07/14/2019, called and spoke today with son with instructions given to be here @ 0800, NPO after MN, heart and bp meds with sip of water, on 07/14/2019, stated understanding.

## 2019-07-11 ENCOUNTER — Ambulatory Visit
Admission: RE | Admit: 2019-07-11 | Discharge: 2019-07-11 | Disposition: A | Discharge status: Home / Self Care | Payer: Medicare HMO | Source: Ambulatory Visit | Attending: Pulmonary Disease | Admitting: Pulmonary Disease

## 2019-07-11 ENCOUNTER — Other Ambulatory Visit: Payer: Self-pay

## 2019-07-11 ENCOUNTER — Telehealth: Payer: Self-pay | Admitting: *Deleted

## 2019-07-11 ENCOUNTER — Encounter: Payer: Self-pay | Admitting: *Deleted

## 2019-07-11 ENCOUNTER — Other Ambulatory Visit: Payer: Self-pay | Admitting: Radiology

## 2019-07-11 DIAGNOSIS — C7931 Secondary malignant neoplasm of brain: Secondary | ICD-10-CM | POA: Diagnosis not present

## 2019-07-11 DIAGNOSIS — R918 Other nonspecific abnormal finding of lung field: Secondary | ICD-10-CM | POA: Diagnosis not present

## 2019-07-11 MED ORDER — GADOBUTROL 1 MMOL/ML IV SOLN
5.0000 mL | Freq: Once | INTRAVENOUS | Status: AC | PRN
  Administered 2019-07-11: 13:00:00 5 mL via INTRAVENOUS

## 2019-07-11 NOTE — Telephone Encounter (Signed)
Son sent my chart msg to request for norco script for patient. Explained to patient's son that MD sent in #[phone] on 3/30.

## 2019-07-11 NOTE — Telephone Encounter (Signed)
Dr. Rogue Bussing set for biopsy of liver lesion.

## 2019-07-14 ENCOUNTER — Ambulatory Visit
Admission: RE | Admit: 2019-07-14 | Discharge: 2019-07-14 | Disposition: A | Discharge status: Home / Self Care | Payer: Medicare HMO | Source: Ambulatory Visit | Attending: Internal Medicine | Admitting: Internal Medicine

## 2019-07-14 ENCOUNTER — Other Ambulatory Visit: Payer: Self-pay

## 2019-07-14 ENCOUNTER — Ambulatory Visit: Payer: Medicare HMO

## 2019-07-14 DIAGNOSIS — R7303 Prediabetes: Secondary | ICD-10-CM | POA: Insufficient documentation

## 2019-07-14 DIAGNOSIS — Z87891 Personal history of nicotine dependence: Secondary | ICD-10-CM | POA: Insufficient documentation

## 2019-07-14 DIAGNOSIS — C787 Secondary malignant neoplasm of liver and intrahepatic bile duct: Secondary | ICD-10-CM | POA: Diagnosis not present

## 2019-07-14 DIAGNOSIS — Z7982 Long term (current) use of aspirin: Secondary | ICD-10-CM | POA: Insufficient documentation

## 2019-07-14 DIAGNOSIS — R590 Localized enlarged lymph nodes: Secondary | ICD-10-CM | POA: Insufficient documentation

## 2019-07-14 DIAGNOSIS — M25561 Pain in right knee: Secondary | ICD-10-CM | POA: Diagnosis not present

## 2019-07-14 DIAGNOSIS — I10 Essential (primary) hypertension: Secondary | ICD-10-CM | POA: Diagnosis not present

## 2019-07-14 DIAGNOSIS — R160 Hepatomegaly, not elsewhere classified: Secondary | ICD-10-CM | POA: Diagnosis not present

## 2019-07-14 DIAGNOSIS — Z79899 Other long term (current) drug therapy: Secondary | ICD-10-CM | POA: Insufficient documentation

## 2019-07-14 DIAGNOSIS — K769 Liver disease, unspecified: Secondary | ICD-10-CM | POA: Insufficient documentation

## 2019-07-14 DIAGNOSIS — J449 Chronic obstructive pulmonary disease, unspecified: Secondary | ICD-10-CM | POA: Insufficient documentation

## 2019-07-14 DIAGNOSIS — E039 Hypothyroidism, unspecified: Secondary | ICD-10-CM | POA: Insufficient documentation

## 2019-07-14 LAB — CBC
HCT: 43.6 % (ref 36.0–46.0)
Hemoglobin: 14.1 g/dL (ref 12.0–15.0)
MCH: 30.8 pg (ref 26.0–34.0)
MCHC: 32.3 g/dL (ref 30.0–36.0)
MCV: 95.2 fL (ref 80.0–100.0)
Platelets: 350 10*3/uL (ref 150–400)
RBC: 4.58 MIL/uL (ref 3.87–5.11)
RDW: 12.4 % (ref 11.5–15.5)
WBC: 11.5 10*3/uL — ABNORMAL HIGH (ref 4.0–10.5)
nRBC: 0 % (ref 0.0–0.2)

## 2019-07-14 LAB — PROTIME-INR
INR: 0.9 (ref 0.8–1.2)
Prothrombin Time: 12.1 seconds (ref 11.4–15.2)

## 2019-07-14 MED ORDER — MIDAZOLAM HCL 2 MG/2ML IJ SOLN
INTRAMUSCULAR | Status: AC
  Filled 2019-07-14: qty 2

## 2019-07-14 MED ORDER — MIDAZOLAM HCL 2 MG/2ML IJ SOLN
INTRAMUSCULAR | Status: AC | PRN
  Administered 2019-07-14 (×2): 0.5 mg via INTRAVENOUS

## 2019-07-14 MED ORDER — FENTANYL CITRATE (PF) 100 MCG/2ML IJ SOLN
INTRAMUSCULAR | Status: AC
  Filled 2019-07-14: qty 2

## 2019-07-14 MED ORDER — FENTANYL CITRATE (PF) 100 MCG/2ML IJ SOLN
INTRAMUSCULAR | Status: AC | PRN
  Administered 2019-07-14 (×2): 25 ug via INTRAVENOUS

## 2019-07-14 MED ORDER — SODIUM CHLORIDE 0.9 % IV SOLN: INTRAVENOUS | Status: DC

## 2019-07-14 NOTE — Telephone Encounter (Signed)
Will sign off on message. Please see message from 3.25.21.

## 2019-07-14 NOTE — Discharge Instructions (Signed)

## 2019-07-14 NOTE — Telephone Encounter (Signed)
Yes we will be holding off on the bronc since the liver lesion was noted on PET/CT (not seen on the regular CT) I had discussed with Dr. Rogue Bussing in agreement that liver biopsy should give Jane Davis the answer without having to put her under general anesthesia for the procedure.  I also discussed with Dr. 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 Rogue Bussing is directing her oncologic care and will likely address this issue when he sees her in follow-up.

## 2019-07-14 NOTE — Progress Notes (Signed)
Patient clinically stable post Liver Biopsy per Dr Pascal Lux, tolerated well, vitals stable throughout, awake/alert and oriented post procedure. Received Versed 1mg  along with Fentanyl 60mcg IV for procedure. Report given to Carlynn Spry RN post procedure with questions answered.

## 2019-07-14 NOTE — H&P (Signed)
Chief Complaint: Patient was seen in consultation today for liver mass biopsy.  Referring Physician(s): Cammie Sickle  Supervising Physician: Sandi Mariscal  Patient Status: [facility] - Out-pt  History of Present Illness: Jane Davis is a 86 y.o. female with a past medical history significant for glaucoma, hypothyroidism, HTN, tobacco use and COPD who presents today for a liver mass biopsy. Jane Davis was admitted 3/16/-3/18 for a COPD exacerbation and was found to have a 4.0 x 2.3 x 1.9 cm mass in the lingula worrisome for lung carcinoma. Pulmonology was consulted and recommended outpatient bronchoscopy. She was seen by Dr. Rogue Bussing as an outpatient and underwent PET scan which showed a markedly hypermetabolic left upper lobe mass, left hilar lymphadenopathy, a large hypermetabolic liver lesion and a hypermetabolic L3 lesion involving the right L3 neuroforamen and right epidural space all of which concerning for metastatic disease. IR has been consulted for a liver lesion biopsy to help guide further care.  [name] states she was aware she was having a biopsy today but thought that it was going to be on her lung, however she is agreeable to proceed with the liver lesion biopsy today. She reports right knee pain but denies any other complaints. She states understanding of the requested procedure and wishes to proceed.  Past Medical History:  Diagnosis Date  . COPD (chronic obstructive pulmonary disease) ([place])   . Essential hypertension 03/22/2016  . Glaucoma   . Hypertension   . Hypothyroidism   . Hypoxia 02/11/2016   Overview:  Discharged from hospital on home oxygen.  . Palpitations 03/22/2016  . Prediabetes 02/19/2015  . Rectal lesion 05/29/2016  . Urge incontinence of urine 05/29/2016    Past Surgical History:  Procedure Laterality Date  . ABDOMINAL HYSTERECTOMY    . COLONOSCOPY  2013   [place]     Allergies: Patient has no known  allergies.  Medications: Prior to Admission medications   Medication Sig Start Date End Date Taking? Authorizing Provider  albuterol (VENTOLIN HFA) 108 (90 Base) MCG/ACT inhaler Inhale 2 puffs into the lungs every 6 (six) hours as needed for wheezing or shortness of breath. 06/26/19  Yes Jennye Boroughs, MD  aspirin EC 81 MG tablet Take 81 mg by mouth daily.   Yes [provider]  dexamethasone (DECADRON) 2 MG tablet 1 pill every 8 hours- take with meals 07/08/19  Yes Cammie Sickle, MD  HYDROcodone-acetaminophen (NORCO/VICODIN) 5-325 MG tablet Take 1 tablet by mouth every 6 (six) hours as needed for severe pain. 07/08/19  Yes Cammie Sickle, MD  latanoprost (XALATAN) 0.005 % ophthalmic solution Place 1 drop into both eyes at bedtime.    Yes [provider]  levothyroxine (SYNTHROID) 75 MCG tablet Take 1 tablet (75 mcg total) by mouth daily. 03/31/19  Yes Leone Haven, MD  olmesartan (BENICAR) 20 MG tablet Take 1 tablet (20 mg total) by mouth daily. 03/31/19  Yes Leone Haven, MD  Timolol Maleate 0.5 % (DAILY) SOLN Place 1 drop into the right eye daily.   Yes [provider]  umeclidinium-vilanterol (ANORO ELLIPTA) 62.5-25 MCG/INH AEPB Inhale 1 puff into the lungs daily. 06/27/19 07/27/19  Jennye Boroughs, MD     Family History  Problem Relation Age of Onset  . Intracerebral hemorrhage Mother   . Cancer Sister        breast  . Cancer Brother        colon; liver  . Bladder Cancer Neg Hx   . Prostate  cancer Neg Hx     Social History   Socioeconomic History  . Marital status: Widowed    Spouse name: Not on file  . Number of children: Not on file  . Years of education: Not on file  . Highest education level: Not on file  Occupational History  . Not on file  Tobacco Use  . Smoking status: Former Smoker    Packs/day: 1.00    Years: 40.00    Pack years: 40.00    Types: Cigarettes    Quit date: 01/11/2016    Years since quitting: 3.5   . Smokeless tobacco: Never Used  Substance and Sexual Activity  . Alcohol use: No  . Drug use: No  . Sexual activity: Never  Other Topics Concern  . Not on file  Social History Narrative   Lives with son; in [place]. Hx of smoking [quit 3 years ago]; no heavy alcohol; home maker.    Social Determinants of Health   Financial Resource Strain:   . Difficulty of Paying Living Expenses:   Food Insecurity:   . Worried About Charity fundraiser in the Last Year:   . Arboriculturist in the Last Year:   Transportation Needs:   . Film/video editor (Medical):   Marland Kitchen Lack of Transportation (Non-Medical):   Physical Activity:   . Days of Exercise per Week:   . Minutes of Exercise per Session:   Stress:   . Feeling of Stress :   Social Connections:   . Frequency of Communication with Friends and Family:   . Frequency of Social Gatherings with Friends and Family:   . Attends Religious Services:   . Active Member of Clubs or Organizations:   . Attends Archivist Meetings:   Marland Kitchen Marital Status:      Review of Systems: A 12 point ROS discussed and pertinent positives are indicated in the HPI above.  All other systems are negative.  Review of Systems  Constitutional: Negative for appetite change, chills and fever.  Respiratory: Negative for cough and shortness of breath.   Cardiovascular: Negative for chest pain.  Gastrointestinal: Negative for abdominal pain, blood in stool, diarrhea, nausea and vomiting.  Musculoskeletal: Positive for arthralgias (right knee).  Skin: Negative for color change.  Neurological: Negative for dizziness and headaches.    Vital Signs: There were no vitals taken for this visit.  Physical Exam Constitutional:      General: She is not in acute distress. HENT:     Head: Normocephalic.     Mouth/Throat:     Mouth: Mucous membranes are moist.     Pharynx: Oropharynx is clear. No oropharyngeal exudate or posterior oropharyngeal erythema.   Cardiovascular:     Rate and Rhythm: Normal rate and regular rhythm.  Pulmonary:     Effort: Pulmonary effort is normal.     Breath sounds: Normal breath sounds.  Abdominal:     General: Bowel sounds are normal. There is no distension.     Palpations: Abdomen is soft.     Tenderness: There is no abdominal tenderness.  Skin:    General: Skin is warm and dry.     Coloration: Skin is not jaundiced.  Neurological:     Mental Status: She is alert and oriented to person, place, and time.  Psychiatric:        Mood and Affect: Mood normal.        Behavior: Behavior normal.  Thought Content: Thought content normal.        Judgment: Judgment normal.      MD Evaluation Airway: WNL Heart: WNL Abdomen: WNL Chest/ Lungs: WNL ASA  Classification: 2 Mallampati/Airway Score: Two   Imaging: CT Angio Chest PE W and/or Wo Contrast  Result Date: 06/24/2019 CLINICAL DATA:  Hypoxia. Bilateral lower extremity pain. Nodule on chest radiography. EXAM: CT ANGIOGRAPHY CHEST WITH CONTRAST TECHNIQUE: Multidetector CT imaging of the chest was performed using the standard protocol during bolus administration of intravenous contrast. Multiplanar CT image reconstructions and MIPs were obtained to evaluate the vascular anatomy. CONTRAST:  67mL OMNIPAQUE IOHEXOL 350 MG/ML SOLN COMPARISON:  Chest radiography same day and 05/17/2018 FINDINGS: Cardiovascular: There are no pulmonary emboli. Heart size is normal. There is coronary artery calcification. There is aortic atherosclerosis. Mediastinum/Nodes: There is left hilar lymphadenopathy with the largest node measuring 19 mm in diameter. Lungs/Pleura: There is pleural and parenchymal scarring, most pronounced at the lung apices. 4.0 x 2.3 x 1.9 cm mass in the lingula worrisome for lung carcinoma. Left hilar lymphadenopathy as described above similarly worrisome. Upper Abdomen: Negative Musculoskeletal: Ordinary thoracic degenerative changes. Review of the MIP  images confirms the above findings. IMPRESSION: No pulmonary emboli. 4.0 x 2.3 x 1.9 cm mass in the lingula worrisome for lung carcinoma. Left hilar lymphadenopathy. Aortic aneurysm NOS (ICD10-I71.9). Electronically Signed   By: Nelson Chimes M.D.   On: 06/24/2019 19:15   MR BRAIN W WO CONTRAST  Result Date: 07/11/2019 CLINICAL DATA:  New diagnosis lung mass.  Staging. EXAM: MRI HEAD WITHOUT AND WITH CONTRAST TECHNIQUE: Multiplanar, multiecho pulse sequences of the brain and surrounding structures were obtained without and with intravenous contrast. CONTRAST:  31mL GADAVIST GADOBUTROL 1 MMOL/ML IV SOLN COMPARISON:  None. FINDINGS: Brain: There are multiple intracranial metastatic lesions. The majority of these show poor contrast enhancement in are actually better seen on the diffusion imaging as foci of restricted diffusion. Lesions are identified using a combination of axial and coronal diffusion imaging as well as the postcontrast imaging. Lesions are as follows. 2 mm metastasis inferior medial right cerebellum. 6-7 mm metastasis left ventral pons. Question of a punctate metastasis at the cerebellar vermian tip. 2 mm metastasis in the right anterior thalamus. 2 mm metastasis in the left posterior limb internal capsule. 3 mm metastasis at the medial right frontal lobe. 5 mm metastasis of the right parietal cortex. 7 mm metastasis at the right frontal cortex at the vertex. 3 mm metastasis of the left frontal cortex at the vertex. 5 mm metastasis of the inferior temporal lobe on the right. 4 mm metastasis lateral surface of the left temporal lobe. 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 Cerebral hemispheres show mild to moderate chronic small-vessel changes of the white matter considering age. No hydrocephalus. No extra-axial collection. 4 mm metastasis of the inferior frontal lobe on the left. Vascular: Major vessels at the base of the brain show flow. Skull and upper cervical spine: No bone  metastases identified. Sinuses/Orbits: Clear/normal Other: None IMPRESSION: At least 11 metastases scattered throughout the infratentorial and supratentorial brain as outlined above. Lesions show poor or no enhancement and are actually better seen on the diffusion imaging, therefore more difficult than usual to localize and documented, requiring utilization of both the axial and coronal diffusion imaging as well as the postcontrast T1 imaging. None of the lesions is large or associated with mass effect, hemorrhage or vasogenic edema. Electronically Signed   By: Elta Guadeloupe  Shogry M.D.   On: 07/11/2019 13:10   MR Lumbar Spine W Wo Contrast  Result Date: 07/08/2019 CLINICAL DATA:  Left upper lobe pulmonary mass. EXAM: MRI LUMBAR SPINE WITHOUT AND WITH CONTRAST TECHNIQUE: Multiplanar and multiecho pulse sequences of the lumbar spine were obtained without and with intravenous contrast. CONTRAST:  64mL GADAVIST GADOBUTROL 1 MMOL/ML IV SOLN COMPARISON:  PET scan 06/05/2019 FINDINGS: Segmentation: 5 non rib-bearing lumbar type vertebral bodies are present. The lowest fully formed vertebral body is L5. Alignment: No significant listhesis is present. Rightward curvature is centered at L3. Vertebrae: Infiltrative mass is again noted at L3. This involves the right side of the vertebral body. There is diffuse infiltration of the posterior elements including the spinous process. Tumor is more prominent right than left. Tumor extends into the epidural space and fills the right neural foramen at L3-4. Enhancing tumor mass measures 5.0 x 2.7 on axial images. Epidural extension measures cephalo caudad 3.2 cm, predominantly on the right. No other focal enhancement is present to suggest tumor elsewhere. Chronic endplate marrow changes are noted. Conus medullaris and cauda equina: Conus extends to the L1 level. Conus and cauda equina appear normal. Paraspinal and other soft tissues: A 1.4 cm simple cyst is present anteriorly in the right  kidney. Solid organs are otherwise within normal limits. No significant adenopathy is present. Disc levels: L1-2: Negative. L2-3: Negative. L3-4: A broad-based disc protrusion is present. There is some tumor crow chin into the right neural foramen. The left neural foramen is patent. Tumor impacts the right subarticular recess is well. L4-5: Chronic loss of disc height is present. Mild broad-based bulging is present. Mild facet hypertrophy is noted bilaterally. No focal stenosis is evident. L5-S1: Asymmetric left-sided facet hypertrophy is noted. A broad-based disc protrusion is present. This results in mild left foraminal narrowing. IMPRESSION: 1. Infiltrative mass lesion involving the right side of the L3 vertebral body and posterior elements likely represents metastatic disease from the lung or liver primary. 2. Tumor impacts the right subarticular recess and right neural foramen at L3-4. 3. Mild left foraminal narrowing at L5-S1. 4. Mild broad-based disc bulging and facet hypertrophy at L4-5 without significant stenosis. Electronically Signed   By: San Morelle M.D.   On: 07/08/2019 12:28   NM PET Image Initial (PI) Skull Base To Thigh  Result Date: 07/03/2019 CLINICAL DATA:  Initial treatment strategy for left upper lobe pulmonary mass. EXAM: NUCLEAR MEDICINE PET SKULL BASE TO THIGH TECHNIQUE: 5.9 mCi F-18 FDG was injected intravenously. Full-ring PET imaging was performed from the skull base to thigh after the radiotracer. CT data was obtained and used for attenuation correction and anatomic localization. Fasting blood glucose: 76 mg/dl COMPARISON:  CTA 06/24/2019 FINDINGS: Mediastinal blood pool activity: SUV max 1.8 Liver activity: SUV max NA NECK: No hypermetabolic lymph nodes in the neck. Incidental CT findings: none CHEST: 4.0 x 2.3 x 1.9 cm left upper lobe pulmonary mass identified on recent chest CT is markedly hypermetabolic with SUV max = 31.5 hypermetabolic left hilar metastatic  demonstrates SUV max = 6.8. no evidence for hypermetabolic mediastinal or right hilar metastases. Incidental CT findings: Coronary artery calcification is evident. Atherosclerotic calcification is noted in the wall of the thoracic aorta. Scattered tiny calcified and noncalcified pulmonary nodules better evaluated on the recent diagnostic CT chest. ABDOMEN/PELVIS: 5.2 cm lesion in the inferior left liver is markedly hypermetabolic with SUV max = 40.0. A second subtle liver lesion on noncontrast CT imaging measures 2.3 cm in the anterior  right liver with SUV max = 12.5. No hypermetabolic lymphadenopathy in the abdomen or pelvis. Relatively long segment non focal uptake in the right colon is presumably physiologic with no underlying colonic mass lesion evident by CT. Incidental CT findings: There is a subtle suggestion of contour nodularity in the posterior right liver (see axial 151/3) raising the question of cirrhosis. There is abdominal aortic atherosclerosis without aneurysm. SKELETON: A large hypermetabolic lesion is identified at the level of L3, involving the right posterolateral L3 vertebral body, right pedicle, transverse process, lamina and spinous process. Subtle soft tissue component is identified in the right L3-4 foramen, right aspect of the spinal canal and in the right paraspinal muscles at this level. SUV max = 14.5 Incidental CT findings: none IMPRESSION: 1. Left upper lobe pulmonary mass is markedly hypermetabolic and is associated with hypermetabolic left hilar lymphadenopathy, large hypermetabolic liver lesions and a hypermetabolic L3 lesion that is amazingly subtle on CT imaging relative to its large size. Primary bronchogenic neoplasm with metastatic disease to the left hilum, liver, and lumbar spine would be a consideration. Given the suggestion of cirrhotic liver morphology and the liver and spinal lesions being larger than the lung lesion, primary hepatocellular carcinoma with metastatic  disease to the lung and spine would also be a consideration. 2. The large L3 lesion involves the right L3 neuroforamen and right epidural space. Lumbar spine MRI with and without contrast may be warranted to further evaluate. These results will be called to the ordering clinician or representative by the Radiologist Assistant, and communication documented in the PACS or [employer]. Electronically Signed   By: Misty Stanley M.D.   On: 07/03/2019 13:10   US Venous Img Lower Bilateral (DVT)  Result Date: 06/25/2019 CLINICAL DATA:  Bilateral lower extremity pain for the past 3 weeks. Evaluate for DVT. EXAM: BILATERAL LOWER EXTREMITY VENOUS DOPPLER ULTRASOUND TECHNIQUE: Gray-scale sonography with graded compression, as well as color Doppler and duplex ultrasound were performed to evaluate the lower extremity deep venous systems from the level of the common femoral vein and including the common femoral, femoral, profunda femoral, popliteal and calf veins including the posterior tibial, peroneal and gastrocnemius veins when visible. The superficial great saphenous vein was also interrogated. Spectral Doppler was utilized to evaluate flow at rest and with distal augmentation maneuvers in the common femoral, femoral and popliteal veins. COMPARISON:  None. FINDINGS: RIGHT LOWER EXTREMITY Common Femoral Vein: No evidence of thrombus. Normal compressibility, respiratory phasicity and response to augmentation. Saphenofemoral Junction: No evidence of thrombus. Normal compressibility and flow on color Doppler imaging. Profunda Femoral Vein: No evidence of thrombus. Normal compressibility and flow on color Doppler imaging. Femoral Vein: No evidence of thrombus. Normal compressibility, respiratory phasicity and response to augmentation. Popliteal Vein: No evidence of thrombus. Normal compressibility, respiratory phasicity and response to augmentation. Calf Veins: No evidence of thrombus. Normal compressibility and flow on  color Doppler imaging. Superficial Great Saphenous Vein: No evidence of thrombus. Normal compressibility. Venous Reflux:  None. Other Findings:  None. LEFT LOWER EXTREMITY Common Femoral Vein: No evidence of thrombus. Normal compressibility, respiratory phasicity and response to augmentation. Saphenofemoral Junction: No evidence of thrombus. Normal compressibility and flow on color Doppler imaging. Profunda Femoral Vein: No evidence of thrombus. Normal compressibility and flow on color Doppler imaging. Femoral Vein: No evidence of thrombus. Normal compressibility, respiratory phasicity and response to augmentation. Popliteal Vein: No evidence of thrombus. Normal compressibility, respiratory phasicity and response to augmentation. Calf Veins: No evidence of thrombus. Normal compressibility  and flow on color Doppler imaging. Superficial Great Saphenous Vein: No evidence of thrombus. Normal compressibility. Venous Reflux:  None. Other Findings:  None. IMPRESSION: No evidence of DVT within either lower extremity. Electronically Signed   By: Sandi Mariscal M.D.   On: 06/25/2019 16:48   DG Chest Portable 1 View  Result Date: 06/24/2019 CLINICAL DATA:  Hypoxia EXAM: PORTABLE CHEST 1 VIEW COMPARISON:  05/17/2018, 02/07/2016 FINDINGS: Hyperinflation with emphysematous disease and bronchitic changes. 2.5 cm oval opacity in the left perihilar region. Normal cardiomediastinal silhouette with aortic atherosclerosis. No pneumothorax. Apical pleural and parenchymal scarring. IMPRESSION: 1. Emphysematous disease with bronchitic changes. 2. 2.5 cm oval opacity in the left perihilar region, possibly representing a lung nodule. Chest CT is recommended for further evaluation. Electronically Signed   By: Donavan Foil M.D.   On: 06/24/2019 17:56   DG HIP UNILAT WITH PELVIS 2-3 VIEWS RIGHT  Result Date: 06/24/2019 CLINICAL DATA:  Right hip pain.  No known injury. EXAM: DG HIP (WITH OR WITHOUT PELVIS) 2-3V RIGHT COMPARISON:  None.  FINDINGS: There is diffuse osteopenia. There is no acute displaced fracture or dislocation. There is some sclerosis visualized in the transcervical region of the proximal right femur. Mild degenerative changes are noted of both hips. IMPRESSION: 1. No definite acute displaced fracture or dislocation. 2. Subtle sclerosis in the transcervical region may represent artifact. However, if there is high clinical suspicion follow-up with MRI is recommended given the presence of diffuse osteopenia. Electronically Signed   By: Constance Holster M.D.   On: 06/24/2019 20:53   DG FEMUR, MIN 2 VIEWS RIGHT  Result Date: 07/07/2019 CLINICAL DATA:  Distal right femur pain. EXAM: RIGHT FEMUR 2 VIEWS COMPARISON:  None. FINDINGS: No bone abnormality. No abnormal soft tissue finding other than some ordinary arterial calcification, less than often seen at this age. No significant knee joint pathology is seen. IMPRESSION: Negative radiography. Electronically Signed   By: Nelson Chimes M.D.   On: 07/07/2019 16:28    Labs:  CBC: Recent Labs    06/24/19 1723 06/25/19 0422 07/07/19 1027  WBC 10.6* 7.2 6.7  HGB 14.2 12.6 13.8  HCT 43.4 39.0 42.7  PLT 277 234 309    COAGS: Recent Labs    07/07/19 1027  INR 1.0  APTT 32    BMP: Recent Labs    06/24/19 1723 06/25/19 0422 06/26/19 0859 07/07/19 1027  NA 139 138 134* 138  K 4.5 4.0 4.4 3.8  CL 99 99 93* 96*  CO2 33* 33* 30 33*  GLUCOSE 114* 91 155* 117*  BUN 11 12 10 13   CALCIUM 9.5 9.0 9.5 9.8  CREATININE 0.45 0.48 0.60 0.57  GFRNONAA >60 >60 >60 >60  GFRAA >60 >60 >60 >60    LIVER FUNCTION TESTS: Recent Labs    06/24/19 1723 07/07/19 1027  BILITOT 0.6 0.7  AST 23 22  ALT 13 13  ALKPHOS 83 82  PROT 7.8 8.2*  ALBUMIN 4.4 4.5    TUMOR MARKERS: No results for input(s): AFPTM, CEA, CA199, CHROMGRNA in the last 8760 hours.  Assessment and Plan:  86 y/o F with recently noted markedly hypermetabolic left lung mass, liver lesion and L3  lesion who presents today for a liver lesion biopsy to further guide care.  Patient has been NPO since 9 pm besides a small sip of water with her AM medications, she does not take blood thinning medications. Afebrile, WBC 11.5, hgb 14.1, plt 350, INR  0.9.  Risks and  benefits of liver lesion biopsy was discussed with the patient and/or patient's family including, but not limited to bleeding, infection, damage to adjacent structures or low yield requiring additional tests.  All of the questions were answered and there is agreement to proceed.  Consent signed and in chart.  Thank you for this interesting consult.  I greatly enjoyed meeting [facility] and look forward to participating in their care.  A copy of this report was sent to the requesting provider on this date.  Electronically Signed: Joaquim Nam, PA-C 07/14/2019, 8:21 AM   I spent a total of 30 Minutes  in face to face in clinical consultation, greater than 50% of which was counseling/coordinating care for liver lesion biopsy.

## 2019-07-14 NOTE — Procedures (Signed)
Pre Procedure Dx: Liver mass Post Procedural Dx: Same  Technically successful US guided biopsy of dominant mass within the left lobe of the liver.  EBL: Trace No immediate complications.   Ronny Bacon, MD Pager #: [phone]

## 2019-07-14 NOTE — Telephone Encounter (Signed)
Dr. Patsey Berthold, please advise if you would like to hold off on scheduling the bronch. If so, what date works best for you. Thanks.  Pt is having liver biopsy today 07/14/19.

## 2019-07-14 NOTE — Telephone Encounter (Signed)
Noted Will sign off 

## 2019-07-15 ENCOUNTER — Inpatient Hospital Stay: Payer: Medicare HMO | Attending: Hospice and Palliative Medicine | Admitting: Hospice and Palliative Medicine

## 2019-07-15 DIAGNOSIS — Z515 Encounter for palliative care: Secondary | ICD-10-CM

## 2019-07-15 DIAGNOSIS — C3412 Malignant neoplasm of upper lobe, left bronchus or lung: Secondary | ICD-10-CM | POA: Insufficient documentation

## 2019-07-15 DIAGNOSIS — Z90710 Acquired absence of both cervix and uterus: Secondary | ICD-10-CM | POA: Insufficient documentation

## 2019-07-15 DIAGNOSIS — J449 Chronic obstructive pulmonary disease, unspecified: Secondary | ICD-10-CM | POA: Insufficient documentation

## 2019-07-15 DIAGNOSIS — G893 Neoplasm related pain (acute) (chronic): Secondary | ICD-10-CM | POA: Diagnosis not present

## 2019-07-15 DIAGNOSIS — Z7189 Other specified counseling: Secondary | ICD-10-CM

## 2019-07-15 DIAGNOSIS — C7951 Secondary malignant neoplasm of bone: Secondary | ICD-10-CM | POA: Insufficient documentation

## 2019-07-15 DIAGNOSIS — Z87891 Personal history of nicotine dependence: Secondary | ICD-10-CM | POA: Insufficient documentation

## 2019-07-15 DIAGNOSIS — R112 Nausea with vomiting, unspecified: Secondary | ICD-10-CM | POA: Insufficient documentation

## 2019-07-15 DIAGNOSIS — K5900 Constipation, unspecified: Secondary | ICD-10-CM | POA: Insufficient documentation

## 2019-07-15 DIAGNOSIS — C787 Secondary malignant neoplasm of liver and intrahepatic bile duct: Secondary | ICD-10-CM | POA: Insufficient documentation

## 2019-07-15 DIAGNOSIS — C7801 Secondary malignant neoplasm of right lung: Secondary | ICD-10-CM | POA: Insufficient documentation

## 2019-07-15 MED ORDER — DEXAMETHASONE 2 MG PO TABS: ORAL_TABLET | ORAL | 0 refills | Status: DC

## 2019-07-15 NOTE — Progress Notes (Signed)
[place]  Telephone:([phone] [phone]   Name: Jane Davis Date: 07/15/2019 MRN: [id number]  DOB: June 15, 1933  Patient Care Team: Leone Haven, MD as PCP - General (Family Medicine) Caryl Bis Angela Adam, MD as Consulting Physician (Family Medicine) Christene Lye, MD (General Surgery) Telford Nab, RN as Oncology Nurse Navigator Clent Jacks, RN as Oncology Nurse Navigator    REASON FOR CONSULTATION: Jane Davis is a 86 y.o. female with multiple medical problems including COPD, hypertension, and tobacco abuse, who was hospitalized 06/24/2019-06/26/2019 with COPD exacerbation.  Patient was incidentally found to have a left lung mass concerning for malignancy further work-up including PET scan revealed metastatic disease involving the lung, liver, and L3.  Patient was also found to have multiple brain metastases on MRI.  She underwent liver biopsy on 07/14/2019.  She was referred to palliative care to help address goals and manage ongoing symptoms.  SOCIAL HISTORY:     reports that she quit smoking about 3 years ago. Her smoking use included cigarettes. She has a 40.00 pack-year smoking history. She has never used smokeless tobacco. She reports that she does not drink alcohol or use drugs.   Patient is widowed for the past 7 years.  She lives in [place] with her son.  In total, she has 3 daughters and 2 sons, most of whom live out of state.  Patient lived in a variety of places as her husband was in the [employer].  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  ADVANCE DIRECTIVES:  On file -patient son [name]) and daughter Butch Penny) are her [place].  Butch Penny lives in [place], [place]  CODE STATUS:   PAST MEDICAL HISTORY: Past Medical History:  Diagnosis Date  . COPD (chronic obstructive pulmonary disease) ([place])   . Essential hypertension 03/22/2016  .  Glaucoma   . Hypertension   . Hypothyroidism   . Hypoxia 02/11/2016   Overview:  Discharged from hospital on home oxygen.  . Palpitations 03/22/2016  . Prediabetes 02/19/2015  . Rectal lesion 05/29/2016  . Urge incontinence of urine 05/29/2016    PAST SURGICAL HISTORY:  Past Surgical History:  Procedure Laterality Date  . ABDOMINAL HYSTERECTOMY    . COLONOSCOPY  2013   [place] HISTORY:  Oncology History   No history exists.    ALLERGIES:  has No Known Allergies.  MEDICATIONS:  Current Outpatient Medications  Medication Sig Dispense Refill  . albuterol (VENTOLIN HFA) 108 (90 Base) MCG/ACT inhaler Inhale 2 puffs into the lungs every 6 (six) hours as needed for wheezing or shortness of breath. 8 g 0  . aspirin EC 81 MG tablet Take 81 mg by mouth daily.    Marland Kitchen dexamethasone (DECADRON) 2 MG tablet 1 pill every 8 hours- take with meals 30 tablet 0  . HYDROcodone-acetaminophen (NORCO/VICODIN) 5-325 MG tablet Take 1 tablet by mouth every 6 (six) hours as needed for severe pain. 60 tablet 0  . latanoprost (XALATAN) 0.005 % ophthalmic solution Place 1 drop into both eyes at bedtime.     Marland Kitchen levothyroxine (SYNTHROID) 75 MCG tablet Take 1 tablet (75 mcg total) by mouth daily. 90 tablet 1  . olmesartan (BENICAR) 20 MG tablet Take 1 tablet (20 mg total) by mouth daily. 90 tablet 1  . Timolol Maleate 0.5 % (DAILY) SOLN Place 1 drop into the right eye daily.    Marland Kitchen umeclidinium-vilanterol [facility]  ELLIPTA) 62.5-25 MCG/INH AEPB Inhale 1 puff into the lungs daily. 30 each 0   No current facility-administered medications for this visit.    VITAL SIGNS: There were no vitals taken for this visit. There were no vitals filed for this visit.  Estimated body mass index is 21.09 kg/m as calculated from the following:   Height as of 07/14/19: 5' (1.524 m).   Weight as of 07/14/19: 108 lb (49 kg).  LABS: CBC:    Component Value Date/Time   WBC 11.5 (H) 07/14/2019 0808   HGB 14.1  07/14/2019 0808   HCT 43.6 07/14/2019 0808   PLT 350 07/14/2019 0808   MCV 95.2 07/14/2019 0808   NEUTROABS 5.0 07/07/2019 1027   LYMPHSABS 1.1 07/07/2019 1027   MONOABS 0.5 07/07/2019 1027   EOSABS 0.1 07/07/2019 1027   BASOSABS 0.0 07/07/2019 1027   Comprehensive Metabolic Panel:    Component Value Date/Time   NA 138 07/07/2019 1027   K 3.8 07/07/2019 1027   CL 96 (L) 07/07/2019 1027   CO2 33 (H) 07/07/2019 1027   BUN 13 07/07/2019 1027   CREATININE 0.57 07/07/2019 1027   GLUCOSE 117 (H) 07/07/2019 1027   CALCIUM 9.8 07/07/2019 1027   AST 22 07/07/2019 1027   ALT 13 07/07/2019 1027   ALKPHOS 82 07/07/2019 1027   BILITOT 0.7 07/07/2019 1027   PROT 8.2 (H) 07/07/2019 1027   ALBUMIN 4.5 07/07/2019 1027    RADIOGRAPHIC STUDIES: CT Angio Chest PE W and/or Wo Contrast  Result Date: 06/24/2019 CLINICAL DATA:  Hypoxia. Bilateral lower extremity pain. Nodule on chest radiography. EXAM: CT ANGIOGRAPHY CHEST WITH CONTRAST TECHNIQUE: Multidetector CT imaging of the chest was performed using the standard protocol during bolus administration of intravenous contrast. Multiplanar CT image reconstructions and MIPs were obtained to evaluate the vascular anatomy. CONTRAST:  83m OMNIPAQUE IOHEXOL 350 MG/ML SOLN COMPARISON:  Chest radiography same day and 05/17/2018 FINDINGS: Cardiovascular: There are no pulmonary emboli. Heart size is normal. There is coronary artery calcification. There is aortic atherosclerosis. Mediastinum/Nodes: There is left hilar lymphadenopathy with the largest node measuring 19 mm in diameter. Lungs/Pleura: There is pleural and parenchymal scarring, most pronounced at the lung apices. 4.0 x 2.3 x 1.9 cm mass in the lingula worrisome for lung carcinoma. Left hilar lymphadenopathy as described above similarly worrisome. Upper Abdomen: Negative Musculoskeletal: Ordinary thoracic degenerative changes. Review of the MIP images confirms the above findings. IMPRESSION: No pulmonary  emboli. 4.0 x 2.3 x 1.9 cm mass in the lingula worrisome for lung carcinoma. Left hilar lymphadenopathy. Aortic aneurysm NOS (ICD10-I71.9). Electronically Signed   By: MNelson ChimesM.D.   On: 06/24/2019 19:15   MR BRAIN W WO CONTRAST  Result Date: 07/11/2019 CLINICAL DATA:  New diagnosis lung mass.  Staging. EXAM: MRI HEAD WITHOUT AND WITH CONTRAST TECHNIQUE: Multiplanar, multiecho pulse sequences of the brain and surrounding structures were obtained without and with intravenous contrast. CONTRAST:  562mGADAVIST GADOBUTROL 1 MMOL/ML IV SOLN COMPARISON:  None. FINDINGS: Brain: There are multiple intracranial metastatic lesions. The majority of these show poor contrast enhancement in are actually better seen on the diffusion imaging as foci of restricted diffusion. Lesions are identified using a combination of axial and coronal diffusion imaging as well as the postcontrast imaging. Lesions are as follows. 2 mm metastasis inferior medial right cerebellum. 6-7 mm metastasis left ventral pons. Question of a punctate metastasis at the cerebellar vermian tip. 2 mm metastasis in the right anterior thalamus. 2 mm metastasis in  the left posterior limb internal capsule. 3 mm metastasis at the medial right frontal lobe. 5 mm metastasis of the right parietal cortex. 7 mm metastasis at the right frontal cortex at the vertex. 3 mm metastasis of the left frontal cortex at the vertex. 5 mm metastasis of the inferior temporal lobe on the right. 4 mm metastasis lateral surface of the left temporal lobe. 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 Cerebral hemispheres show mild to moderate chronic small-vessel changes of the white matter considering age. No hydrocephalus. No extra-axial collection. 4 mm metastasis of the inferior frontal lobe on the left. Vascular: Major vessels at the base of the brain show flow. Skull and upper cervical spine: No bone metastases identified. Sinuses/Orbits: Clear/normal Other:  None IMPRESSION: At least 11 metastases scattered throughout the infratentorial and supratentorial brain as outlined above. Lesions show poor or no enhancement and are actually better seen on the diffusion imaging, therefore more difficult than usual to localize and documented, requiring utilization of both the axial and coronal diffusion imaging as well as the postcontrast T1 imaging. None of the lesions is large or associated with mass effect, hemorrhage or vasogenic edema. Electronically Signed   By: Nelson Chimes M.D.   On: 07/11/2019 13:10   MR Lumbar Spine W Wo Contrast  Result Date: 07/08/2019 CLINICAL DATA:  Left upper lobe pulmonary mass. EXAM: MRI LUMBAR SPINE WITHOUT AND WITH CONTRAST TECHNIQUE: Multiplanar and multiecho pulse sequences of the lumbar spine were obtained without and with intravenous contrast. CONTRAST:  31m GADAVIST GADOBUTROL 1 MMOL/ML IV SOLN COMPARISON:  PET scan 06/05/2019 FINDINGS: Segmentation: 5 non rib-bearing lumbar type vertebral bodies are present. The lowest fully formed vertebral body is L5. Alignment: No significant listhesis is present. Rightward curvature is centered at L3. Vertebrae: Infiltrative mass is again noted at L3. This involves the right side of the vertebral body. There is diffuse infiltration of the posterior elements including the spinous process. Tumor is more prominent right than left. Tumor extends into the epidural space and fills the right neural foramen at L3-4. Enhancing tumor mass measures 5.0 x 2.7 on axial images. Epidural extension measures cephalo caudad 3.2 cm, predominantly on the right. No other focal enhancement is present to suggest tumor elsewhere. Chronic endplate marrow changes are noted. Conus medullaris and cauda equina: Conus extends to the L1 level. Conus and cauda equina appear normal. Paraspinal and other soft tissues: A 1.4 cm simple cyst is present anteriorly in the right kidney. Solid organs are otherwise within normal limits. No  significant adenopathy is present. Disc levels: L1-2: Negative. L2-3: Negative. L3-4: A broad-based disc protrusion is present. There is some tumor crow chin into the right neural foramen. The left neural foramen is patent. Tumor impacts the right subarticular recess is well. L4-5: Chronic loss of disc height is present. Mild broad-based bulging is present. Mild facet hypertrophy is noted bilaterally. No focal stenosis is evident. L5-S1: Asymmetric left-sided facet hypertrophy is noted. A broad-based disc protrusion is present. This results in mild left foraminal narrowing. IMPRESSION: 1. Infiltrative mass lesion involving the right side of the L3 vertebral body and posterior elements likely represents metastatic disease from the lung or liver primary. 2. Tumor impacts the right subarticular recess and right neural foramen at L3-4. 3. Mild left foraminal narrowing at L5-S1. 4. Mild broad-based disc bulging and facet hypertrophy at L4-5 without significant stenosis. Electronically Signed   By: CSan MorelleM.D.   On: 07/08/2019 12:28   NM  PET Image Initial (PI) Skull Base To Thigh  Result Date: 07/03/2019 CLINICAL DATA:  Initial treatment strategy for left upper lobe pulmonary mass. EXAM: NUCLEAR MEDICINE PET SKULL BASE TO THIGH TECHNIQUE: 5.9 mCi F-18 FDG was injected intravenously. Full-ring PET imaging was performed from the skull base to thigh after the radiotracer. CT data was obtained and used for attenuation correction and anatomic localization. Fasting blood glucose: 76 mg/dl COMPARISON:  CTA 06/24/2019 FINDINGS: Mediastinal blood pool activity: SUV max 1.8 Liver activity: SUV max NA NECK: No hypermetabolic lymph nodes in the neck. Incidental CT findings: none CHEST: 4.0 x 2.3 x 1.9 cm left upper lobe pulmonary mass identified on recent chest CT is markedly hypermetabolic with SUV max = 82.9 hypermetabolic left hilar metastatic demonstrates SUV max = 6.8. no evidence for hypermetabolic mediastinal  or right hilar metastases. Incidental CT findings: Coronary artery calcification is evident. Atherosclerotic calcification is noted in the wall of the thoracic aorta. Scattered tiny calcified and noncalcified pulmonary nodules better evaluated on the recent diagnostic CT chest. ABDOMEN/PELVIS: 5.2 cm lesion in the inferior left liver is markedly hypermetabolic with SUV max = 93.7. A second subtle liver lesion on noncontrast CT imaging measures 2.3 cm in the anterior right liver with SUV max = 12.5. No hypermetabolic lymphadenopathy in the abdomen or pelvis. Relatively long segment non focal uptake in the right colon is presumably physiologic with no underlying colonic mass lesion evident by CT. Incidental CT findings: There is a subtle suggestion of contour nodularity in the posterior right liver (see axial 151/3) raising the question of cirrhosis. There is abdominal aortic atherosclerosis without aneurysm. SKELETON: A large hypermetabolic lesion is identified at the level of L3, involving the right posterolateral L3 vertebral body, right pedicle, transverse process, lamina and spinous process. Subtle soft tissue component is identified in the right L3-4 foramen, right aspect of the spinal canal and in the right paraspinal muscles at this level. SUV max = 14.5 Incidental CT findings: none IMPRESSION: 1. Left upper lobe pulmonary mass is markedly hypermetabolic and is associated with hypermetabolic left hilar lymphadenopathy, large hypermetabolic liver lesions and a hypermetabolic L3 lesion that is amazingly subtle on CT imaging relative to its large size. Primary bronchogenic neoplasm with metastatic disease to the left hilum, liver, and lumbar spine would be a consideration. Given the suggestion of cirrhotic liver morphology and the liver and spinal lesions being larger than the lung lesion, primary hepatocellular carcinoma with metastatic disease to the lung and spine would also be a consideration. 2. The large L3  lesion involves the right L3 neuroforamen and right epidural space. Lumbar spine MRI with and without contrast may be warranted to further evaluate. These results will be called to the ordering clinician or representative by the Radiologist Assistant, and communication documented in the PACS or [employer]. Electronically Signed   By: Misty Stanley M.D.   On: 07/03/2019 13:10   US BIOPSY (LIVER)  Result Date: 07/14/2019 INDICATION: No known primary, now with hypermetabolic liver lesions worrisome for metastatic disease. Please from ultrasound-guided biopsy for tissue diagnostic purposes. EXAM: ULTRASOUND GUIDED LIVER LESION BIOPSY COMPARISON:  PET-CT-07/03/2019 MEDICATIONS: None ANESTHESIA/SEDATION: Fentanyl 50 mcg IV; Versed 1 mg IV Total Moderate Sedation time:  10 Minutes. The patient's level of consciousness and vital signs were monitored continuously by radiology nursing throughout the procedure under my direct supervision. COMPLICATIONS: None immediate. PROCEDURE: Informed written consent was obtained from the patient after a discussion of the risks, benefits and alternatives to treatment. The patient  understands and consents the procedure. A timeout was performed prior to the initiation of the procedure. Ultrasound scanning was performed of the right upper abdominal quadrant demonstrates an approximately 5.6 x 5.5 cm isoechoic mass within the left lobe of the liver correlating with the dominant hypermetabolic lesion seen on preceding PET-CT image 143, series 3. The procedure was planned. The midline of the abdomen was prepped and draped in the usual sterile fashion. The overlying soft tissues were anesthetized with 1% lidocaine with epinephrine. A 17 gauge, 6.8 cm co-axial needle was advanced into a peripheral aspect of the lesion. This was followed by 5 core biopsies with an 18 gauge core device under direct ultrasound guidance. The coaxial needle tract was embolized with a small amount of Gel-Foam  slurry and superficial hemostasis was obtained with manual compression. Post procedural scanning was negative for definitive area of hemorrhage or additional complication. A dressing was placed. The patient tolerated the procedure well without immediate post procedural complication. IMPRESSION: Technically successful ultrasound guided core needle biopsy of dominant hypermetabolic mass within the left lobe of the liver. Electronically Signed   By: Sandi Mariscal M.D.   On: 07/14/2019 10:04   US Venous Img Lower Bilateral (DVT)  Result Date: 06/25/2019 CLINICAL DATA:  Bilateral lower extremity pain for the past 3 weeks. Evaluate for DVT. EXAM: BILATERAL LOWER EXTREMITY VENOUS DOPPLER ULTRASOUND TECHNIQUE: Gray-scale sonography with graded compression, as well as color Doppler and duplex ultrasound were performed to evaluate the lower extremity deep venous systems from the level of the common femoral vein and including the common femoral, femoral, profunda femoral, popliteal and calf veins including the posterior tibial, peroneal and gastrocnemius veins when visible. The superficial great saphenous vein was also interrogated. Spectral Doppler was utilized to evaluate flow at rest and with distal augmentation maneuvers in the common femoral, femoral and popliteal veins. COMPARISON:  None. FINDINGS: RIGHT LOWER EXTREMITY Common Femoral Vein: No evidence of thrombus. Normal compressibility, respiratory phasicity and response to augmentation. Saphenofemoral Junction: No evidence of thrombus. Normal compressibility and flow on color Doppler imaging. Profunda Femoral Vein: No evidence of thrombus. Normal compressibility and flow on color Doppler imaging. Femoral Vein: No evidence of thrombus. Normal compressibility, respiratory phasicity and response to augmentation. Popliteal Vein: No evidence of thrombus. Normal compressibility, respiratory phasicity and response to augmentation. Calf Veins: No evidence of thrombus.  Normal compressibility and flow on color Doppler imaging. Superficial Great Saphenous Vein: No evidence of thrombus. Normal compressibility. Venous Reflux:  None. Other Findings:  None. LEFT LOWER EXTREMITY Common Femoral Vein: No evidence of thrombus. Normal compressibility, respiratory phasicity and response to augmentation. Saphenofemoral Junction: No evidence of thrombus. Normal compressibility and flow on color Doppler imaging. Profunda Femoral Vein: No evidence of thrombus. Normal compressibility and flow on color Doppler imaging. Femoral Vein: No evidence of thrombus. Normal compressibility, respiratory phasicity and response to augmentation. Popliteal Vein: No evidence of thrombus. Normal compressibility, respiratory phasicity and response to augmentation. Calf Veins: No evidence of thrombus. Normal compressibility and flow on color Doppler imaging. Superficial Great Saphenous Vein: No evidence of thrombus. Normal compressibility. Venous Reflux:  None. Other Findings:  None. IMPRESSION: No evidence of DVT within either lower extremity. Electronically Signed   By: Sandi Mariscal M.D.   On: 06/25/2019 16:48   DG Chest Portable 1 View  Result Date: 06/24/2019 CLINICAL DATA:  Hypoxia EXAM: PORTABLE CHEST 1 VIEW COMPARISON:  05/17/2018, 02/07/2016 FINDINGS: Hyperinflation with emphysematous disease and bronchitic changes. 2.5 cm oval opacity in the left  perihilar region. Normal cardiomediastinal silhouette with aortic atherosclerosis. No pneumothorax. Apical pleural and parenchymal scarring. IMPRESSION: 1. Emphysematous disease with bronchitic changes. 2. 2.5 cm oval opacity in the left perihilar region, possibly representing a lung nodule. Chest CT is recommended for further evaluation. Electronically Signed   By: Donavan Foil M.D.   On: 06/24/2019 17:56   DG HIP UNILAT WITH PELVIS 2-3 VIEWS RIGHT  Result Date: 06/24/2019 CLINICAL DATA:  Right hip pain.  No known injury. EXAM: DG HIP (WITH OR WITHOUT  PELVIS) 2-3V RIGHT COMPARISON:  None. FINDINGS: There is diffuse osteopenia. There is no acute displaced fracture or dislocation. There is some sclerosis visualized in the transcervical region of the proximal right femur. Mild degenerative changes are noted of both hips. IMPRESSION: 1. No definite acute displaced fracture or dislocation. 2. Subtle sclerosis in the transcervical region may represent artifact. However, if there is high clinical suspicion follow-up with MRI is recommended given the presence of diffuse osteopenia. Electronically Signed   By: Constance Holster M.D.   On: 06/24/2019 20:53   DG FEMUR, MIN 2 VIEWS RIGHT  Result Date: 07/07/2019 CLINICAL DATA:  Distal right femur pain. EXAM: RIGHT FEMUR 2 VIEWS COMPARISON:  None. FINDINGS: No bone abnormality. No abnormal soft tissue finding other than some ordinary arterial calcification, less than often seen at this age. No significant knee joint pathology is seen. IMPRESSION: Negative radiography. Electronically Signed   By: Nelson Chimes M.D.   On: 07/07/2019 16:28    PERFORMANCE STATUS (ECOG) : 1 - Symptomatic but completely ambulatory  Review of Systems Unless otherwise noted, a complete review of systems is negative.  Physical Exam Deferred due to virtual nature of the visit  IMPRESSION: I met virtually with patient, son, and daughter.  Introduced palliative care services and attempted to establish therapeutic rapport.  Symptomatically, patient says that she is doing better.  She was having moderate to severe back pain but says that this has improved after initiation of dexamethasone and with 3 times daily dosing of Norco.  Patient does have some tingling in her lower extremities, which could reflect neuropathy from spinal lesions.  She is scheduled to see Dr. Baruch Gouty on Thursday for consideration of RT.  We did review the results of the recent brain MRI.  Again, patient will see Dr. Baruch Gouty on Thursday and may benefit from whole  brain radiation.  We will continue dexamethasone for now.  At baseline, patient lives at home with her son.  She reports being functionally independent with her care and still drives.  She cooked breakfast for her children this morning.  Patient and family are interested in speaking with Dr. Rogue Bussing about treatment options for the cancer.   Patient has advanced directives on file.  I introduced the concept of a MOST form and we will try to ensure the patient receives one to take home when she comes in to the clinic on Thursday.  We can have a more thorough conversation after she is able to review this document.  PLAN: -Biopsy results pending.  Patient will RTC on 4/8 to see Dr. Rogue Bussing and Dr. Baruch Gouty -Continue as needed Norco -Discussed bowel regimen -Continue dexamethasone (will refill today) -RTC when patient returns to see Dr. Rogue Bussing   Patient expressed understanding and was in agreement with this plan. She also understands that She can call the clinic at any time with any questions, concerns, or complaints.     Time Total: 20 minutes  Visit consisted of counseling and  education dealing with the complex and emotionally intense issues of symptom management and palliative care in the setting of serious and potentially life-threatening illness.Greater than 50%  of this time was spent counseling and coordinating care related to the above assessment and plan.  Signed by: Altha Harm, PhD, NP-C

## 2019-07-16 ENCOUNTER — Encounter: Payer: Self-pay | Admitting: Family Medicine

## 2019-07-16 ENCOUNTER — Encounter: Payer: Self-pay | Admitting: Internal Medicine

## 2019-07-16 LAB — SURGICAL PATHOLOGY

## 2019-07-17 ENCOUNTER — Encounter: Payer: Self-pay | Admitting: Pulmonary Disease

## 2019-07-17 ENCOUNTER — Inpatient Hospital Stay (HOSPITAL_BASED_OUTPATIENT_CLINIC_OR_DEPARTMENT_OTHER): Payer: Medicare HMO | Admitting: Internal Medicine

## 2019-07-17 ENCOUNTER — Encounter: Payer: Self-pay | Admitting: *Deleted

## 2019-07-17 ENCOUNTER — Other Ambulatory Visit: Payer: Self-pay | Admitting: Internal Medicine

## 2019-07-17 ENCOUNTER — Other Ambulatory Visit: Payer: Self-pay

## 2019-07-17 ENCOUNTER — Ambulatory Visit: Payer: Medicare HMO | Admitting: Pulmonary Disease

## 2019-07-17 ENCOUNTER — Ambulatory Visit
Admission: RE | Admit: 2019-07-17 | Discharge: 2019-07-17 | Disposition: A | Discharge status: Home / Self Care | Payer: Medicare HMO | Source: Ambulatory Visit | Attending: Radiation Oncology | Admitting: Radiation Oncology

## 2019-07-17 ENCOUNTER — Inpatient Hospital Stay: Payer: Medicare HMO

## 2019-07-17 ENCOUNTER — Telehealth: Payer: Self-pay | Admitting: Internal Medicine

## 2019-07-17 ENCOUNTER — Encounter: Payer: Self-pay | Admitting: Radiation Oncology

## 2019-07-17 VITALS — BP 130/54 | HR 61 | Temp 97.5°F | Resp 16 | Wt 107.0 lb

## 2019-07-17 VITALS — BP 112/50 | HR 61 | Temp 97.5°F | Ht 60.0 in | Wt 107.6 lb

## 2019-07-17 DIAGNOSIS — J9611 Chronic respiratory failure with hypoxia: Secondary | ICD-10-CM | POA: Diagnosis not present

## 2019-07-17 DIAGNOSIS — C7951 Secondary malignant neoplasm of bone: Secondary | ICD-10-CM

## 2019-07-17 DIAGNOSIS — C787 Secondary malignant neoplasm of liver and intrahepatic bile duct: Secondary | ICD-10-CM | POA: Insufficient documentation

## 2019-07-17 DIAGNOSIS — Z7982 Long term (current) use of aspirin: Secondary | ICD-10-CM | POA: Insufficient documentation

## 2019-07-17 DIAGNOSIS — Z510 Encounter for antineoplastic radiation therapy: Secondary | ICD-10-CM | POA: Insufficient documentation

## 2019-07-17 DIAGNOSIS — C7931 Secondary malignant neoplasm of brain: Secondary | ICD-10-CM | POA: Diagnosis not present

## 2019-07-17 DIAGNOSIS — M549 Dorsalgia, unspecified: Secondary | ICD-10-CM | POA: Diagnosis not present

## 2019-07-17 DIAGNOSIS — C3490 Malignant neoplasm of unspecified part of unspecified bronchus or lung: Secondary | ICD-10-CM

## 2019-07-17 DIAGNOSIS — Z87891 Personal history of nicotine dependence: Secondary | ICD-10-CM | POA: Insufficient documentation

## 2019-07-17 DIAGNOSIS — E039 Hypothyroidism, unspecified: Secondary | ICD-10-CM | POA: Diagnosis not present

## 2019-07-17 DIAGNOSIS — C3412 Malignant neoplasm of upper lobe, left bronchus or lung: Secondary | ICD-10-CM | POA: Insufficient documentation

## 2019-07-17 DIAGNOSIS — J449 Chronic obstructive pulmonary disease, unspecified: Secondary | ICD-10-CM

## 2019-07-17 DIAGNOSIS — I10 Essential (primary) hypertension: Secondary | ICD-10-CM | POA: Insufficient documentation

## 2019-07-17 DIAGNOSIS — Z79899 Other long term (current) drug therapy: Secondary | ICD-10-CM | POA: Insufficient documentation

## 2019-07-17 NOTE — Patient Instructions (Signed)
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  Please do not hesitate to call if you have any issues with your breathing or your oxygen levels.   Continue using your nebulizer and your oxygen as needed.  I recommend you also use your oxygen at nighttime.

## 2019-07-17 NOTE — Progress Notes (Signed)
[name]  Patient Care Team: Leone Haven, MD as PCP - General (Family Medicine) Caryl Bis Angela Adam, MD as Consulting Physician (Family Medicine) Christene Lye, MD (General Surgery) Telford Nab, RN as Oncology Nurse Navigator Clent Jacks, RN as Oncology Nurse Navigator  CHIEF COMPLAINTS/PURPOSE OF CONSULTATION: lung cancer   Oncology History Overview Note  # MARCH 2021-CT scan [incidental-hospital admission for COPD exacerbation] PET scan-2.5 cm left lingular lesion; hilar adenopathy; 5.2 cm; 2.5 cm-hypermetabolic masses in the liver/cirrhotic; L3 right pedicle lesion; PET scan-left upper lobe nodule; mediastinal hilar adenopathy; liver lesion; bone lesions.  July 11, 2019-MRI brain multiple brain lesions.  April 6-liver biopsy- SMALL CELL lung cancer.   #COPD [Dr. Gonzalez]/smoker  # NGS/MOLECULAR TESTS:    # PALLIATIVE CARE EVALUATION: Josh; 4/07.   # PAIN MANAGEMENT:    DIAGNOSIS: Small cell lung cancer  STAGE:IV         ;  GOALS: Palliative  CURRENT/MOST RECENT THERAPY :     Cancer of upper lobe of left lung ([place])  07/17/2019 Initial Diagnosis   Cancer of upper lobe of left lung ([facility])      HISTORY OF PRESENTING ILLNESS:  Jane Davis 86 y.o.  female  longstanding history of smoking-with PET scan suggestive of widespread malignancy-is here to review the results of the liver biopsy/and also brain MRI.  Patient has met with radiation oncology this morning.  Patient states her pain in the hips is slightly improved.  She is currently on steroids and hydrocodone.  She denies any headaches at this time.   Review of Systems  Constitutional: Positive for malaise/fatigue and weight loss. Negative for chills, diaphoresis and fever.  HENT: Negative for nosebleeds and sore throat.   Eyes: Negative for double vision.  Respiratory: Positive for cough, sputum production and shortness of breath. Negative for  wheezing.   Cardiovascular: Negative for chest pain, palpitations, orthopnea and leg swelling.  Gastrointestinal: Negative for abdominal pain, blood in stool, constipation, diarrhea, heartburn, melena, nausea and vomiting.  Genitourinary: Negative for dysuria, frequency and urgency.  Musculoskeletal: Positive for joint pain. Negative for back pain.  Skin: Negative.  Negative for itching and rash.  Neurological: Negative for dizziness, tingling, focal weakness, weakness and headaches.  Endo/Heme/Allergies: Does not bruise/bleed easily.  Psychiatric/Behavioral: Negative for depression. The patient is not nervous/anxious and does not have insomnia.      MEDICAL HISTORY:  Past Medical History:  Diagnosis Date  . COPD (chronic obstructive pulmonary disease) ([place])   . Essential hypertension 03/22/2016  . Glaucoma   . Hypertension   . Hypothyroidism   . Hypoxia 02/11/2016   Overview:  Discharged from hospital on home oxygen.  . Palpitations 03/22/2016  . Prediabetes 02/19/2015  . Rectal lesion 05/29/2016  . Urge incontinence of urine 05/29/2016    SURGICAL HISTORY: Past Surgical History:  Procedure Laterality Date  . ABDOMINAL HYSTERECTOMY    . COLONOSCOPY  2013   [place]     SOCIAL HISTORY: Social History   Socioeconomic History  . Marital status: Widowed    Spouse name: Not on file  . Number of children: Not on file  . Years of education: Not on file  . Highest education level: Not on file  Occupational History  . Not on file  Tobacco Use  . Smoking status: Former Smoker    Packs/day: 1.00    Years: 40.00    Pack years: 40.00    Types: Cigarettes    Quit  date: 01/11/2016    Years since quitting: 3.5  . Smokeless tobacco: Never Used  Substance and Sexual Activity  . Alcohol use: No  . Drug use: No  . Sexual activity: Never  Other Topics Concern  . Not on file  Social History Narrative   Lives with son; in [place]. Hx of smoking [quit 3 years ago]; no heavy  alcohol; home maker.    Social Determinants of Health   Financial Resource Strain:   . Difficulty of Paying Living Expenses:   Food Insecurity:   . Worried About Charity fundraiser in the Last Year:   . Arboriculturist in the Last Year:   Transportation Needs:   . Film/video editor (Medical):   Marland Kitchen Lack of Transportation (Non-Medical):   Physical Activity:   . Days of Exercise per Week:   . Minutes of Exercise per Session:   Stress:   . Feeling of Stress :   Social Connections:   . Frequency of Communication with Friends and Family:   . Frequency of Social Gatherings with Friends and Family:   . Attends Religious Services:   . Active Member of Clubs or Organizations:   . Attends Archivist Meetings:   Marland Kitchen Marital Status:   Intimate Partner Violence:   . Fear of Current or Ex-Partner:   . Emotionally Abused:   Marland Kitchen Physically Abused:   . Sexually Abused:     FAMILY HISTORY: Family History  Problem Relation Age of Onset  . Intracerebral hemorrhage Mother   . Cancer Sister        breast  . Cancer Brother        colon; liver  . Bladder Cancer Neg Hx   . Prostate cancer Neg Hx     ALLERGIES:  has No Known Allergies.  MEDICATIONS:  Current Outpatient Medications  Medication Sig Dispense Refill  . albuterol (VENTOLIN HFA) 108 (90 Base) MCG/ACT inhaler Inhale 2 puffs into the lungs every 6 (six) hours as needed for wheezing or shortness of breath. 8 g 0  . aspirin EC 81 MG tablet Take 81 mg by mouth daily.    Marland Kitchen dexamethasone (DECADRON) 2 MG tablet 1 pill every 8 hours- take with meals 30 tablet 0  . HYDROcodone-acetaminophen (NORCO/VICODIN) 5-325 MG tablet Take 1 tablet by mouth every 6 (six) hours as needed for severe pain. 60 tablet 0  . latanoprost (XALATAN) 0.005 % ophthalmic solution Place 1 drop into both eyes at bedtime.     Marland Kitchen levothyroxine (SYNTHROID) 75 MCG tablet Take 1 tablet (75 mcg total) by mouth daily. 90 tablet 1  . olmesartan (BENICAR) 20 MG  tablet Take 1 tablet (20 mg total) by mouth daily. 90 tablet 1  . Timolol Maleate 0.5 % (DAILY) SOLN Place 1 drop into the right eye daily.    Marland Kitchen umeclidinium-vilanterol (ANORO ELLIPTA) 62.5-25 MCG/INH AEPB Inhale 1 puff into the lungs daily. 30 each 0   No current facility-administered medications for this visit.      Marland Kitchen  PHYSICAL EXAMINATION: ECOG PERFORMANCE STATUS: 1 - Symptomatic but completely ambulatory  Vitals:   07/17/19 1013  BP: (!) 130/54  Pulse: 61  Resp: 16  Temp: (!) 97.5 F (36.4 C)   There were no vitals filed for this visit.  Physical Exam  Constitutional: She is oriented to person, place, and time and well-developed, well-nourished, and in no distress.  HENT:  Head: Normocephalic and atraumatic.  Mouth/Throat: Oropharynx is clear and moist.  No oropharyngeal exudate.  Eyes: Pupils are equal, round, and reactive to light.  Cardiovascular: Normal rate and regular rhythm.  Pulmonary/Chest: No respiratory distress. She has no wheezes.  Decreased air entry bilaterally.  No wheeze or crackles.  Abdominal: Soft. Bowel sounds are normal. She exhibits no distension and no mass. There is no abdominal tenderness. There is no rebound and no guarding.  Musculoskeletal:        General: No tenderness or edema. Normal range of motion.     Cervical back: Normal range of motion and neck supple.  Neurological: She is alert and oriented to person, place, and time.  Skin: Skin is warm.  Psychiatric: Affect normal.     LABORATORY DATA:  I have reviewed the data as listed Lab Results  Component Value Date   WBC 11.5 (H) 07/14/2019   HGB 14.1 07/14/2019   HCT 43.6 07/14/2019   MCV 95.2 07/14/2019   PLT 350 07/14/2019   Recent Labs    06/24/19 1723 06/24/19 1723 06/25/19 0422 06/26/19 0859 07/07/19 1027  NA 139   < > 138 134* 138  K 4.5   < > 4.0 4.4 3.8  CL 99   < > 99 93* 96*  CO2 33*   < > 33* 30 33*  GLUCOSE 114*   < > 91 155* 117*  BUN 11   < > _0 CREATININE 0.45   < > 0.48 0.60 0.57  CALCIUM 9.5   < > 9.0 9.5 9.8  GFRNONAA >60   < > >60 >60 >60  GFRAA >60   < > >60 >60 >60  PROT 7.8  --   --   --  8.2*  ALBUMIN 4.4  --   --   --  4.5  AST 23  --   --   --  22  ALT 13  --   --   --  13  ALKPHOS 83  --   --   --  82  BILITOT 0.6  --   --   --  0.7   < > = values in this interval not displayed.    RADIOGRAPHIC STUDIES: I have personally reviewed the radiological images as listed and agreed with the findings in the report. CT Angio Chest PE W and/or Wo Contrast  Result Date: 06/24/2019 CLINICAL DATA:  Hypoxia. Bilateral lower extremity pain. Nodule on chest radiography. EXAM: CT ANGIOGRAPHY CHEST WITH CONTRAST TECHNIQUE: Multidetector CT imaging of the chest was performed using the standard protocol during bolus administration of intravenous contrast. Multiplanar CT image reconstructions and MIPs were obtained to evaluate the vascular anatomy. CONTRAST:  67m OMNIPAQUE IOHEXOL 350 MG/ML SOLN COMPARISON:  Chest radiography same day and 05/17/2018 FINDINGS: Cardiovascular: There are no pulmonary emboli. Heart size is normal. There is coronary artery calcification. There is aortic atherosclerosis. Mediastinum/Nodes: There is left hilar lymphadenopathy with the largest node measuring 19 mm in diameter. Lungs/Pleura: There is pleural and parenchymal scarring, most pronounced at the lung apices. 4.0 x 2.3 x 1.9 cm mass in the lingula worrisome for lung carcinoma. Left hilar lymphadenopathy as described above similarly worrisome. Upper Abdomen: Negative Musculoskeletal: Ordinary thoracic degenerative changes. Review of the MIP images confirms the above findings. IMPRESSION: No pulmonary emboli. 4.0 x 2.3 x 1.9 cm mass in the lingula worrisome for lung carcinoma. Left hilar lymphadenopathy. Aortic aneurysm NOS (ICD10-I71.9). Electronically Signed   By: MNelson ChimesM.D.   On: 06/24/2019 19:15   MR BRAIN  W WO CONTRAST  Result Date:  07/11/2019 CLINICAL DATA:  New diagnosis lung mass.  Staging. EXAM: MRI HEAD WITHOUT AND WITH CONTRAST TECHNIQUE: Multiplanar, multiecho pulse sequences of the brain and surrounding structures were obtained without and with intravenous contrast. CONTRAST:  49m GADAVIST GADOBUTROL 1 MMOL/ML IV SOLN COMPARISON:  None. FINDINGS: Brain: There are multiple intracranial metastatic lesions. The majority of these show poor contrast enhancement in are actually better seen on the diffusion imaging as foci of restricted diffusion. Lesions are identified using a combination of axial and coronal diffusion imaging as well as the postcontrast imaging. Lesions are as follows. 2 mm metastasis inferior medial right cerebellum. 6-7 mm metastasis left ventral pons. Question of a punctate metastasis at the cerebellar vermian tip. 2 mm metastasis in the right anterior thalamus. 2 mm metastasis in the left posterior limb internal capsule. 3 mm metastasis at the medial right frontal lobe. 5 mm metastasis of the right parietal cortex. 7 mm metastasis at the right frontal cortex at the vertex. 3 mm metastasis of the left frontal cortex at the vertex. 5 mm metastasis of the inferior temporal lobe on the right. 4 mm metastasis lateral surface of the left temporal lobe. 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 Cerebral hemispheres show mild to moderate chronic small-vessel changes of the white matter considering age. No hydrocephalus. No extra-axial collection. 4 mm metastasis of the inferior frontal lobe on the left. Vascular: Major vessels at the base of the brain show flow. Skull and upper cervical spine: No bone metastases identified. Sinuses/Orbits: Clear/normal Other: None IMPRESSION: At least 11 metastases scattered throughout the infratentorial and supratentorial brain as outlined above. Lesions show poor or no enhancement and are actually better seen on the diffusion imaging, therefore more difficult than usual to  localize and documented, requiring utilization of both the axial and coronal diffusion imaging as well as the postcontrast T1 imaging. None of the lesions is large or associated with mass effect, hemorrhage or vasogenic edema. Electronically Signed   By: MNelson ChimesM.D.   On: 07/11/2019 13:10   MR Lumbar Spine W Wo Contrast  Result Date: 07/08/2019 CLINICAL DATA:  Left upper lobe pulmonary mass. EXAM: MRI LUMBAR SPINE WITHOUT AND WITH CONTRAST TECHNIQUE: Multiplanar and multiecho pulse sequences of the lumbar spine were obtained without and with intravenous contrast. CONTRAST:  425mGADAVIST GADOBUTROL 1 MMOL/ML IV SOLN COMPARISON:  PET scan 06/05/2019 FINDINGS: Segmentation: 5 non rib-bearing lumbar type vertebral bodies are present. The lowest fully formed vertebral body is L5. Alignment: No significant listhesis is present. Rightward curvature is centered at L3. Vertebrae: Infiltrative mass is again noted at L3. This involves the right side of the vertebral body. There is diffuse infiltration of the posterior elements including the spinous process. Tumor is more prominent right than left. Tumor extends into the epidural space and fills the right neural foramen at L3-4. Enhancing tumor mass measures 5.0 x 2.7 on axial images. Epidural extension measures cephalo caudad 3.2 cm, predominantly on the right. No other focal enhancement is present to suggest tumor elsewhere. Chronic endplate marrow changes are noted. Conus medullaris and cauda equina: Conus extends to the L1 level. Conus and cauda equina appear normal. Paraspinal and other soft tissues: A 1.4 cm simple cyst is present anteriorly in the right kidney. Solid organs are otherwise within normal limits. No significant adenopathy is present. Disc levels: L1-2: Negative. L2-3: Negative. L3-4: A broad-based disc protrusion is present. There is some tumor crow chin  into the right neural foramen. The left neural foramen is patent. Tumor impacts the right  subarticular recess is well. L4-5: Chronic loss of disc height is present. Mild broad-based bulging is present. Mild facet hypertrophy is noted bilaterally. No focal stenosis is evident. L5-S1: Asymmetric left-sided facet hypertrophy is noted. A broad-based disc protrusion is present. This results in mild left foraminal narrowing. IMPRESSION: 1. Infiltrative mass lesion involving the right side of the L3 vertebral body and posterior elements likely represents metastatic disease from the lung or liver primary. 2. Tumor impacts the right subarticular recess and right neural foramen at L3-4. 3. Mild left foraminal narrowing at L5-S1. 4. Mild broad-based disc bulging and facet hypertrophy at L4-5 without significant stenosis. Electronically Signed   By: San Morelle M.D.   On: 07/08/2019 12:28   NM PET Image Initial (PI) Skull Base To Thigh  Result Date: 07/03/2019 CLINICAL DATA:  Initial treatment strategy for left upper lobe pulmonary mass. EXAM: NUCLEAR MEDICINE PET SKULL BASE TO THIGH TECHNIQUE: 5.9 mCi F-18 FDG was injected intravenously. Full-ring PET imaging was performed from the skull base to thigh after the radiotracer. CT data was obtained and used for attenuation correction and anatomic localization. Fasting blood glucose: 76 mg/dl COMPARISON:  CTA 06/24/2019 FINDINGS: Mediastinal blood pool activity: SUV max 1.8 Liver activity: SUV max NA NECK: No hypermetabolic lymph nodes in the neck. Incidental CT findings: none CHEST: 4.0 x 2.3 x 1.9 cm left upper lobe pulmonary mass identified on recent chest CT is markedly hypermetabolic with SUV max = 53.6 hypermetabolic left hilar metastatic demonstrates SUV max = 6.8. no evidence for hypermetabolic mediastinal or right hilar metastases. Incidental CT findings: Coronary artery calcification is evident. Atherosclerotic calcification is noted in the wall of the thoracic aorta. Scattered tiny calcified and noncalcified pulmonary nodules better evaluated on  the recent diagnostic CT chest. ABDOMEN/PELVIS: 5.2 cm lesion in the inferior left liver is markedly hypermetabolic with SUV max = 64.4. A second subtle liver lesion on noncontrast CT imaging measures 2.3 cm in the anterior right liver with SUV max = 12.5. No hypermetabolic lymphadenopathy in the abdomen or pelvis. Relatively long segment non focal uptake in the right colon is presumably physiologic with no underlying colonic mass lesion evident by CT. Incidental CT findings: There is a subtle suggestion of contour nodularity in the posterior right liver (see axial 151/3) raising the question of cirrhosis. There is abdominal aortic atherosclerosis without aneurysm. SKELETON: A large hypermetabolic lesion is identified at the level of L3, involving the right posterolateral L3 vertebral body, right pedicle, transverse process, lamina and spinous process. Subtle soft tissue component is identified in the right L3-4 foramen, right aspect of the spinal canal and in the right paraspinal muscles at this level. SUV max = 14.5 Incidental CT findings: none IMPRESSION: 1. Left upper lobe pulmonary mass is markedly hypermetabolic and is associated with hypermetabolic left hilar lymphadenopathy, large hypermetabolic liver lesions and a hypermetabolic L3 lesion that is amazingly subtle on CT imaging relative to its large size. Primary bronchogenic neoplasm with metastatic disease to the left hilum, liver, and lumbar spine would be a consideration. Given the suggestion of cirrhotic liver morphology and the liver and spinal lesions being larger than the lung lesion, primary hepatocellular carcinoma with metastatic disease to the lung and spine would also be a consideration. 2. The large L3 lesion involves the right L3 neuroforamen and right epidural space. Lumbar spine MRI with and without contrast may be warranted to further evaluate. These  results will be called to the ordering clinician or representative by the Radiologist  Assistant, and communication documented in the PACS or [employer]. Electronically Signed   By: Misty Stanley M.D.   On: 07/03/2019 13:10   US BIOPSY (LIVER)  Result Date: 07/14/2019 INDICATION: No known primary, now with hypermetabolic liver lesions worrisome for metastatic disease. Please from ultrasound-guided biopsy for tissue diagnostic purposes. EXAM: ULTRASOUND GUIDED LIVER LESION BIOPSY COMPARISON:  PET-CT-07/03/2019 MEDICATIONS: None ANESTHESIA/SEDATION: Fentanyl 50 mcg IV; Versed 1 mg IV Total Moderate Sedation time:  10 Minutes. The patient's level of consciousness and vital signs were monitored continuously by radiology nursing throughout the procedure under my direct supervision. COMPLICATIONS: None immediate. PROCEDURE: Informed written consent was obtained from the patient after a discussion of the risks, benefits and alternatives to treatment. The patient understands and consents the procedure. A timeout was performed prior to the initiation of the procedure. Ultrasound scanning was performed of the right upper abdominal quadrant demonstrates an approximately 5.6 x 5.5 cm isoechoic mass within the left lobe of the liver correlating with the dominant hypermetabolic lesion seen on preceding PET-CT image 143, series 3. The procedure was planned. The midline of the abdomen was prepped and draped in the usual sterile fashion. The overlying soft tissues were anesthetized with 1% lidocaine with epinephrine. A 17 gauge, 6.8 cm co-axial needle was advanced into a peripheral aspect of the lesion. This was followed by 5 core biopsies with an 18 gauge core device under direct ultrasound guidance. The coaxial needle tract was embolized with a small amount of Gel-Foam slurry and superficial hemostasis was obtained with manual compression. Post procedural scanning was negative for definitive area of hemorrhage or additional complication. A dressing was placed. The patient tolerated the procedure well  without immediate post procedural complication. IMPRESSION: Technically successful ultrasound guided core needle biopsy of dominant hypermetabolic mass within the left lobe of the liver. Electronically Signed   By: Sandi Mariscal M.D.   On: 07/14/2019 10:04   US Venous Img Lower Bilateral (DVT)  Result Date: 06/25/2019 CLINICAL DATA:  Bilateral lower extremity pain for the past 3 weeks. Evaluate for DVT. EXAM: BILATERAL LOWER EXTREMITY VENOUS DOPPLER ULTRASOUND TECHNIQUE: Gray-scale sonography with graded compression, as well as color Doppler and duplex ultrasound were performed to evaluate the lower extremity deep venous systems from the level of the common femoral vein and including the common femoral, femoral, profunda femoral, popliteal and calf veins including the posterior tibial, peroneal and gastrocnemius veins when visible. The superficial great saphenous vein was also interrogated. Spectral Doppler was utilized to evaluate flow at rest and with distal augmentation maneuvers in the common femoral, femoral and popliteal veins. COMPARISON:  None. FINDINGS: RIGHT LOWER EXTREMITY Common Femoral Vein: No evidence of thrombus. Normal compressibility, respiratory phasicity and response to augmentation. Saphenofemoral Junction: No evidence of thrombus. Normal compressibility and flow on color Doppler imaging. Profunda Femoral Vein: No evidence of thrombus. Normal compressibility and flow on color Doppler imaging. Femoral Vein: No evidence of thrombus. Normal compressibility, respiratory phasicity and response to augmentation. Popliteal Vein: No evidence of thrombus. Normal compressibility, respiratory phasicity and response to augmentation. Calf Veins: No evidence of thrombus. Normal compressibility and flow on color Doppler imaging. Superficial Great Saphenous Vein: No evidence of thrombus. Normal compressibility. Venous Reflux:  None. Other Findings:  None. LEFT LOWER EXTREMITY Common Femoral Vein: No evidence  of thrombus. Normal compressibility, respiratory phasicity and response to augmentation. Saphenofemoral Junction: No evidence of thrombus. Normal compressibility and flow  on color Doppler imaging. Profunda Femoral Vein: No evidence of thrombus. Normal compressibility and flow on color Doppler imaging. Femoral Vein: No evidence of thrombus. Normal compressibility, respiratory phasicity and response to augmentation. Popliteal Vein: No evidence of thrombus. Normal compressibility, respiratory phasicity and response to augmentation. Calf Veins: No evidence of thrombus. Normal compressibility and flow on color Doppler imaging. Superficial Great Saphenous Vein: No evidence of thrombus. Normal compressibility. Venous Reflux:  None. Other Findings:  None. IMPRESSION: No evidence of DVT within either lower extremity. Electronically Signed   By: Sandi Mariscal M.D.   On: 06/25/2019 16:48   DG Chest Portable 1 View  Result Date: 06/24/2019 CLINICAL DATA:  Hypoxia EXAM: PORTABLE CHEST 1 VIEW COMPARISON:  05/17/2018, 02/07/2016 FINDINGS: Hyperinflation with emphysematous disease and bronchitic changes. 2.5 cm oval opacity in the left perihilar region. Normal cardiomediastinal silhouette with aortic atherosclerosis. No pneumothorax. Apical pleural and parenchymal scarring. IMPRESSION: 1. Emphysematous disease with bronchitic changes. 2. 2.5 cm oval opacity in the left perihilar region, possibly representing a lung nodule. Chest CT is recommended for further evaluation. Electronically Signed   By: Donavan Foil M.D.   On: 06/24/2019 17:56   DG HIP UNILAT WITH PELVIS 2-3 VIEWS RIGHT  Result Date: 06/24/2019 CLINICAL DATA:  Right hip pain.  No known injury. EXAM: DG HIP (WITH OR WITHOUT PELVIS) 2-3V RIGHT COMPARISON:  None. FINDINGS: There is diffuse osteopenia. There is no acute displaced fracture or dislocation. There is some sclerosis visualized in the transcervical region of the proximal right femur. Mild degenerative  changes are noted of both hips. IMPRESSION: 1. No definite acute displaced fracture or dislocation. 2. Subtle sclerosis in the transcervical region may represent artifact. However, if there is high clinical suspicion follow-up with MRI is recommended given the presence of diffuse osteopenia. Electronically Signed   By: Constance Holster M.D.   On: 06/24/2019 20:53   DG FEMUR, MIN 2 VIEWS RIGHT  Result Date: 07/07/2019 CLINICAL DATA:  Distal right femur pain. EXAM: RIGHT FEMUR 2 VIEWS COMPARISON:  None. FINDINGS: No bone abnormality. No abnormal soft tissue finding other than some ordinary arterial calcification, less than often seen at this age. No significant knee joint pathology is seen. IMPRESSION: Negative radiography. Electronically Signed   By: Nelson Chimes M.D.   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  Discussed treatments are palliative; not curative.   # #Discussed chemo immunotherapy for extensive stage small cell lung cancer includes carbo etoposide-Tecentriq every 3 weeks x 4 cycles.   Discussed treatments are palliative not curative ~discussed the median survival is in the range of around 8-12 months.  Discussed the potential side effects including but not limited to-increasing fatigue, nausea vomiting, diarrhea, hair loss, sores in the mouth, increase risk of infection and also neuropathy.   # Also discussed if patient chooses not to proceed with any treatment options-the life expectancy would be measured in the order of few months.   #I discussed that would be reasonable to proceed with radiation to the brain and also the hips-for palliation of symptoms.   #At the end of the discussion patient seems to be reluctant with proceeding with any treatments-including radiation.   Discussed that if patient declines systemic therapy/radiation-I would recommend hospice.  #Right hip pain/bone mets L3-await patient's decision to proceed with  radiation.  Hold Zometa patient preference.  #Patient call us and let us know her preference-whether to proceed with treatment or not.   # DISPOSITION: # NO Zometa # follow up TBD- Dr.B  Cc; Dr.Crsytal.  All questions were answered. The patient knows to call the clinic with any problems, questions or concerns.    Cammie Sickle, MD 07/17/2019 12:04 PM

## 2019-07-17 NOTE — Progress Notes (Signed)
  Oncology Nurse Navigator Documentation  Navigator Location: [facility] (07/17/19 1200)   )Navigator Encounter Type: Initial RadOnc;Follow-up Appt (07/17/19 1200)     Confirmed Diagnosis Date: 07/16/19 (07/17/19 1200)               Patient Visit Type: MedOnc;RadOnc (07/17/19 1200) Treatment Phase: Pre-Tx/Tx Discussion (07/17/19 1200) Barriers/Navigation Needs: Education (07/17/19 1200) Education: Newly Diagnosed Cancer Education;Understanding Cancer/ Treatment Options (07/17/19 1200) Interventions: Education (07/17/19 1200)     Education Method: Verbal;Written (07/17/19 1200)      Acuity: Level 1-No Barriers (07/17/19 1200)      met with patient and her son during initial consult with Dr. Baruch Gouty and follow up with Dr. Rogue Bussing to discuss biopsy results and treatment options. All questions answered during visit. Pt given resources regarding diagnosis and supportive services available. Reviewed upcoming appts. Pt and son stated would like to discuss all options before deciding on pursuing chemotherapy treatments. Informed that will follow up with them at CT sim appt on Fri 4/9 but instructed to call if has any questions before next appt. Pt and her son verbalized understanding. Nothing further needed at this time.    Time Spent with Patient: 120 (07/17/19 1200)

## 2019-07-17 NOTE — Telephone Encounter (Signed)
On 4/07-I had a long discussion with the patient regarding the results of the brain MRI showing multiple metastatic lesions; and liver biopsy suggestive of small cell carcinoma of lung origin.  Discussed the disease is incurable; and very aggressive.  The median life expectancy is 8 to 12 months.  Patient family met with palliative care this morning.  Will discuss treatment options 4/08.   FYI-Dr.Sonnenberg.

## 2019-07-17 NOTE — Progress Notes (Signed)
Subjective:    Patient ID: Jane Davis, female    DOB: 01-09-1934, 86 y.o.   MRN: [id number]  HPI Patient is an 86 year old former smoker on 25 June 2019 as an inpatient.  She had been admitted to [facility] on 16 March with oxygen desaturations.  Her main complaint at the time was that of lower extremity pain and numbness.  She was noted to have oxygen saturations of 70% on room air.  A CT scan of the chest was performed to rule out PE. This did not show PE but did show a left lingular mass.  As the patient was going to be discharge she was scheduled for a bronchoscopy for 27 June 2019.  Unfortunately, the patient did not keep her n.p.o. status instructed prior to the procedure and the procedure had to be canceled as she could not receive general anesthesia.  Scheduled for a PET/CT and MRI of the brain in the interim as her procedure had to be rescheduled.  She was also scheduled to see oncology.  PET/CT was done on 25 March showed hypermetabolism on the left upper lobe pulmonary mass, left hilar adenopathy, and large hypermetabolic liver lesions predominantly on the lower lobes of the liver (not evident on prior CT angio chest).  The patient also had a large L3 lesion as well.  Underwent MRI of the brain on 2 April which has shown multiple small metastases.  Biopsy of the liver lesion was performed on 5 April which has shown small cell carcinoma.  Since discharge the patient has done well.  She has oxygen at home for management of her hypoxemia.  She monitors oxygen with an oxygen meter.  She lives with her very attentive son, he helps her monitor vitals.  She is on [employer] and doing well on this medication.  She does note occasional congestion which clears with the use of an as needed nebulizer.  She voices no other complaints today.  She has not had any fevers, chills or sweats.  Continues to have some lower extremity discomfort likely related to her L3 metastatic deposit.  She is to see  Dr. Baruch Gouty at 1030 today for evaluation of radiation treatments.  She voices no other complaint.  Otherwise states that she is getting along well.   Review of Systems A 10 point review of systems was performed and it is as noted above otherwise negative.    Objective:   Physical Exam BP (!) 112/50 (BP Location: Left Arm, Patient Position: Sitting, Cuff Size: Normal)   Pulse 61   Temp (!) 97.5 F (36.4 C) (Temporal)   Ht 5' (1.524 m)   Wt 107 lb 9.6 oz (48.8 kg)   SpO2 98%   BMI 21.01 kg/m   GENERAL: Spry elderly female, awake alert fully oriented comfortable without nasal cannula O2 (compliant with oxygen on exertion). HEAD: Normocephalic, atraumatic.  EYES: Pupils equal, round, reactive to light.  No scleral icterus.  MOUTH: Nose/mouth/throat not examined due to masking requirements for COVID 19.   NECK: Supple. No thyromegaly. No nodules. No JVD.  PULMONARY: Good air entry bilaterally.    No adventitious sounds. CARDIOVASCULAR: S1 and S2. Regular rate and rhythm.  No rubs murmurs or gallops heard. GASTROINTESTINAL: Abdomen nondistended. MUSCULOSKELETAL: No joint deformity, no clubbing, no edema.  NEUROLOGIC: No focal deficits noted. Awake alert, speech is fluent and clear.   SKIN: Intact,warm,dry.  No rashes noted. PSYCH: Mood and behavior normal.      Assessment & Plan:  COPD mixed type (chronic bronchitis/emphysema) Continue Anoro Follow-up here on an as-needed basis for now  Chronic respiratory failure with hypoxia Continue oxygen with exertion and at bedtime Patient has been compliant with oxygen and notes improvement in symptoms  Extensive stage small cell carcinoma of the lung Diagnosed by biopsy of liver lesion Patient has liver, bony and brain metastases To be evaluated by radiation oncology today Following with medical oncology Has been evaluated by oncology palliative team as well Continue follow-up with oncology for now  Patient will have multiple  follow-ups with oncology for management of her extensive stage small cell carcinoma of the lung.  We will make her follow-ups here on an as-needed basis.  The patient and her son both understand that they can call anytime for follow-up if she develops any issues with her respiratory status.  We are trying to limit appointments due to her advanced age and involved therapy with her small cell cancer.   Renold Don, MD [facility]  *This note was dictated using voice recognition software/Dragon.  Despite best efforts to proofread, errors can occur which can change the meaning.  Any change was purely unintentional.

## 2019-07-17 NOTE — Consult Note (Signed)
NEW PATIENT EVALUATION  Name: Jane Davis  MRN: [id number]  Date:   07/17/2019     DOB: 25-May-1933   This 86 y.o. female patient presents to the clinic for initial evaluation of stage IV small cell lung cancer with brain and spine mets.  REFERRING PHYSICIAN: Leone Haven, MD  CHIEF COMPLAINT:  Chief Complaint  Patient presents with  . Cancer    Initial consultation    DIAGNOSIS: The primary encounter diagnosis was Bone metastases ([place]). A diagnosis of Brain metastases [facility]) was also pertinent to this visit.   PREVIOUS INVESTIGATIONS:  MRI scan of brain PET CT scan and CT scans reviewed Pathology report reviewed Clinical notes reviewed  HPI: Patient is an 86 year old female who was seen in the hospital for exacerbation of COPD.  CT scan of her chest showed a 2.5 cm lesion with hilar adenopathy of the left upper lobe with follow-up PET scan showing multiple liver lesions compatible with metastatic disease also there is intense uptake at the L3 vertebral body consistent with metastatic disease she has been having some lower back pain.  MRI of her brain confirmed least 11 metastatic lesions scattered throughout the infratentorial and suprapatellar tentorial brain.  Bronchoscopy revealed small cell lung cancer.  She is seen today for consideration of palliative treatment.  She is having no significant neurologic complaints she has been started on Decadron.  Specifically denies change in visual fields or any focal motor or sensory levels.  She is ambulating well back pain is under fair control.  PLANNED TREATMENT REGIMEN: Whole brain radiation plus palliative radiation therapy to L-spine  PAST MEDICAL HISTORY:  has a past medical history of COPD (chronic obstructive pulmonary disease) ([place]), Essential hypertension (03/22/2016), Glaucoma, Hypertension, Hypothyroidism, Hypoxia (02/11/2016), Palpitations (03/22/2016), Prediabetes (02/19/2015), Rectal lesion (05/29/2016), and Urge  incontinence of urine (05/29/2016).    PAST SURGICAL HISTORY:  Past Surgical History:  Procedure Laterality Date  . ABDOMINAL HYSTERECTOMY    . COLONOSCOPY  2013   [place]     FAMILY HISTORY: family history includes Cancer in her brother and sister; Intracerebral hemorrhage in her mother.  SOCIAL HISTORY:  reports that she quit smoking about 3 years ago. Her smoking use included cigarettes. She has a 40.00 pack-year smoking history. She has never used smokeless tobacco. She reports that she does not drink alcohol or use drugs.  ALLERGIES: Patient has no known allergies.  MEDICATIONS:  Current Outpatient Medications  Medication Sig Dispense Refill  . albuterol (VENTOLIN HFA) 108 (90 Base) MCG/ACT inhaler Inhale 2 puffs into the lungs every 6 (six) hours as needed for wheezing or shortness of breath. 8 g 0  . aspirin EC 81 MG tablet Take 81 mg by mouth daily.    Marland Kitchen dexamethasone (DECADRON) 2 MG tablet 1 pill every 8 hours- take with meals 30 tablet 0  . HYDROcodone-acetaminophen (NORCO/VICODIN) 5-325 MG tablet Take 1 tablet by mouth every 6 (six) hours as needed for severe pain. 60 tablet 0  . latanoprost (XALATAN) 0.005 % ophthalmic solution Place 1 drop into both eyes at bedtime.     Marland Kitchen levothyroxine (SYNTHROID) 75 MCG tablet Take 1 tablet (75 mcg total) by mouth daily. 90 tablet 1  . olmesartan (BENICAR) 20 MG tablet Take 1 tablet (20 mg total) by mouth daily. 90 tablet 1  . Timolol Maleate 0.5 % (DAILY) SOLN Place 1 drop into the right eye daily.    Marland Kitchen umeclidinium-vilanterol (ANORO ELLIPTA) 62.5-25 MCG/INH AEPB Inhale 1 puff into the lungs  daily. 30 each 0   No current facility-administered medications for this encounter.    ECOG PERFORMANCE STATUS:  0 - Asymptomatic  REVIEW OF SYSTEMS: Patient denies any weight loss, fatigue, weakness, fever, chills or night sweats. Patient denies any loss of vision, blurred vision. Patient denies any ringing  of the ears or hearing loss. No  irregular heartbeat. Patient denies heart murmur or history of fainting. Patient denies any chest pain or pain radiating to her upper extremities. Patient denies any shortness of breath, difficulty breathing at night, cough or hemoptysis. Patient denies any swelling in the lower legs. Patient denies any nausea vomiting, vomiting of blood, or coffee ground material in the vomitus. Patient denies any stomach pain. Patient states has had normal bowel movements no significant constipation or diarrhea. Patient denies any dysuria, hematuria or significant nocturia. Patient denies any problems walking, swelling in the joints or loss of balance. Patient denies any skin changes, loss of hair or loss of weight. Patient denies any excessive worrying or anxiety or significant depression. Patient denies any problems with insomnia. Patient denies excessive thirst, polyuria, polydipsia. Patient denies any swollen glands, patient denies easy bruising or easy bleeding. Patient denies any recent infections, allergies or URI. Patient "s visual fields have not changed significantly in recent time.   PHYSICAL EXAM: BP (!) 130/54 (BP Location: Left Arm, Patient Position: Sitting)   Pulse 61   Temp (!) 97.5 F (36.4 C) (Tympanic)   Resp 16   Wt 107 lb (48.5 kg)   BMI 20.90 kg/m  Well-developed well-nourished patient in NAD. HEENT reveals PERLA, EOMI, discs not visualized.  Oral cavity is clear. No oral mucosal lesions are identified. Neck is clear without evidence of cervical or supraclavicular adenopathy. Lungs are clear to A&P. Cardiac examination is essentially unremarkable with regular rate and rhythm without murmur rub or thrill. Abdomen is benign with no organomegaly or masses noted. Motor sensory and DTR levels are equal and symmetric in the upper and lower extremities. Cranial nerves II through XII are grossly intact. Proprioception is intact. No peripheral adenopathy or edema is identified. No motor or sensory levels  are noted. Crude visual fields are within normal range.  LABORATORY DATA: Pathology report reviewed    RADIOLOGY RESULTS: Brain MRI PET/CT and CT scans reviewed compatible with above-stated findings   IMPRESSION: Stage IV small cell lung cancer with brain and bone metastasis in 86 year old female  PLAN: At this time elected ahead with whole brain radiation.  I would plan on delivering 3000 cGy in 10 fractions to her whole brain.  Would also treat her L-spine to 3000 cGy in 10 fractions will start on her whole brain and labs personally set up and ordered CT simulation for tomorrow.  We will start simulation on her spine next week.  She remains on Decadron at this time.  Patient is also scheduled to start systemic treatment with medical oncology.  Patient and husband both seem to comprehend my treatment plan well.  Risks and benefits of treatment occluding hair loss fatigue skin reaction and abdominal complaints related to her L-spine treatment possible diarrhea all were discussed in detail with the patient and her husband.  Nurse navigator was present during my interview.  I would like to take this opportunity to thank you for allowing me to participate in the care of your patient.Noreene Filbert, MD

## 2019-07-17 NOTE — Assessment & Plan Note (Addendum)
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  Discussed treatments are palliative; not curative.   # #Discussed chemo immunotherapy for extensive stage small cell lung cancer includes carbo etoposide-Tecentriq every 3 weeks x 4 cycles.   Discussed treatments are palliative not curative ~discussed the median survival is in the range of around 8-12 months.  Discussed the potential side effects including but not limited to-increasing fatigue, nausea vomiting, diarrhea, hair loss, sores in the mouth, increase risk of infection and also neuropathy.   # Also discussed if patient chooses not to proceed with any treatment options-the life expectancy would be measured in the order of few months.   #I discussed that would be reasonable to proceed with radiation to the brain and also the hips-for palliation of symptoms.   #At the end of the discussion patient seems to be reluctant with proceeding with any treatments-including radiation.  Discussed that if patient declines systemic therapy/radiation-I would recommend hospice.  #Right hip pain/bone mets L3-await patient's decision to proceed with radiation.  Hold Zometa patient preference.  #Patient call us and let us know her preference-whether to proceed with treatment or not.   Addendum: Pt suffers from weakness due to small cell lung cancer that impairs their ability to perform activities in the home such as feeding or bathing. A walker will not resolve the issue. Patient is unable to propel the standard wheelchair in the home due to weakness. Patient is able to safely propel the lightweight wheelchair.   # DISPOSITION: # NO Zometa # follow up TBD- Dr.B  Cc; Dr.Crsytal.

## 2019-07-18 ENCOUNTER — Telehealth: Payer: Self-pay

## 2019-07-18 ENCOUNTER — Ambulatory Visit: Payer: Medicare HMO

## 2019-07-18 ENCOUNTER — Encounter: Payer: Self-pay | Admitting: *Deleted

## 2019-07-18 ENCOUNTER — Ambulatory Visit
Admission: RE | Admit: 2019-07-18 | Discharge: 2019-07-18 | Disposition: A | Discharge status: Home / Self Care | Payer: Medicare HMO | Source: Ambulatory Visit | Attending: Radiation Oncology | Admitting: Radiation Oncology

## 2019-07-18 ENCOUNTER — Ambulatory Visit: Payer: Medicare HMO | Admitting: Internal Medicine

## 2019-07-18 ENCOUNTER — Other Ambulatory Visit: Payer: Self-pay | Admitting: *Deleted

## 2019-07-18 DIAGNOSIS — J449 Chronic obstructive pulmonary disease, unspecified: Secondary | ICD-10-CM | POA: Diagnosis not present

## 2019-07-18 DIAGNOSIS — Z510 Encounter for antineoplastic radiation therapy: Secondary | ICD-10-CM | POA: Diagnosis not present

## 2019-07-18 DIAGNOSIS — C3412 Malignant neoplasm of upper lobe, left bronchus or lung: Secondary | ICD-10-CM | POA: Diagnosis not present

## 2019-07-18 DIAGNOSIS — C787 Secondary malignant neoplasm of liver and intrahepatic bile duct: Secondary | ICD-10-CM | POA: Diagnosis not present

## 2019-07-18 DIAGNOSIS — M549 Dorsalgia, unspecified: Secondary | ICD-10-CM | POA: Diagnosis not present

## 2019-07-18 DIAGNOSIS — C7951 Secondary malignant neoplasm of bone: Secondary | ICD-10-CM | POA: Diagnosis not present

## 2019-07-18 DIAGNOSIS — E039 Hypothyroidism, unspecified: Secondary | ICD-10-CM | POA: Diagnosis not present

## 2019-07-18 DIAGNOSIS — I10 Essential (primary) hypertension: Secondary | ICD-10-CM | POA: Diagnosis not present

## 2019-07-18 DIAGNOSIS — C7931 Secondary malignant neoplasm of brain: Secondary | ICD-10-CM | POA: Diagnosis not present

## 2019-07-18 NOTE — Telephone Encounter (Signed)
Faxed today.  Nina,cma

## 2019-07-18 NOTE — Telephone Encounter (Signed)
Signed. Please fax.

## 2019-07-18 NOTE — Progress Notes (Signed)
  Oncology Nurse Navigator Documentation  Navigator Location: [facility] Onc (07/18/19 1400)   )Navigator Encounter Type: Lobby (07/18/19 1400)                     Patient Visit Type: BLTGAI (07/18/19 1400) Treatment Phase: CT SIM (07/18/19 1400) Barriers/Navigation Needs: Coordination of Care (07/18/19 1400)   Interventions: Coordination of Care (07/18/19 1400)   Coordination of Care: Appts (07/18/19 1400)         met with patient and her daughter in the lobby after CT simulation today. All questions answered during visit. Pt rescheduled for zometa infusions and follow up with Dr. Rogue Bussing. Pt continues to have concerns regarding chemo treatments. Pt and her family will continue to discuss and let us know how they would like to proceed. Instructed pt and her daughter to call if has any further questions or needs. Pt verbalized understanding.          Time Spent with Patient: 30 (07/18/19 1400)

## 2019-07-21 ENCOUNTER — Encounter: Payer: Self-pay | Admitting: *Deleted

## 2019-07-21 ENCOUNTER — Telehealth: Payer: Self-pay | Admitting: Internal Medicine

## 2019-07-21 NOTE — Telephone Encounter (Signed)
On 04/09-spoke to patient's daughter, Jane Davis at length-regarding her mother's diagnosis/overall poor prognosis.  Understands radiation is not going to treat her systemic/visceral disease.  Discussed the treatment options again at length including-chemoimmunotherapy vs immunotherapy alone versus reduced dose of chemotherapy.  Discussed the role regarding Zometa.  For now the plan to proceed with radiation.  Further plan to be decided down the line.  Follow-up as planned  [facility]

## 2019-07-22 ENCOUNTER — Ambulatory Visit
Admission: RE | Admit: 2019-07-22 | Discharge: 2019-07-22 | Disposition: A | Discharge status: Home / Self Care | Payer: Medicare HMO | Source: Ambulatory Visit | Attending: Radiation Oncology | Admitting: Radiation Oncology

## 2019-07-22 ENCOUNTER — Encounter: Payer: Self-pay | Admitting: *Deleted

## 2019-07-22 DIAGNOSIS — C7931 Secondary malignant neoplasm of brain: Secondary | ICD-10-CM | POA: Diagnosis not present

## 2019-07-22 DIAGNOSIS — I10 Essential (primary) hypertension: Secondary | ICD-10-CM | POA: Diagnosis not present

## 2019-07-22 DIAGNOSIS — M549 Dorsalgia, unspecified: Secondary | ICD-10-CM | POA: Diagnosis not present

## 2019-07-22 DIAGNOSIS — E039 Hypothyroidism, unspecified: Secondary | ICD-10-CM | POA: Diagnosis not present

## 2019-07-22 DIAGNOSIS — J449 Chronic obstructive pulmonary disease, unspecified: Secondary | ICD-10-CM | POA: Diagnosis not present

## 2019-07-22 DIAGNOSIS — C7951 Secondary malignant neoplasm of bone: Secondary | ICD-10-CM | POA: Diagnosis not present

## 2019-07-22 DIAGNOSIS — C3412 Malignant neoplasm of upper lobe, left bronchus or lung: Secondary | ICD-10-CM | POA: Diagnosis not present

## 2019-07-22 DIAGNOSIS — Z510 Encounter for antineoplastic radiation therapy: Secondary | ICD-10-CM | POA: Diagnosis not present

## 2019-07-22 DIAGNOSIS — C787 Secondary malignant neoplasm of liver and intrahepatic bile duct: Secondary | ICD-10-CM | POA: Diagnosis not present

## 2019-07-22 NOTE — Progress Notes (Signed)
  Oncology Nurse Navigator Documentation  Navigator Location: [facility] On[facility] (07/22/19 1000)   )Navigator Encounter Type: Treatment (07/22/19 1000)                   Treatment Initiated Date: 07/22/19 (07/22/19 1000) Patient Visit Type: RadOnc (07/22/19 1000) Treatment Phase: First Radiation Tx (07/22/19 1000) Barriers/Navigation Needs: No Barriers At This Time (07/22/19 1000)   Interventions: None Required (07/22/19 1000)           met with patient and her daughter after completing first radiation treatment. No questions or needs during visit. Reviewed upcoming appts. Instructed to call if has any further questions or needs. Pt and her daughter verbalized understanding.           Time Spent with Patient: 30 (07/22/19 1000)

## 2019-07-23 ENCOUNTER — Ambulatory Visit
Admission: RE | Admit: 2019-07-23 | Discharge: 2019-07-23 | Disposition: A | Discharge status: Home / Self Care | Payer: Medicare HMO | Source: Ambulatory Visit | Attending: Radiation Oncology | Admitting: Radiation Oncology

## 2019-07-23 DIAGNOSIS — Z510 Encounter for antineoplastic radiation therapy: Secondary | ICD-10-CM | POA: Diagnosis not present

## 2019-07-23 DIAGNOSIS — J449 Chronic obstructive pulmonary disease, unspecified: Secondary | ICD-10-CM | POA: Diagnosis not present

## 2019-07-23 DIAGNOSIS — I10 Essential (primary) hypertension: Secondary | ICD-10-CM | POA: Diagnosis not present

## 2019-07-23 DIAGNOSIS — C3412 Malignant neoplasm of upper lobe, left bronchus or lung: Secondary | ICD-10-CM | POA: Diagnosis not present

## 2019-07-23 DIAGNOSIS — M549 Dorsalgia, unspecified: Secondary | ICD-10-CM | POA: Diagnosis not present

## 2019-07-23 DIAGNOSIS — C787 Secondary malignant neoplasm of liver and intrahepatic bile duct: Secondary | ICD-10-CM | POA: Diagnosis not present

## 2019-07-23 DIAGNOSIS — C7951 Secondary malignant neoplasm of bone: Secondary | ICD-10-CM | POA: Diagnosis not present

## 2019-07-23 DIAGNOSIS — E039 Hypothyroidism, unspecified: Secondary | ICD-10-CM | POA: Diagnosis not present

## 2019-07-23 DIAGNOSIS — C7931 Secondary malignant neoplasm of brain: Secondary | ICD-10-CM | POA: Diagnosis not present

## 2019-07-24 ENCOUNTER — Ambulatory Visit
Admission: RE | Admit: 2019-07-24 | Discharge: 2019-07-24 | Disposition: A | Discharge status: Home / Self Care | Payer: Medicare HMO | Source: Ambulatory Visit | Attending: Radiation Oncology | Admitting: Radiation Oncology

## 2019-07-24 ENCOUNTER — Encounter: Payer: Self-pay | Admitting: *Deleted

## 2019-07-24 ENCOUNTER — Other Ambulatory Visit: Payer: Medicare HMO

## 2019-07-24 ENCOUNTER — Other Ambulatory Visit: Payer: Self-pay

## 2019-07-24 ENCOUNTER — Inpatient Hospital Stay: Payer: Medicare HMO

## 2019-07-24 VITALS — BP 148/67 | HR 60 | Temp 97.1°F | Resp 18

## 2019-07-24 DIAGNOSIS — C3490 Malignant neoplasm of unspecified part of unspecified bronchus or lung: Secondary | ICD-10-CM

## 2019-07-24 DIAGNOSIS — Z90710 Acquired absence of both cervix and uterus: Secondary | ICD-10-CM | POA: Diagnosis not present

## 2019-07-24 DIAGNOSIS — C787 Secondary malignant neoplasm of liver and intrahepatic bile duct: Secondary | ICD-10-CM | POA: Diagnosis not present

## 2019-07-24 DIAGNOSIS — Z7189 Other specified counseling: Secondary | ICD-10-CM

## 2019-07-24 DIAGNOSIS — C3412 Malignant neoplasm of upper lobe, left bronchus or lung: Secondary | ICD-10-CM | POA: Diagnosis not present

## 2019-07-24 DIAGNOSIS — Z510 Encounter for antineoplastic radiation therapy: Secondary | ICD-10-CM | POA: Diagnosis not present

## 2019-07-24 DIAGNOSIS — E039 Hypothyroidism, unspecified: Secondary | ICD-10-CM | POA: Diagnosis not present

## 2019-07-24 DIAGNOSIS — I10 Essential (primary) hypertension: Secondary | ICD-10-CM | POA: Diagnosis not present

## 2019-07-24 DIAGNOSIS — C7951 Secondary malignant neoplasm of bone: Secondary | ICD-10-CM

## 2019-07-24 DIAGNOSIS — J449 Chronic obstructive pulmonary disease, unspecified: Secondary | ICD-10-CM | POA: Diagnosis not present

## 2019-07-24 DIAGNOSIS — M549 Dorsalgia, unspecified: Secondary | ICD-10-CM | POA: Diagnosis not present

## 2019-07-24 DIAGNOSIS — C7801 Secondary malignant neoplasm of right lung: Secondary | ICD-10-CM | POA: Diagnosis not present

## 2019-07-24 DIAGNOSIS — K5900 Constipation, unspecified: Secondary | ICD-10-CM | POA: Diagnosis not present

## 2019-07-24 DIAGNOSIS — R112 Nausea with vomiting, unspecified: Secondary | ICD-10-CM | POA: Diagnosis not present

## 2019-07-24 DIAGNOSIS — Z87891 Personal history of nicotine dependence: Secondary | ICD-10-CM | POA: Diagnosis not present

## 2019-07-24 DIAGNOSIS — C7931 Secondary malignant neoplasm of brain: Secondary | ICD-10-CM | POA: Diagnosis not present

## 2019-07-24 MED ORDER — SODIUM CHLORIDE 0.9 % IV SOLN
Freq: Once | INTRAVENOUS | Status: AC
  Filled 2019-07-24: qty 250

## 2019-07-24 MED ORDER — ZOLEDRONIC ACID 4 MG/5ML IV CONC
3.0000 mg | Freq: Once | INTRAVENOUS | Status: AC
  Administered 2019-07-24: 3 mg via INTRAVENOUS
  Filled 2019-07-24: qty 3.75

## 2019-07-24 NOTE — Progress Notes (Signed)
Tumor Board Documentation  Jane Davis was presented by Dr Rogue Bussing at our Tumor Board on 07/24/2019, which included representatives from medical oncology, radiation oncology, navigation, pathology, radiology, surgical, surgical oncology, internal medicine, genetics, palliative care, research.  Jane Davis currently presents as a current patient, for [place] with history of the following treatments: active survellience, surgical intervention(s).  Additionally, we reviewed previous medical and familial history, history of present illness, and recent lab results along with all available histopathologic and imaging studies. The tumor board considered available treatment options and made the following recommendations: Concurrent chemo-radiation therapy, Immunotherapy    The following procedures/referrals were also placed: No orders of the defined types were placed in this encounter.   Clinical Trial Status: not discussed   Staging used: Clinical Stage  AJCC Staging:       Group: Stage 4 Metastatic Small Cell Lung Cancer   National site-specific guidelines   were discussed with respect to the case.  Tumor board is a meeting of clinicians from various specialty areas who evaluate and discuss patients for whom a multidisciplinary approach is being considered. Final determinations in the plan of care are those of the provider(s). The responsibility for follow up of recommendations given during tumor board is that of the provider.   Today's extended care, comprehensive team conference, [name] was not present for the discussion and was not examined.   Multidisciplinary Tumor Board is a multidisciplinary case peer review process.  Decisions discussed in the Multidisciplinary Tumor Board reflect the opinions of the specialists present at the conference without having examined the patient.  Ultimately, treatment and diagnostic decisions rest with the primary provider(s) and the patient.

## 2019-07-24 NOTE — Progress Notes (Signed)
Ok to give Zometa today based on labs from 3/29 per MD

## 2019-07-25 ENCOUNTER — Ambulatory Visit
Admission: RE | Admit: 2019-07-25 | Discharge: 2019-07-25 | Disposition: A | Discharge status: Home / Self Care | Payer: Medicare HMO | Source: Ambulatory Visit | Attending: Radiation Oncology | Admitting: Radiation Oncology

## 2019-07-25 ENCOUNTER — Encounter: Payer: Self-pay | Admitting: *Deleted

## 2019-07-25 DIAGNOSIS — C787 Secondary malignant neoplasm of liver and intrahepatic bile duct: Secondary | ICD-10-CM | POA: Diagnosis not present

## 2019-07-25 DIAGNOSIS — C3412 Malignant neoplasm of upper lobe, left bronchus or lung: Secondary | ICD-10-CM | POA: Diagnosis not present

## 2019-07-25 DIAGNOSIS — E039 Hypothyroidism, unspecified: Secondary | ICD-10-CM | POA: Diagnosis not present

## 2019-07-25 DIAGNOSIS — C7931 Secondary malignant neoplasm of brain: Secondary | ICD-10-CM | POA: Diagnosis not present

## 2019-07-25 DIAGNOSIS — I10 Essential (primary) hypertension: Secondary | ICD-10-CM | POA: Diagnosis not present

## 2019-07-25 DIAGNOSIS — J449 Chronic obstructive pulmonary disease, unspecified: Secondary | ICD-10-CM | POA: Diagnosis not present

## 2019-07-25 DIAGNOSIS — C7951 Secondary malignant neoplasm of bone: Secondary | ICD-10-CM | POA: Diagnosis not present

## 2019-07-25 DIAGNOSIS — Z510 Encounter for antineoplastic radiation therapy: Secondary | ICD-10-CM | POA: Diagnosis not present

## 2019-07-25 DIAGNOSIS — M549 Dorsalgia, unspecified: Secondary | ICD-10-CM | POA: Diagnosis not present

## 2019-07-25 NOTE — Addendum Note (Signed)
Addended by: Telford Nab on: 07/25/2019 11:53 AM   Modules accepted: Orders

## 2019-07-28 ENCOUNTER — Ambulatory Visit
Admission: RE | Admit: 2019-07-28 | Discharge: 2019-07-28 | Disposition: A | Discharge status: Home / Self Care | Payer: Medicare HMO | Source: Ambulatory Visit | Attending: Radiation Oncology | Admitting: Radiation Oncology

## 2019-07-28 ENCOUNTER — Ambulatory Visit: Admission: RE | Admit: 2019-07-28 | Payer: Medicare HMO | Source: Ambulatory Visit

## 2019-07-28 ENCOUNTER — Telehealth: Payer: Self-pay | Admitting: Adult Health Nurse Practitioner

## 2019-07-28 DIAGNOSIS — C3412 Malignant neoplasm of upper lobe, left bronchus or lung: Secondary | ICD-10-CM | POA: Diagnosis not present

## 2019-07-28 DIAGNOSIS — E039 Hypothyroidism, unspecified: Secondary | ICD-10-CM | POA: Diagnosis not present

## 2019-07-28 DIAGNOSIS — M549 Dorsalgia, unspecified: Secondary | ICD-10-CM | POA: Diagnosis not present

## 2019-07-28 DIAGNOSIS — C7931 Secondary malignant neoplasm of brain: Secondary | ICD-10-CM | POA: Diagnosis not present

## 2019-07-28 DIAGNOSIS — J449 Chronic obstructive pulmonary disease, unspecified: Secondary | ICD-10-CM | POA: Diagnosis not present

## 2019-07-28 DIAGNOSIS — C787 Secondary malignant neoplasm of liver and intrahepatic bile duct: Secondary | ICD-10-CM | POA: Diagnosis not present

## 2019-07-28 DIAGNOSIS — Z510 Encounter for antineoplastic radiation therapy: Secondary | ICD-10-CM | POA: Diagnosis not present

## 2019-07-28 DIAGNOSIS — C7951 Secondary malignant neoplasm of bone: Secondary | ICD-10-CM | POA: Diagnosis not present

## 2019-07-28 DIAGNOSIS — I10 Essential (primary) hypertension: Secondary | ICD-10-CM | POA: Diagnosis not present

## 2019-07-28 NOTE — Telephone Encounter (Signed)
Spoke with patient's son Gracelyn Nurse regarding Palliative services and all questions were answered and he was in agreement with this.  I have scheduled an In-person Consult for 07/29/19 @ 8:30 AM.

## 2019-07-29 ENCOUNTER — Other Ambulatory Visit: Payer: Medicare HMO | Admitting: Adult Health Nurse Practitioner

## 2019-07-29 ENCOUNTER — Ambulatory Visit
Admission: RE | Admit: 2019-07-29 | Discharge: 2019-07-29 | Disposition: A | Discharge status: Home / Self Care | Payer: Medicare HMO | Source: Ambulatory Visit | Attending: Radiation Oncology | Admitting: Radiation Oncology

## 2019-07-29 ENCOUNTER — Other Ambulatory Visit: Payer: Self-pay

## 2019-07-29 DIAGNOSIS — Z515 Encounter for palliative care: Secondary | ICD-10-CM | POA: Diagnosis not present

## 2019-07-29 DIAGNOSIS — Z87891 Personal history of nicotine dependence: Secondary | ICD-10-CM | POA: Diagnosis not present

## 2019-07-29 DIAGNOSIS — C787 Secondary malignant neoplasm of liver and intrahepatic bile duct: Secondary | ICD-10-CM | POA: Diagnosis not present

## 2019-07-29 DIAGNOSIS — M549 Dorsalgia, unspecified: Secondary | ICD-10-CM | POA: Diagnosis not present

## 2019-07-29 DIAGNOSIS — M899 Disorder of bone, unspecified: Secondary | ICD-10-CM | POA: Diagnosis not present

## 2019-07-29 DIAGNOSIS — C7931 Secondary malignant neoplasm of brain: Secondary | ICD-10-CM | POA: Diagnosis not present

## 2019-07-29 DIAGNOSIS — J449 Chronic obstructive pulmonary disease, unspecified: Secondary | ICD-10-CM | POA: Diagnosis not present

## 2019-07-29 DIAGNOSIS — R52 Pain, unspecified: Secondary | ICD-10-CM | POA: Diagnosis not present

## 2019-07-29 DIAGNOSIS — C3412 Malignant neoplasm of upper lobe, left bronchus or lung: Secondary | ICD-10-CM | POA: Diagnosis not present

## 2019-07-29 DIAGNOSIS — R112 Nausea with vomiting, unspecified: Secondary | ICD-10-CM | POA: Diagnosis not present

## 2019-07-29 DIAGNOSIS — E039 Hypothyroidism, unspecified: Secondary | ICD-10-CM | POA: Diagnosis not present

## 2019-07-29 DIAGNOSIS — Z90710 Acquired absence of both cervix and uterus: Secondary | ICD-10-CM | POA: Diagnosis not present

## 2019-07-29 DIAGNOSIS — C7801 Secondary malignant neoplasm of right lung: Secondary | ICD-10-CM | POA: Diagnosis not present

## 2019-07-29 DIAGNOSIS — I10 Essential (primary) hypertension: Secondary | ICD-10-CM | POA: Diagnosis not present

## 2019-07-29 DIAGNOSIS — K5900 Constipation, unspecified: Secondary | ICD-10-CM | POA: Diagnosis not present

## 2019-07-29 DIAGNOSIS — C7951 Secondary malignant neoplasm of bone: Secondary | ICD-10-CM | POA: Diagnosis not present

## 2019-07-29 DIAGNOSIS — Z510 Encounter for antineoplastic radiation therapy: Secondary | ICD-10-CM | POA: Diagnosis not present

## 2019-07-29 NOTE — Progress Notes (Signed)
[facility] Pall[facility] Consult Note Telephone: [phone]  Fax: [phone]  PATIENT NAME: Jane Davis DOB: [birth year] MRN: [id number]  PRIMARY CARE PROVIDER:   Leone Haven, MD  REFERRING PROVIDER:  Dr. Rogue Bussing, oncology  RESPONSIBLE PARTY:   Self and son Mellody Memos:  [phone] C:  [id number]    RECOMMENDATIONS and PLAN:  1.  Advanced care planning.  Patient has Advanced Directives uploaded in [facility].  Son, Gracelyn Nurse, and daughter, Ree Edman, are listed as HCPOA.  Discussed DNR and she is unsure at this time is she wants CPR or not.  Have advised to discuss more with family so that her medical wishes are known to her family  2.  Stage IV small cell lung cancer.  Patient was in hospital 3/16-3/18/21 for acute respiratory failure and lingular mass was found on CT scan.  She is being followed by Dr. Rogue Bussing with oncology.  She is currently receiving radiation therapy and bone infusions.  She does not want to go through chemo.  Patient is able to walk without assistive devices and able to perform ADLs independently and is able to help with household chores and meal prep. States feeling slower but this is not new.  Her appetite is good and weight stable around 108 pounds.  She has COPD and uses albuterol nebulizer treatments when needed.  Has oxygen in the home but does not need to use it continuously.  She only needs to use it when needed such as when she may have bronchitis. Denies falls, N/V/D, constipation.  Has appointment with Dr. Rogue Bussing tomorrow and son wants to discuss prognosis.  Discussed disease progression today and option for hospice when needed.  Continue follow up and recommendations by oncology  3.  Pain.  Patient has bone mets and has pain in hips.  Currently has dexamethasone 2mg  every 8 hours and Norco 5/325 mg every 6 hours as needed.  She is taking the dexamethasone twice a day and the Norco 3 times a  day with good relief.  Continue current pain regimen  Patient stable at this time. Palliative care will continue to monitor for symptom management/decline and make recommendations as needed.  Next appointment in 6 weeks.  Encouraged to call with any questions or concerns  I spent 90 minutes providing this consultation,  from 8:30 to 10:00 including time spent with patient/family, chart review, provider coordination, documentation. More than 50% of the time in this consultation was spent coordinating communication.   HISTORY OF PRESENT ILLNESS:  Jane Davis is a 86 y.o. year old female with multiple medical problems including stage IV small cell lung cancer, COPD, HTN, hypothyroidism, prediabetes. Palliative Care was asked to help address goals of care.   CODE STATUS: see above  PPS: 60% HOSPICE ELIGIBILITY/DIAGNOSIS: TBD  PHYSICAL EXAM:  BP 128/70  HR 55 O2 99% on RA General: NAD, frail appearing, thin Cardiovascular: regular rate and rhythm Pulmonary: lung sounds clear; normal respiratory effort Extremities: no edema, no joint deformities Skin: no rashes on exposed skin Neurological: Weakness but otherwise nonfocal  PAST MEDICAL HISTORY:  Past Medical History:  Diagnosis Date  . COPD (chronic obstructive pulmonary disease) ([place])   . Essential hypertension 03/22/2016  . Glaucoma   . Hypertension   . Hypothyroidism   . Hypoxia 02/11/2016   Overview:  Discharged from hospital on home oxygen.  . Palpitations 03/22/2016  . Prediabetes 02/19/2015  . Rectal lesion 05/29/2016  . Urge incontinence of  urine 05/29/2016    SOCIAL HX:  Social History   Tobacco Use  . Smoking status: Former Smoker    Packs/day: 1.00    Years: 40.00    Pack years: 40.00    Types: Cigarettes    Quit date: 01/11/2016    Years since quitting: 3.5  . Smokeless tobacco: Never Used  Substance Use Topics  . Alcohol use: No    ALLERGIES: No Known Allergies   PERTINENT MEDICATIONS:    Outpatient Encounter Medications as of 07/29/2019  Medication Sig  . albuterol (VENTOLIN HFA) 108 (90 Base) MCG/ACT inhaler Inhale 2 puffs into the lungs every 6 (six) hours as needed for wheezing or shortness of breath.  Marland Kitchen aspirin EC 81 MG tablet Take 81 mg by mouth daily.  Marland Kitchen dexamethasone (DECADRON) 2 MG tablet 1 pill every 8 hours- take with meals  . HYDROcodone-acetaminophen (NORCO/VICODIN) 5-325 MG tablet Take 1 tablet by mouth every 6 (six) hours as needed for severe pain.  Marland Kitchen latanoprost (XALATAN) 0.005 % ophthalmic solution Place 1 drop into both eyes at bedtime.   Marland Kitchen levothyroxine (SYNTHROID) 75 MCG tablet Take 1 tablet (75 mcg total) by mouth daily.  Marland Kitchen olmesartan (BENICAR) 20 MG tablet Take 1 tablet (20 mg total) by mouth daily.  . Timolol Maleate 0.5 % (DAILY) SOLN Place 1 drop into the right eye daily.   No facility-administered encounter medications on file as of 07/29/2019.      Ad Guttman Jenetta Downer, NP

## 2019-07-30 ENCOUNTER — Inpatient Hospital Stay: Payer: Medicare HMO

## 2019-07-30 ENCOUNTER — Inpatient Hospital Stay (HOSPITAL_BASED_OUTPATIENT_CLINIC_OR_DEPARTMENT_OTHER): Payer: Medicare HMO | Admitting: Internal Medicine

## 2019-07-30 ENCOUNTER — Ambulatory Visit
Admission: RE | Admit: 2019-07-30 | Discharge: 2019-07-30 | Disposition: A | Discharge status: Home / Self Care | Payer: Medicare HMO | Source: Ambulatory Visit | Attending: Radiation Oncology | Admitting: Radiation Oncology

## 2019-07-30 ENCOUNTER — Other Ambulatory Visit: Payer: Self-pay | Admitting: *Deleted

## 2019-07-30 ENCOUNTER — Other Ambulatory Visit: Payer: Self-pay

## 2019-07-30 ENCOUNTER — Inpatient Hospital Stay (HOSPITAL_BASED_OUTPATIENT_CLINIC_OR_DEPARTMENT_OTHER): Payer: Medicare HMO | Admitting: Hospice and Palliative Medicine

## 2019-07-30 DIAGNOSIS — K5900 Constipation, unspecified: Secondary | ICD-10-CM | POA: Diagnosis not present

## 2019-07-30 DIAGNOSIS — R112 Nausea with vomiting, unspecified: Secondary | ICD-10-CM

## 2019-07-30 DIAGNOSIS — C3412 Malignant neoplasm of upper lobe, left bronchus or lung: Secondary | ICD-10-CM

## 2019-07-30 DIAGNOSIS — Z515 Encounter for palliative care: Secondary | ICD-10-CM

## 2019-07-30 DIAGNOSIS — C787 Secondary malignant neoplasm of liver and intrahepatic bile duct: Secondary | ICD-10-CM | POA: Diagnosis not present

## 2019-07-30 DIAGNOSIS — C7801 Secondary malignant neoplasm of right lung: Secondary | ICD-10-CM | POA: Diagnosis not present

## 2019-07-30 DIAGNOSIS — R52 Pain, unspecified: Secondary | ICD-10-CM

## 2019-07-30 DIAGNOSIS — C3490 Malignant neoplasm of unspecified part of unspecified bronchus or lung: Secondary | ICD-10-CM

## 2019-07-30 DIAGNOSIS — E039 Hypothyroidism, unspecified: Secondary | ICD-10-CM | POA: Diagnosis not present

## 2019-07-30 DIAGNOSIS — M549 Dorsalgia, unspecified: Secondary | ICD-10-CM | POA: Diagnosis not present

## 2019-07-30 DIAGNOSIS — C7931 Secondary malignant neoplasm of brain: Secondary | ICD-10-CM | POA: Diagnosis not present

## 2019-07-30 DIAGNOSIS — I10 Essential (primary) hypertension: Secondary | ICD-10-CM | POA: Diagnosis not present

## 2019-07-30 DIAGNOSIS — Z90710 Acquired absence of both cervix and uterus: Secondary | ICD-10-CM | POA: Diagnosis not present

## 2019-07-30 DIAGNOSIS — Z510 Encounter for antineoplastic radiation therapy: Secondary | ICD-10-CM | POA: Diagnosis not present

## 2019-07-30 DIAGNOSIS — C7951 Secondary malignant neoplasm of bone: Secondary | ICD-10-CM | POA: Diagnosis not present

## 2019-07-30 DIAGNOSIS — Z87891 Personal history of nicotine dependence: Secondary | ICD-10-CM | POA: Diagnosis not present

## 2019-07-30 DIAGNOSIS — J449 Chronic obstructive pulmonary disease, unspecified: Secondary | ICD-10-CM | POA: Diagnosis not present

## 2019-07-30 LAB — CBC WITH DIFFERENTIAL/PLATELET
Abs Immature Granulocytes: 0.28 10*3/uL — ABNORMAL HIGH (ref 0.00–0.07)
Basophils Absolute: 0 10*3/uL (ref 0.0–0.1)
Basophils Relative: 0 %
Eosinophils Absolute: 0 10*3/uL (ref 0.0–0.5)
Eosinophils Relative: 0 %
HCT: 45.9 % (ref 36.0–46.0)
Hemoglobin: 14.8 g/dL (ref 12.0–15.0)
Immature Granulocytes: 2 %
Lymphocytes Relative: 4 %
Lymphs Abs: 0.6 10*3/uL — ABNORMAL LOW (ref 0.7–4.0)
MCH: 30.9 pg (ref 26.0–34.0)
MCHC: 32.2 g/dL (ref 30.0–36.0)
MCV: 95.8 fL (ref 80.0–100.0)
Monocytes Absolute: 0.7 10*3/uL (ref 0.1–1.0)
Monocytes Relative: 5 %
Neutro Abs: 12.5 10*3/uL — ABNORMAL HIGH (ref 1.7–7.7)
Neutrophils Relative %: 89 %
Platelets: 247 10*3/uL (ref 150–400)
RBC: 4.79 MIL/uL (ref 3.87–5.11)
RDW: 13.9 % (ref 11.5–15.5)
WBC: 14.1 10*3/uL — ABNORMAL HIGH (ref 4.0–10.5)
nRBC: 0 % (ref 0.0–0.2)

## 2019-07-30 LAB — BASIC METABOLIC PANEL
Anion gap: 11 (ref 5–15)
BUN: 24 mg/dL — ABNORMAL HIGH (ref 8–23)
CO2: 31 mmol/L (ref 22–32)
Calcium: 9 mg/dL (ref 8.9–10.3)
Chloride: 93 mmol/L — ABNORMAL LOW (ref 98–111)
Creatinine, Ser: 0.62 mg/dL (ref 0.44–1.00)
GFR calc Af Amer: >60 mL/min (ref >60)
GFR calc non Af Amer: >60 mL/min (ref >60)
Glucose, Bld: 100 mg/dL — ABNORMAL HIGH (ref 70–99)
Potassium: 4.1 mmol/L (ref 3.5–5.1)
Sodium: 135 mmol/L (ref 135–145)

## 2019-07-30 MED ORDER — DEXAMETHASONE 2 MG PO TABS: 2.0000 mg | ORAL_TABLET | Freq: Two times a day (BID) | ORAL | 0 refills | Status: DC

## 2019-07-30 MED ORDER — ONDANSETRON HCL 8 MG PO TABS: ORAL_TABLET | ORAL | 1 refills | Status: AC

## 2019-07-30 MED ORDER — HYDROCODONE-ACETAMINOPHEN 5-325 MG PO TABS: 1.0000 | ORAL_TABLET | Freq: Four times a day (QID) | ORAL | 0 refills | Status: DC | PRN

## 2019-07-30 MED ORDER — PROCHLORPERAZINE MALEATE 10 MG PO TABS: 10.0000 mg | ORAL_TABLET | Freq: Four times a day (QID) | ORAL | 1 refills | Status: AC | PRN

## 2019-07-30 NOTE — Progress Notes (Signed)
[place] NOTE  Patient Care Team: Leone Haven, MD as PCP - General (Family Medicine) Caryl Bis Angela Adam, MD as Consulting Physician (Family Medicine) Christene Lye, MD (General Surgery) Telford Nab, RN as Oncology Nurse Navigator Clent Jacks, RN as Oncology Nurse Navigator  CHIEF COMPLAINTS/PURPOSE OF CONSULTATION: lung cancer   Oncology History Overview Note  # MARCH 2021-CT scan [incidental-hospital admission for COPD exacerbation] PET scan-2.5 cm left lingular lesion; hilar adenopathy; 5.2 cm; 2.5 cm-hypermetabolic masses in the liver/cirrhotic; L3 right pedicle lesion; PET scan-left upper lobe nodule; mediastinal hilar adenopathy; liver lesion; bone lesions.  July 11, 2019-MRI brain multiple brain lesions.  April 6-liver biopsy- SMALL CELL lung cancer.   #COPD [Dr. Gonzalez]/smoker  # NGS/MOLECULAR TESTS:    # PALLIATIVE CARE EVALUATION: Josh; 4/07.   # PAIN MANAGEMENT:    DIAGNOSIS: Small cell lung cancer  STAGE:IV         ;  GOALS: Palliative  CURRENT/MOST RECENT THERAPY :     Cancer of upper lobe of left lung ([place])  07/17/2019 Initial Diagnosis   Cancer of upper lobe of left lung ([facility])      HISTORY OF PRESENTING ILLNESS:  Jane Davis 86 y.o.  female metastatic/extensive stage small cell lung cancer is here for follow-up.  Patient finally decided to proceed with treatment with radiation.  Patient is currently getting radiation to the brain/also the spine.   Patient states her pain is improved.  She is taking dexamethasone 2 mg 3 times a day.  Appetite is good.  She is also taking hydrocodone.  She had episode of nausea with vomiting this morning.  Does not have any antiemetics at home.  Review of Systems  Constitutional: Positive for malaise/fatigue and weight loss. Negative for chills, diaphoresis and fever.  HENT: Negative for nosebleeds and sore throat.   Eyes: Negative for double vision.   Respiratory: Positive for cough, sputum production and shortness of breath. Negative for wheezing.   Cardiovascular: Negative for chest pain, palpitations, orthopnea and leg swelling.  Gastrointestinal: Negative for abdominal pain, blood in stool, constipation, diarrhea, heartburn, melena, nausea and vomiting.  Genitourinary: Negative for dysuria, frequency and urgency.  Musculoskeletal: Positive for joint pain. Negative for back pain.  Skin: Negative.  Negative for itching and rash.  Neurological: Negative for dizziness, tingling, focal weakness, weakness and headaches.  Endo/Heme/Allergies: Does not bruise/bleed easily.  Psychiatric/Behavioral: Negative for depression. The patient is not nervous/anxious and does not have insomnia.      MEDICAL HISTORY:  Past Medical History:  Diagnosis Date  . COPD (chronic obstructive pulmonary disease) ([place])   . Essential hypertension 03/22/2016  . Glaucoma   . Hypertension   . Hypothyroidism   . Hypoxia 02/11/2016   Overview:  Discharged from hospital on home oxygen.  . Palpitations 03/22/2016  . Prediabetes 02/19/2015  . Rectal lesion 05/29/2016  . Urge incontinence of urine 05/29/2016    SURGICAL HISTORY: Past Surgical History:  Procedure Laterality Date  . ABDOMINAL HYSTERECTOMY    . COLONOSCOPY  2013   [place]     SOCIAL HISTORY: Social History   Socioeconomic History  . Marital status: Widowed    Spouse name: Not on file  . Number of children: Not on file  . Years of education: Not on file  . Highest education level: Not on file  Occupational History  . Not on file  Tobacco Use  . Smoking status: Former Smoker    Packs/day: 1.00  Years: 40.00    Pack years: 40.00    Types: Cigarettes    Quit date: 01/11/2016    Years since quitting: 3.5  . Smokeless tobacco: Never Used  Substance and Sexual Activity  . Alcohol use: No  . Drug use: No  . Sexual activity: Never  Other Topics Concern  . Not on file  Social History  Narrative   Lives with son; in [place]. Hx of smoking [quit 3 years ago]; no heavy alcohol; home maker.    Social Determinants of Health   Financial Resource Strain:   . Difficulty of Paying Living Expenses:   Food Insecurity:   . Worried About Charity fundraiser in the Last Year:   . Arboriculturist in the Last Year:   Transportation Needs:   . Film/video editor (Medical):   Marland Kitchen Lack of Transportation (Non-Medical):   Physical Activity:   . Days of Exercise per Week:   . Minutes of Exercise per Session:   Stress:   . Feeling of Stress :   Social Connections:   . Frequency of Communication with Friends and Family:   . Frequency of Social Gatherings with Friends and Family:   . Attends Religious Services:   . Active Member of Clubs or Organizations:   . Attends Archivist Meetings:   Marland Kitchen Marital Status:   Intimate Partner Violence:   . Fear of Current or Ex-Partner:   . Emotionally Abused:   Marland Kitchen Physically Abused:   . Sexually Abused:     FAMILY HISTORY: Family History  Problem Relation Age of Onset  . Intracerebral hemorrhage Mother   . Cancer Sister        breast  . Cancer Brother        colon; liver  . Bladder Cancer Neg Hx   . Prostate cancer Neg Hx     ALLERGIES:  has No Known Allergies.  MEDICATIONS:  Current Outpatient Medications  Medication Sig Dispense Refill  . albuterol (VENTOLIN HFA) 108 (90 Base) MCG/ACT inhaler Inhale 2 puffs into the lungs every 6 (six) hours as needed for wheezing or shortness of breath. 8 g 0  . aspirin EC 81 MG tablet Take 81 mg by mouth daily.    Marland Kitchen dexamethasone (DECADRON) 2 MG tablet Take 1 tablet (2 mg total) by mouth 2 (two) times daily. 30 tablet 0  . HYDROcodone-acetaminophen (NORCO/VICODIN) 5-325 MG tablet Take 1 tablet by mouth every 6 (six) hours as needed for severe pain. 90 tablet 0  . latanoprost (XALATAN) 0.005 % ophthalmic solution Place 1 drop into both eyes at bedtime.     Marland Kitchen levothyroxine (SYNTHROID)  75 MCG tablet Take 1 tablet (75 mcg total) by mouth daily. 90 tablet 1  . olmesartan (BENICAR) 20 MG tablet Take 1 tablet (20 mg total) by mouth daily. 90 tablet 1  . Timolol Maleate 0.5 % (DAILY) SOLN Place 1 drop into the right eye daily.    . ondansetron (ZOFRAN) 8 MG tablet One pill every 8 hours as needed for nausea/vomitting. 40 tablet 1  . prochlorperazine (COMPAZINE) 10 MG tablet Take 1 tablet (10 mg total) by mouth every 6 (six) hours as needed for nausea or vomiting. 40 tablet 1   No current facility-administered medications for this visit.      Marland Kitchen  PHYSICAL EXAMINATION: ECOG PERFORMANCE STATUS: 1 - Symptomatic but completely ambulatory  Vitals:   07/30/19 1014  BP: 123/69  Pulse: 73  Resp: (!) 22  Temp: 97.9 F (36.6 C)  SpO2: 95%   Filed Weights   07/30/19 1014  Weight: 104 lb (47.2 kg)    Physical Exam  Constitutional: She is oriented to person, place, and time and well-developed, well-nourished, and in no distress.  Accompanied by her son.  HENT:  Head: Normocephalic and atraumatic.  Mouth/Throat: Oropharynx is clear and moist. No oropharyngeal exudate.  Eyes: Pupils are equal, round, and reactive to light.  Cardiovascular: Normal rate and regular rhythm.  Pulmonary/Chest: No respiratory distress. She has no wheezes.  Decreased air entry bilaterally.  No wheeze or crackles.  Abdominal: Soft. Bowel sounds are normal. She exhibits no distension and no mass. There is no abdominal tenderness. There is no rebound and no guarding.  Musculoskeletal:        General: No tenderness or edema. Normal range of motion.     Cervical back: Normal range of motion and neck supple.  Neurological: She is alert and oriented to person, place, and time.  Skin: Skin is warm.  Psychiatric: Affect normal.     LABORATORY DATA:  I have reviewed the data as listed Lab Results  Component Value Date   WBC 14.1 (H) 07/30/2019   HGB 14.8 07/30/2019   HCT 45.9 07/30/2019   MCV 95.8  07/30/2019   PLT 247 07/30/2019   Recent Labs    06/24/19 1723 06/25/19 0422 06/26/19 0859 07/07/19 1027 07/30/19 0957  NA 139   < > 134* 138 135  K 4.5   < > 4.4 3.8 4.1  CL 99   < > 93* 96* 93*  CO2 33*   < > 30 33* 31  GLUCOSE 114*   < > 155* 117* 100*  BUN 11   < > 10 13 24*  CREATININE 0.45   < > 0.60 0.57 0.62  CALCIUM 9.5   < > 9.5 9.8 9.0  GFRNONAA >60   < > >60 >60 >60  GFRAA >60   < > >60 >60 >60  PROT 7.8  --   --  8.2*  --   ALBUMIN 4.4  --   --  4.5  --   AST 23  --   --  22  --   ALT 13  --   --  13  --   ALKPHOS 83  --   --  82  --   BILITOT 0.6  --   --  0.7  --    < > = values in this interval not displayed.    RADIOGRAPHIC STUDIES: I have personally reviewed the radiological images as listed and agreed with the findings in the report. MR BRAIN W WO CONTRAST  Result Date: 07/11/2019 CLINICAL DATA:  New diagnosis lung mass.  Staging. EXAM: MRI HEAD WITHOUT AND WITH CONTRAST TECHNIQUE: Multiplanar, multiecho pulse sequences of the brain and surrounding structures were obtained without and with intravenous contrast. CONTRAST:  67mL GADAVIST GADOBUTROL 1 MMOL/ML IV SOLN COMPARISON:  None. FINDINGS: Brain: There are multiple intracranial metastatic lesions. The majority of these show poor contrast enhancement in are actually better seen on the diffusion imaging as foci of restricted diffusion. Lesions are identified using a combination of axial and coronal diffusion imaging as well as the postcontrast imaging. Lesions are as follows. 2 mm metastasis inferior medial right cerebellum. 6-7 mm metastasis left ventral pons. Question of a punctate metastasis at the cerebellar vermian tip. 2 mm metastasis in the right anterior thalamus. 2 mm metastasis in the left posterior  limb internal capsule. 3 mm metastasis at the medial right frontal lobe. 5 mm metastasis of the right parietal cortex. 7 mm metastasis at the right frontal cortex at the vertex. 3 mm metastasis of the left  frontal cortex at the vertex. 5 mm metastasis of the inferior temporal lobe on the right. 4 mm metastasis lateral surface of the left temporal lobe. 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 Cerebral hemispheres show mild to moderate chronic small-vessel changes of the white matter considering age. No hydrocephalus. No extra-axial collection. 4 mm metastasis of the inferior frontal lobe on the left. Vascular: Major vessels at the base of the brain show flow. Skull and upper cervical spine: No bone metastases identified. Sinuses/Orbits: Clear/normal Other: None IMPRESSION: At least 11 metastases scattered throughout the infratentorial and supratentorial brain as outlined above. Lesions show poor or no enhancement and are actually better seen on the diffusion imaging, therefore more difficult than usual to localize and documented, requiring utilization of both the axial and coronal diffusion imaging as well as the postcontrast T1 imaging. None of the lesions is large or associated with mass effect, hemorrhage or vasogenic edema. Electronically Signed   By: Nelson Chimes M.D.   On: 07/11/2019 13:10   MR Lumbar Spine W Wo Contrast  Result Date: 07/08/2019 CLINICAL DATA:  Left upper lobe pulmonary mass. EXAM: MRI LUMBAR SPINE WITHOUT AND WITH CONTRAST TECHNIQUE: Multiplanar and multiecho pulse sequences of the lumbar spine were obtained without and with intravenous contrast. CONTRAST:  76mL GADAVIST GADOBUTROL 1 MMOL/ML IV SOLN COMPARISON:  PET scan 06/05/2019 FINDINGS: Segmentation: 5 non rib-bearing lumbar type vertebral bodies are present. The lowest fully formed vertebral body is L5. Alignment: No significant listhesis is present. Rightward curvature is centered at L3. Vertebrae: Infiltrative mass is again noted at L3. This involves the right side of the vertebral body. There is diffuse infiltration of the posterior elements including the spinous process. Tumor is more prominent right than left.  Tumor extends into the epidural space and fills the right neural foramen at L3-4. Enhancing tumor mass measures 5.0 x 2.7 on axial images. Epidural extension measures cephalo caudad 3.2 cm, predominantly on the right. No other focal enhancement is present to suggest tumor elsewhere. Chronic endplate marrow changes are noted. Conus medullaris and cauda equina: Conus extends to the L1 level. Conus and cauda equina appear normal. Paraspinal and other soft tissues: A 1.4 cm simple cyst is present anteriorly in the right kidney. Solid organs are otherwise within normal limits. No significant adenopathy is present. Disc levels: L1-2: Negative. L2-3: Negative. L3-4: A broad-based disc protrusion is present. There is some tumor crow chin into the right neural foramen. The left neural foramen is patent. Tumor impacts the right subarticular recess is well. L4-5: Chronic loss of disc height is present. Mild broad-based bulging is present. Mild facet hypertrophy is noted bilaterally. No focal stenosis is evident. L5-S1: Asymmetric left-sided facet hypertrophy is noted. A broad-based disc protrusion is present. This results in mild left foraminal narrowing. IMPRESSION: 1. Infiltrative mass lesion involving the right side of the L3 vertebral body and posterior elements likely represents metastatic disease from the lung or liver primary. 2. Tumor impacts the right subarticular recess and right neural foramen at L3-4. 3. Mild left foraminal narrowing at L5-S1. 4. Mild broad-based disc bulging and facet hypertrophy at L4-5 without significant stenosis. Electronically Signed   By: San Morelle M.D.   On: 07/08/2019 12:28   NM PET Image Initial (  PI) Skull Base To Thigh  Result Date: 07/03/2019 CLINICAL DATA:  Initial treatment strategy for left upper lobe pulmonary mass. EXAM: NUCLEAR MEDICINE PET SKULL BASE TO THIGH TECHNIQUE: 5.9 mCi F-18 FDG was injected intravenously. Full-ring PET imaging was performed from the skull  base to thigh after the radiotracer. CT data was obtained and used for attenuation correction and anatomic localization. Fasting blood glucose: 76 mg/dl COMPARISON:  CTA 06/24/2019 FINDINGS: Mediastinal blood pool activity: SUV max 1.8 Liver activity: SUV max NA NECK: No hypermetabolic lymph nodes in the neck. Incidental CT findings: none CHEST: 4.0 x 2.3 x 1.9 cm left upper lobe pulmonary mass identified on recent chest CT is markedly hypermetabolic with SUV max = 67.6 hypermetabolic left hilar metastatic demonstrates SUV max = 6.8. no evidence for hypermetabolic mediastinal or right hilar metastases. Incidental CT findings: Coronary artery calcification is evident. Atherosclerotic calcification is noted in the wall of the thoracic aorta. Scattered tiny calcified and noncalcified pulmonary nodules better evaluated on the recent diagnostic CT chest. ABDOMEN/PELVIS: 5.2 cm lesion in the inferior left liver is markedly hypermetabolic with SUV max = 19.5. A second subtle liver lesion on noncontrast CT imaging measures 2.3 cm in the anterior right liver with SUV max = 12.5. No hypermetabolic lymphadenopathy in the abdomen or pelvis. Relatively long segment non focal uptake in the right colon is presumably physiologic with no underlying colonic mass lesion evident by CT. Incidental CT findings: There is a subtle suggestion of contour nodularity in the posterior right liver (see axial 151/3) raising the question of cirrhosis. There is abdominal aortic atherosclerosis without aneurysm. SKELETON: A large hypermetabolic lesion is identified at the level of L3, involving the right posterolateral L3 vertebral body, right pedicle, transverse process, lamina and spinous process. Subtle soft tissue component is identified in the right L3-4 foramen, right aspect of the spinal canal and in the right paraspinal muscles at this level. SUV max = 14.5 Incidental CT findings: none IMPRESSION: 1. Left upper lobe pulmonary mass is  markedly hypermetabolic and is associated with hypermetabolic left hilar lymphadenopathy, large hypermetabolic liver lesions and a hypermetabolic L3 lesion that is amazingly subtle on CT imaging relative to its large size. Primary bronchogenic neoplasm with metastatic disease to the left hilum, liver, and lumbar spine would be a consideration. Given the suggestion of cirrhotic liver morphology and the liver and spinal lesions being larger than the lung lesion, primary hepatocellular carcinoma with metastatic disease to the lung and spine would also be a consideration. 2. The large L3 lesion involves the right L3 neuroforamen and right epidural space. Lumbar spine MRI with and without contrast may be warranted to further evaluate. These results will be called to the ordering clinician or representative by the Radiologist Assistant, and communication documented in the PACS or [employer]. Electronically Signed   By: Misty Stanley M.D.   On: 07/03/2019 13:10   US BIOPSY (LIVER)  Result Date: 07/14/2019 INDICATION: No known primary, now with hypermetabolic liver lesions worrisome for metastatic disease. Please from ultrasound-guided biopsy for tissue diagnostic purposes. EXAM: ULTRASOUND GUIDED LIVER LESION BIOPSY COMPARISON:  PET-CT-07/03/2019 MEDICATIONS: None ANESTHESIA/SEDATION: Fentanyl 50 mcg IV; Versed 1 mg IV Total Moderate Sedation time:  10 Minutes. The patient's level of consciousness and vital signs were monitored continuously by radiology nursing throughout the procedure under my direct supervision. COMPLICATIONS: None immediate. PROCEDURE: Informed written consent was obtained from the patient after a discussion of the risks, benefits and alternatives to treatment. The patient understands and consents  the procedure. A timeout was performed prior to the initiation of the procedure. Ultrasound scanning was performed of the right upper abdominal quadrant demonstrates an approximately 5.6 x 5.5 cm  isoechoic mass within the left lobe of the liver correlating with the dominant hypermetabolic lesion seen on preceding PET-CT image 143, series 3. The procedure was planned. The midline of the abdomen was prepped and draped in the usual sterile fashion. The overlying soft tissues were anesthetized with 1% lidocaine with epinephrine. A 17 gauge, 6.8 cm co-axial needle was advanced into a peripheral aspect of the lesion. This was followed by 5 core biopsies with an 18 gauge core device under direct ultrasound guidance. The coaxial needle tract was embolized with a small amount of Gel-Foam slurry and superficial hemostasis was obtained with manual compression. Post procedural scanning was negative for definitive area of hemorrhage or additional complication. A dressing was placed. The patient tolerated the procedure well without immediate post procedural complication. IMPRESSION: Technically successful ultrasound guided core needle biopsy of dominant hypermetabolic mass within the left lobe of the liver. Electronically Signed   By: Sandi Mariscal M.D.   On: 07/14/2019 10:04   DG FEMUR, MIN 2 VIEWS RIGHT  Result Date: 07/07/2019 CLINICAL DATA:  Distal right femur pain. EXAM: RIGHT FEMUR 2 VIEWS COMPARISON:  None. FINDINGS: No bone abnormality. No abnormal soft tissue finding other than some ordinary arterial calcification, less than often seen at this age. No significant knee joint pathology is seen. IMPRESSION: Negative radiography. Electronically Signed   By: Nelson Chimes M.D.   On: 07/07/2019 16:28    ASSESSMENT & PLAN:   Cancer of upper lobe of left lung ([place]) # Small cell lung cancer-stage IV extensive stage-liver/bone; s/p liver biopsy; MRI brain-multiple brain lesions.  March 2021-PET scan shows a large liver lesion bone lesions; left lung nodule.  #Continue whole brain radiation tolerating fairly well except for mild nausea/see below.   #Discussed the systemic chemotherapy-carbo etoposide plus  Tecentriq.  Reviewed the schedule.  Also discussed regarding restaging scans.  Patient family to decide and let us know.  #Nausea-question radiation.  Recommend Compazine/Zofran.  #Right hip pain/bone mets L3-await patient's decision to proceed with radiation.  S/p Zometa; again to schedule in 4 weeks.  #Prognosis: Understands overall prognosis continues to be poor.  Discussed that with treatment-median life expectancy in the order of 8 to 12 months.  Without treatment-life expectancy in the order of few months.  # DISPOSITION: # follow up in 1st week/ of May 2021;MD-labs- cbc/cmp-Dr.B  # I reviewed the blood work- with the patient in detail; also reviewed the imaging independently [as summarized above]; and with the patient in detail.    Cc; Dr.Crsytal.  All questions were answered. The patient knows to call the clinic with any problems, questions or concerns.    Cammie Sickle, MD 07/30/2019 1:56 PM

## 2019-07-30 NOTE — Assessment & Plan Note (Addendum)
#   Small cell lung cancer-stage IV extensive stage-liver/bone; s/p liver biopsy; MRI brain-multiple brain lesions.  March 2021-PET scan shows a large liver lesion bone lesions; left lung nodule.  #Continue whole brain radiation tolerating fairly well except for mild nausea/see below.   #Discussed the systemic chemotherapy-carbo etoposide plus Tecentriq.  Reviewed the schedule.  Also discussed regarding restaging scans.  Patient family to decide and let us know.  #Nausea-question radiation.  Recommend Compazine/Zofran.  #Right hip pain/bone mets L3-await patient's decision to proceed with radiation.  S/p Zometa; again to schedule in 4 weeks.  Reduce dexamethasone to 2 mg twice daily.  New prescription for hydrocodone given.  #Prognosis: Patient and son understands overall prognosis continues to be poor.  Discussed that with treatment-median life expectancy in the order of 8 to 12 months.  Without treatment-life expectancy in the order of few months.  Also discussed with patient's daughter Butch Penny.  # DISPOSITION: # follow up in 1st week/ of May 2021;MD-labs- cbc/cmp-Dr.B  # I reviewed the blood work- with the patient in detail; also reviewed the imaging independently [as summarized above]; and with the patient in detail.    Cc; Dr.Crsytal.

## 2019-07-30 NOTE — Progress Notes (Signed)
[place]  Telephone:([phone] [phone]) [phone]   Name: Jane Davis Date: 07/30/2019 MRN: [id number]  DOB: Feb 14, 1934  Patient Care Team: Leone Haven, MD as PCP - General (Family Medicine) Caryl Bis Angela Adam, MD as Consulting Physician (Family Medicine) Christene Lye, MD (General Surgery) Telford Nab, RN as Oncology Nurse Navigator Clent Jacks, RN as Oncology Nurse Navigator    REASON FOR CONSULTATION: Jane Davis is a 86 y.o. female with multiple medical problems including COPD, hypertension, and tobacco abuse, who was hospitalized 06/24/2019-06/26/2019 with COPD exacerbation.  Patient was incidentally found to have a left lung mass concerning for malignancy further work-up including PET scan revealed metastatic disease involving the lung, liver, and L3.  Patient was also found to have multiple brain metastases on MRI.  She underwent liver biopsy on 07/14/2019 with pathology showing small cell.  She was referred to palliative care to help address goals and manage ongoing symptoms.  SOCIAL HISTORY:     reports that she quit smoking about 3 years ago. Her smoking use included cigarettes. She has a 40.00 pack-year smoking history. She has never used smokeless tobacco. She reports that she does not drink alcohol or use drugs.   Patient is widowed for the past 7 years.  She lives in [place] with her son.  In total, she has 3 daughters and 2 sons, most of whom live out of state.  Patient lived in a variety of places as her husband was in the [employer].  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  ADVANCE DIRECTIVES:  On file -patient son Jane Davis) and daughter Jane Davis) are her Jane Davis.  Jane Davis lives in [place], [place]  CODE STATUS:   PAST MEDICAL HISTORY: Past Medical History:  Diagnosis Date  . COPD (chronic obstructive pulmonary disease) ([place])   .  Essential hypertension 03/22/2016  . Glaucoma   . Hypertension   . Hypothyroidism   . Hypoxia 02/11/2016   Overview:  Discharged from hospital on home oxygen.  . Palpitations 03/22/2016  . Prediabetes 02/19/2015  . Rectal lesion 05/29/2016  . Urge incontinence of urine 05/29/2016    PAST SURGICAL HISTORY:  Past Surgical History:  Procedure Laterality Date  . ABDOMINAL HYSTERECTOMY    . COLONOSCOPY  2013   [place] HISTORY:  Oncology History Overview Note  # MARCH 2021-CT scan [incidental-hospital admission for COPD exacerbation] PET scan-2.5 cm left lingular lesion; hilar adenopathy; 5.2 cm; 2.5 cm-hypermetabolic masses in the liver/cirrhotic; L3 right pedicle lesion; PET scan-left upper lobe nodule; mediastinal hilar adenopathy; liver lesion; bone lesions.  July 11, 2019-MRI brain multiple brain lesions.  April 6-liver biopsy- SMALL CELL lung cancer.   #COPD [Dr. Gonzalez]/smoker  # NGS/MOLECULAR TESTS:    # PALLIATIVE CARE EVALUATION: Josh; 4/07.   # PAIN MANAGEMENT:    DIAGNOSIS: Small cell lung cancer  STAGE:IV         ;  GOALS: Palliative  CURRENT/MOST RECENT THERAPY :     Cancer of upper lobe of left lung ([facility])  07/17/2019 Initial Diagnosis   Cancer of upper lobe of left lung ([facility])     ALLERGIES:  has No Known Allergies.  MEDICATIONS:  Current Outpatient Medications  Medication Sig Dispense Refill  . albuterol (VENTOLIN HFA) 108 (90 Base) MCG/ACT inhaler Inhale 2 puffs into the lungs every 6 (six) hours as needed for wheezing or shortness of breath. 8  g 0  . aspirin EC 81 MG tablet Take 81 mg by mouth daily.    Marland Kitchen dexamethasone (DECADRON) 2 MG tablet Take 1 tablet (2 mg total) by mouth 2 (two) times daily. 30 tablet 0  . HYDROcodone-acetaminophen (NORCO/VICODIN) 5-325 MG tablet Take 1 tablet by mouth every 6 (six) hours as needed for severe pain. 90 tablet 0  . latanoprost (XALATAN) 0.005 % ophthalmic solution Place 1 drop into both  eyes at bedtime.     Marland Kitchen levothyroxine (SYNTHROID) 75 MCG tablet Take 1 tablet (75 mcg total) by mouth daily. 90 tablet 1  . olmesartan (BENICAR) 20 MG tablet Take 1 tablet (20 mg total) by mouth daily. 90 tablet 1  . ondansetron (ZOFRAN) 8 MG tablet One pill every 8 hours as needed for nausea/vomitting. 40 tablet 1  . prochlorperazine (COMPAZINE) 10 MG tablet Take 1 tablet (10 mg total) by mouth every 6 (six) hours as needed for nausea or vomiting. 40 tablet 1  . Timolol Maleate 0.5 % (DAILY) SOLN Place 1 drop into the right eye daily.     No current facility-administered medications for this visit.    VITAL SIGNS: There were no vitals taken for this visit. There were no vitals filed for this visit.  Estimated body mass index is 20.31 kg/m as calculated from the following:   Height as of an earlier encounter on 07/30/19: 5' (1.524 m).   Weight as of an earlier encounter on 07/30/19: 104 lb (47.2 kg).  LABS: CBC:    Component Value Date/Time   WBC 14.1 (H) 07/30/2019 0957   HGB 14.8 07/30/2019 0957   HCT 45.9 07/30/2019 0957   PLT 247 07/30/2019 0957   MCV 95.8 07/30/2019 0957   NEUTROABS 12.5 (H) 07/30/2019 0957   LYMPHSABS 0.6 (L) 07/30/2019 0957   MONOABS 0.7 07/30/2019 0957   EOSABS 0.0 07/30/2019 0957   BASOSABS 0.0 07/30/2019 0957   Comprehensive Metabolic Panel:    Component Value Date/Time   NA 135 07/30/2019 0957   K 4.1 07/30/2019 0957   CL 93 (L) 07/30/2019 0957   CO2 31 07/30/2019 0957   BUN 24 (H) 07/30/2019 0957   CREATININE 0.62 07/30/2019 0957   GLUCOSE 100 (H) 07/30/2019 0957   CALCIUM 9.0 07/30/2019 0957   AST 22 07/07/2019 1027   ALT 13 07/07/2019 1027   ALKPHOS 82 07/07/2019 1027   BILITOT 0.7 07/07/2019 1027   PROT 8.2 (H) 07/07/2019 1027   ALBUMIN 4.5 07/07/2019 1027    RADIOGRAPHIC STUDIES: MR BRAIN W WO CONTRAST  Result Date: 07/11/2019 CLINICAL DATA:  New diagnosis lung mass.  Staging. EXAM: MRI HEAD WITHOUT AND WITH CONTRAST TECHNIQUE:  Multiplanar, multiecho pulse sequences of the brain and surrounding structures were obtained without and with intravenous contrast. CONTRAST:  41m GADAVIST GADOBUTROL 1 MMOL/ML IV SOLN COMPARISON:  None. FINDINGS: Brain: There are multiple intracranial metastatic lesions. The majority of these show poor contrast enhancement in are actually better seen on the diffusion imaging as foci of restricted diffusion. Lesions are identified using a combination of axial and coronal diffusion imaging as well as the postcontrast imaging. Lesions are as follows. 2 mm metastasis inferior medial right cerebellum. 6-7 mm metastasis left ventral pons. Question of a punctate metastasis at the cerebellar vermian tip. 2 mm metastasis in the right anterior thalamus. 2 mm metastasis in the left posterior limb internal capsule. 3 mm metastasis at the medial right frontal lobe. 5 mm metastasis of the right parietal cortex. 7 mm  metastasis at the right frontal cortex at the vertex. 3 mm metastasis of the left frontal cortex at the vertex. 5 mm metastasis of the inferior temporal lobe on the right. 4 mm metastasis lateral surface of the left temporal lobe. 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 Cerebral hemispheres show mild to moderate chronic small-vessel changes of the white matter considering age. No hydrocephalus. No extra-axial collection. 4 mm metastasis of the inferior frontal lobe on the left. Vascular: Major vessels at the base of the brain show flow. Skull and upper cervical spine: No bone metastases identified. Sinuses/Orbits: Clear/normal Other: None IMPRESSION: At least 11 metastases scattered throughout the infratentorial and supratentorial brain as outlined above. Lesions show poor or no enhancement and are actually better seen on the diffusion imaging, therefore more difficult than usual to localize and documented, requiring utilization of both the axial and coronal diffusion imaging as well as the  postcontrast T1 imaging. None of the lesions is large or associated with mass effect, hemorrhage or vasogenic edema. Electronically Signed   By: Nelson Chimes M.D.   On: 07/11/2019 13:10   MR Lumbar Spine W Wo Contrast  Result Date: 07/08/2019 CLINICAL DATA:  Left upper lobe pulmonary mass. EXAM: MRI LUMBAR SPINE WITHOUT AND WITH CONTRAST TECHNIQUE: Multiplanar and multiecho pulse sequences of the lumbar spine were obtained without and with intravenous contrast. CONTRAST:  42m GADAVIST GADOBUTROL 1 MMOL/ML IV SOLN COMPARISON:  PET scan 06/05/2019 FINDINGS: Segmentation: 5 non rib-bearing lumbar type vertebral bodies are present. The lowest fully formed vertebral body is L5. Alignment: No significant listhesis is present. Rightward curvature is centered at L3. Vertebrae: Infiltrative mass is again noted at L3. This involves the right side of the vertebral body. There is diffuse infiltration of the posterior elements including the spinous process. Tumor is more prominent right than left. Tumor extends into the epidural space and fills the right neural foramen at L3-4. Enhancing tumor mass measures 5.0 x 2.7 on axial images. Epidural extension measures cephalo caudad 3.2 cm, predominantly on the right. No other focal enhancement is present to suggest tumor elsewhere. Chronic endplate marrow changes are noted. Conus medullaris and cauda equina: Conus extends to the L1 level. Conus and cauda equina appear normal. Paraspinal and other soft tissues: A 1.4 cm simple cyst is present anteriorly in the right kidney. Solid organs are otherwise within normal limits. No significant adenopathy is present. Disc levels: L1-2: Negative. L2-3: Negative. L3-4: A broad-based disc protrusion is present. There is some tumor crow chin into the right neural foramen. The left neural foramen is patent. Tumor impacts the right subarticular recess is well. L4-5: Chronic loss of disc height is present. Mild broad-based bulging is present.  Mild facet hypertrophy is noted bilaterally. No focal stenosis is evident. L5-S1: Asymmetric left-sided facet hypertrophy is noted. A broad-based disc protrusion is present. This results in mild left foraminal narrowing. IMPRESSION: 1. Infiltrative mass lesion involving the right side of the L3 vertebral body and posterior elements likely represents metastatic disease from the lung or liver primary. 2. Tumor impacts the right subarticular recess and right neural foramen at L3-4. 3. Mild left foraminal narrowing at L5-S1. 4. Mild broad-based disc bulging and facet hypertrophy at L4-5 without significant stenosis. Electronically Signed   By: CSan MorelleM.D.   On: 07/08/2019 12:28   NM PET Image Initial (PI) Skull Base To Thigh  Result Date: 07/03/2019 CLINICAL DATA:  Initial treatment strategy for left upper lobe pulmonary mass. EXAM:  NUCLEAR MEDICINE PET SKULL BASE TO THIGH TECHNIQUE: 5.9 mCi F-18 FDG was injected intravenously. Full-ring PET imaging was performed from the skull base to thigh after the radiotracer. CT data was obtained and used for attenuation correction and anatomic localization. Fasting blood glucose: 76 mg/dl COMPARISON:  CTA 06/24/2019 FINDINGS: Mediastinal blood pool activity: SUV max 1.8 Liver activity: SUV max NA NECK: No hypermetabolic lymph nodes in the neck. Incidental CT findings: none CHEST: 4.0 x 2.3 x 1.9 cm left upper lobe pulmonary mass identified on recent chest CT is markedly hypermetabolic with SUV max = 97.3 hypermetabolic left hilar metastatic demonstrates SUV max = 6.8. no evidence for hypermetabolic mediastinal or right hilar metastases. Incidental CT findings: Coronary artery calcification is evident. Atherosclerotic calcification is noted in the wall of the thoracic aorta. Scattered tiny calcified and noncalcified pulmonary nodules better evaluated on the recent diagnostic CT chest. ABDOMEN/PELVIS: 5.2 cm lesion in the inferior left liver is markedly  hypermetabolic with SUV max = 53.2. A second subtle liver lesion on noncontrast CT imaging measures 2.3 cm in the anterior right liver with SUV max = 12.5. No hypermetabolic lymphadenopathy in the abdomen or pelvis. Relatively long segment non focal uptake in the right colon is presumably physiologic with no underlying colonic mass lesion evident by CT. Incidental CT findings: There is a subtle suggestion of contour nodularity in the posterior right liver (see axial 151/3) raising the question of cirrhosis. There is abdominal aortic atherosclerosis without aneurysm. SKELETON: A large hypermetabolic lesion is identified at the level of L3, involving the right posterolateral L3 vertebral body, right pedicle, transverse process, lamina and spinous process. Subtle soft tissue component is identified in the right L3-4 foramen, right aspect of the spinal canal and in the right paraspinal muscles at this level. SUV max = 14.5 Incidental CT findings: none IMPRESSION: 1. Left upper lobe pulmonary mass is markedly hypermetabolic and is associated with hypermetabolic left hilar lymphadenopathy, large hypermetabolic liver lesions and a hypermetabolic L3 lesion that is amazingly subtle on CT imaging relative to its large size. Primary bronchogenic neoplasm with metastatic disease to the left hilum, liver, and lumbar spine would be a consideration. Given the suggestion of cirrhotic liver morphology and the liver and spinal lesions being larger than the lung lesion, primary hepatocellular carcinoma with metastatic disease to the lung and spine would also be a consideration. 2. The large L3 lesion involves the right L3 neuroforamen and right epidural space. Lumbar spine MRI with and without contrast may be warranted to further evaluate. These results will be called to the ordering clinician or representative by the Radiologist Assistant, and communication documented in the PACS or [employer]. Electronically Signed   By: Misty Stanley M.D.   On: 07/03/2019 13:10   US BIOPSY (LIVER)  Result Date: 07/14/2019 INDICATION: No known primary, now with hypermetabolic liver lesions worrisome for metastatic disease. Please from ultrasound-guided biopsy for tissue diagnostic purposes. EXAM: ULTRASOUND GUIDED LIVER LESION BIOPSY COMPARISON:  PET-CT-07/03/2019 MEDICATIONS: None ANESTHESIA/SEDATION: Fentanyl 50 mcg IV; Versed 1 mg IV Total Moderate Sedation time:  10 Minutes. The patient's level of consciousness and vital signs were monitored continuously by radiology nursing throughout the procedure under my direct supervision. COMPLICATIONS: None immediate. PROCEDURE: Informed written consent was obtained from the patient after a discussion of the risks, benefits and alternatives to treatment. The patient understands and consents the procedure. A timeout was performed prior to the initiation of the procedure. Ultrasound scanning was performed of the right upper abdominal  quadrant demonstrates an approximately 5.6 x 5.5 cm isoechoic mass within the left lobe of the liver correlating with the dominant hypermetabolic lesion seen on preceding PET-CT image 143, series 3. The procedure was planned. The midline of the abdomen was prepped and draped in the usual sterile fashion. The overlying soft tissues were anesthetized with 1% lidocaine with epinephrine. A 17 gauge, 6.8 cm co-axial needle was advanced into a peripheral aspect of the lesion. This was followed by 5 core biopsies with an 18 gauge core device under direct ultrasound guidance. The coaxial needle tract was embolized with a small amount of Gel-Foam slurry and superficial hemostasis was obtained with manual compression. Post procedural scanning was negative for definitive area of hemorrhage or additional complication. A dressing was placed. The patient tolerated the procedure well without immediate post procedural complication. IMPRESSION: Technically successful ultrasound guided core needle  biopsy of dominant hypermetabolic mass within the left lobe of the liver. Electronically Signed   By: Sandi Mariscal M.D.   On: 07/14/2019 10:04   DG FEMUR, MIN 2 VIEWS RIGHT  Result Date: 07/07/2019 CLINICAL DATA:  Distal right femur pain. EXAM: RIGHT FEMUR 2 VIEWS COMPARISON:  None. FINDINGS: No bone abnormality. No abnormal soft tissue finding other than some ordinary arterial calcification, less than often seen at this age. No significant knee joint pathology is seen. IMPRESSION: Negative radiography. Electronically Signed   By: Nelson Chimes M.D.   On: 07/07/2019 16:28    PERFORMANCE STATUS (ECOG) : 1 - Symptomatic but completely ambulatory  Review of Systems  Constitutional: Negative for activity change and appetite change.  Respiratory: Negative for shortness of breath.   Gastrointestinal: Positive for nausea and vomiting. Negative for abdominal pain.   Unless otherwise noted, a complete review of systems is negative.  Physical Exam Constitutional:      Appearance: Normal appearance.  Pulmonary:     Effort: Pulmonary effort is normal.  Skin:    General: Skin is warm and dry.  Neurological:     General: No focal deficit present.     Mental Status: She is alert and oriented to person, place, and time.  Psychiatric:        Mood and Affect: Mood normal.      IMPRESSION: Routine follow-up with patient.  She was accompanied by her son.  Patient continues to receive RT.  She met today with Dr. Rogue Bussing and is considering initiation of systemic treatment.  Patient has follow-up with him in 2 weeks, where she will further discuss options and make a decision about how to proceed.  Symptomatically, patient has occasional pain, which is relieved with use of Norco as needed.  She reports an episode of nausea/vomiting.  She has been prescribed ondansetron for use as needed.  She endorses constipation.  We discussed adjustment of her bowel regimen.  Otherwise, she denies any  significant changes or concerns today.  Appreciate home-based palliative care support.  We reviewed a MOST form today, which patient and her family took home to discuss.  PLAN: -Continue current scope of treatment -Continue as needed Norco -Increase bowel regimen -MOST form reviewed -Follow-up virtual visit in about a month   Patient expressed understanding and was in agreement with this plan. She also understands that She can call the clinic at any time with any questions, concerns, or complaints.     Time Total: 15 minutes  Visit consisted of counseling and education dealing with the complex and emotionally intense issues of symptom management and palliative  care in the setting of serious and potentially life-threatening illness.Greater than 50%  of this time was spent counseling and coordinating care related to the above assessment and plan.  Signed by: Altha Harm, PhD, NP-C

## 2019-07-31 ENCOUNTER — Ambulatory Visit
Admission: RE | Admit: 2019-07-31 | Discharge: 2019-07-31 | Disposition: A | Discharge status: Home / Self Care | Payer: Medicare HMO | Source: Ambulatory Visit | Attending: Radiation Oncology | Admitting: Radiation Oncology

## 2019-07-31 ENCOUNTER — Encounter: Payer: Self-pay | Admitting: *Deleted

## 2019-07-31 DIAGNOSIS — C7951 Secondary malignant neoplasm of bone: Secondary | ICD-10-CM | POA: Diagnosis not present

## 2019-07-31 DIAGNOSIS — Z510 Encounter for antineoplastic radiation therapy: Secondary | ICD-10-CM | POA: Diagnosis not present

## 2019-07-31 DIAGNOSIS — I10 Essential (primary) hypertension: Secondary | ICD-10-CM | POA: Diagnosis not present

## 2019-07-31 DIAGNOSIS — C7931 Secondary malignant neoplasm of brain: Secondary | ICD-10-CM | POA: Diagnosis not present

## 2019-07-31 DIAGNOSIS — J449 Chronic obstructive pulmonary disease, unspecified: Secondary | ICD-10-CM | POA: Diagnosis not present

## 2019-07-31 DIAGNOSIS — E039 Hypothyroidism, unspecified: Secondary | ICD-10-CM | POA: Diagnosis not present

## 2019-07-31 DIAGNOSIS — C3412 Malignant neoplasm of upper lobe, left bronchus or lung: Secondary | ICD-10-CM | POA: Diagnosis not present

## 2019-07-31 DIAGNOSIS — M549 Dorsalgia, unspecified: Secondary | ICD-10-CM | POA: Diagnosis not present

## 2019-07-31 DIAGNOSIS — C787 Secondary malignant neoplasm of liver and intrahepatic bile duct: Secondary | ICD-10-CM | POA: Diagnosis not present

## 2019-08-01 ENCOUNTER — Ambulatory Visit
Admission: RE | Admit: 2019-08-01 | Discharge: 2019-08-01 | Disposition: A | Discharge status: Home / Self Care | Payer: Medicare HMO | Source: Ambulatory Visit | Attending: Radiation Oncology | Admitting: Radiation Oncology

## 2019-08-01 DIAGNOSIS — C3412 Malignant neoplasm of upper lobe, left bronchus or lung: Secondary | ICD-10-CM | POA: Diagnosis not present

## 2019-08-01 DIAGNOSIS — I10 Essential (primary) hypertension: Secondary | ICD-10-CM | POA: Diagnosis not present

## 2019-08-01 DIAGNOSIS — M549 Dorsalgia, unspecified: Secondary | ICD-10-CM | POA: Diagnosis not present

## 2019-08-01 DIAGNOSIS — C787 Secondary malignant neoplasm of liver and intrahepatic bile duct: Secondary | ICD-10-CM | POA: Diagnosis not present

## 2019-08-01 DIAGNOSIS — J449 Chronic obstructive pulmonary disease, unspecified: Secondary | ICD-10-CM | POA: Diagnosis not present

## 2019-08-01 DIAGNOSIS — C7931 Secondary malignant neoplasm of brain: Secondary | ICD-10-CM | POA: Diagnosis not present

## 2019-08-01 DIAGNOSIS — Z510 Encounter for antineoplastic radiation therapy: Secondary | ICD-10-CM | POA: Diagnosis not present

## 2019-08-01 DIAGNOSIS — E039 Hypothyroidism, unspecified: Secondary | ICD-10-CM | POA: Diagnosis not present

## 2019-08-01 DIAGNOSIS — C7951 Secondary malignant neoplasm of bone: Secondary | ICD-10-CM | POA: Diagnosis not present

## 2019-08-04 ENCOUNTER — Ambulatory Visit
Admission: RE | Admit: 2019-08-04 | Discharge: 2019-08-04 | Disposition: A | Discharge status: Home / Self Care | Payer: Medicare HMO | Source: Ambulatory Visit | Attending: Radiation Oncology | Admitting: Radiation Oncology

## 2019-08-04 DIAGNOSIS — C7931 Secondary malignant neoplasm of brain: Secondary | ICD-10-CM | POA: Diagnosis not present

## 2019-08-04 DIAGNOSIS — E039 Hypothyroidism, unspecified: Secondary | ICD-10-CM | POA: Diagnosis not present

## 2019-08-04 DIAGNOSIS — M549 Dorsalgia, unspecified: Secondary | ICD-10-CM | POA: Diagnosis not present

## 2019-08-04 DIAGNOSIS — I10 Essential (primary) hypertension: Secondary | ICD-10-CM | POA: Diagnosis not present

## 2019-08-04 DIAGNOSIS — Z510 Encounter for antineoplastic radiation therapy: Secondary | ICD-10-CM | POA: Diagnosis not present

## 2019-08-04 DIAGNOSIS — C7951 Secondary malignant neoplasm of bone: Secondary | ICD-10-CM | POA: Diagnosis not present

## 2019-08-04 DIAGNOSIS — J449 Chronic obstructive pulmonary disease, unspecified: Secondary | ICD-10-CM | POA: Diagnosis not present

## 2019-08-04 DIAGNOSIS — C3412 Malignant neoplasm of upper lobe, left bronchus or lung: Secondary | ICD-10-CM | POA: Diagnosis not present

## 2019-08-04 DIAGNOSIS — C787 Secondary malignant neoplasm of liver and intrahepatic bile duct: Secondary | ICD-10-CM | POA: Diagnosis not present

## 2019-08-05 ENCOUNTER — Encounter: Payer: Self-pay | Admitting: *Deleted

## 2019-08-05 ENCOUNTER — Ambulatory Visit
Admission: RE | Admit: 2019-08-05 | Discharge: 2019-08-05 | Disposition: A | Discharge status: Home / Self Care | Payer: Medicare HMO | Source: Ambulatory Visit | Attending: Radiation Oncology | Admitting: Radiation Oncology

## 2019-08-05 DIAGNOSIS — J449 Chronic obstructive pulmonary disease, unspecified: Secondary | ICD-10-CM | POA: Diagnosis not present

## 2019-08-05 DIAGNOSIS — E039 Hypothyroidism, unspecified: Secondary | ICD-10-CM | POA: Diagnosis not present

## 2019-08-05 DIAGNOSIS — Z510 Encounter for antineoplastic radiation therapy: Secondary | ICD-10-CM | POA: Diagnosis not present

## 2019-08-05 DIAGNOSIS — I10 Essential (primary) hypertension: Secondary | ICD-10-CM | POA: Diagnosis not present

## 2019-08-05 DIAGNOSIS — C3412 Malignant neoplasm of upper lobe, left bronchus or lung: Secondary | ICD-10-CM | POA: Diagnosis not present

## 2019-08-05 DIAGNOSIS — M899 Disorder of bone, unspecified: Secondary | ICD-10-CM | POA: Diagnosis not present

## 2019-08-05 DIAGNOSIS — C787 Secondary malignant neoplasm of liver and intrahepatic bile duct: Secondary | ICD-10-CM | POA: Diagnosis not present

## 2019-08-05 DIAGNOSIS — C7951 Secondary malignant neoplasm of bone: Secondary | ICD-10-CM | POA: Diagnosis not present

## 2019-08-05 DIAGNOSIS — C7931 Secondary malignant neoplasm of brain: Secondary | ICD-10-CM | POA: Diagnosis not present

## 2019-08-05 DIAGNOSIS — C3490 Malignant neoplasm of unspecified part of unspecified bronchus or lung: Secondary | ICD-10-CM

## 2019-08-05 DIAGNOSIS — M549 Dorsalgia, unspecified: Secondary | ICD-10-CM | POA: Diagnosis not present

## 2019-08-06 ENCOUNTER — Ambulatory Visit
Admission: RE | Admit: 2019-08-06 | Discharge: 2019-08-06 | Disposition: A | Discharge status: Home / Self Care | Payer: Medicare HMO | Source: Ambulatory Visit | Attending: Radiation Oncology | Admitting: Radiation Oncology

## 2019-08-06 DIAGNOSIS — C7951 Secondary malignant neoplasm of bone: Secondary | ICD-10-CM | POA: Diagnosis not present

## 2019-08-06 DIAGNOSIS — E039 Hypothyroidism, unspecified: Secondary | ICD-10-CM | POA: Diagnosis not present

## 2019-08-06 DIAGNOSIS — I10 Essential (primary) hypertension: Secondary | ICD-10-CM | POA: Diagnosis not present

## 2019-08-06 DIAGNOSIS — C787 Secondary malignant neoplasm of liver and intrahepatic bile duct: Secondary | ICD-10-CM | POA: Diagnosis not present

## 2019-08-06 DIAGNOSIS — Z510 Encounter for antineoplastic radiation therapy: Secondary | ICD-10-CM | POA: Diagnosis not present

## 2019-08-06 DIAGNOSIS — C7931 Secondary malignant neoplasm of brain: Secondary | ICD-10-CM | POA: Diagnosis not present

## 2019-08-06 DIAGNOSIS — M549 Dorsalgia, unspecified: Secondary | ICD-10-CM | POA: Diagnosis not present

## 2019-08-06 DIAGNOSIS — J449 Chronic obstructive pulmonary disease, unspecified: Secondary | ICD-10-CM | POA: Diagnosis not present

## 2019-08-06 DIAGNOSIS — C3412 Malignant neoplasm of upper lobe, left bronchus or lung: Secondary | ICD-10-CM | POA: Diagnosis not present

## 2019-08-07 ENCOUNTER — Encounter: Payer: Self-pay | Admitting: *Deleted

## 2019-08-07 ENCOUNTER — Ambulatory Visit
Admission: RE | Admit: 2019-08-07 | Discharge: 2019-08-07 | Disposition: A | Discharge status: Home / Self Care | Payer: Medicare HMO | Source: Ambulatory Visit | Attending: Radiation Oncology | Admitting: Radiation Oncology

## 2019-08-07 DIAGNOSIS — M549 Dorsalgia, unspecified: Secondary | ICD-10-CM | POA: Diagnosis not present

## 2019-08-07 DIAGNOSIS — C787 Secondary malignant neoplasm of liver and intrahepatic bile duct: Secondary | ICD-10-CM | POA: Diagnosis not present

## 2019-08-07 DIAGNOSIS — Z510 Encounter for antineoplastic radiation therapy: Secondary | ICD-10-CM | POA: Diagnosis not present

## 2019-08-07 DIAGNOSIS — C3412 Malignant neoplasm of upper lobe, left bronchus or lung: Secondary | ICD-10-CM | POA: Diagnosis not present

## 2019-08-07 DIAGNOSIS — I10 Essential (primary) hypertension: Secondary | ICD-10-CM | POA: Diagnosis not present

## 2019-08-07 DIAGNOSIS — E039 Hypothyroidism, unspecified: Secondary | ICD-10-CM | POA: Diagnosis not present

## 2019-08-07 DIAGNOSIS — J449 Chronic obstructive pulmonary disease, unspecified: Secondary | ICD-10-CM | POA: Diagnosis not present

## 2019-08-07 DIAGNOSIS — C7951 Secondary malignant neoplasm of bone: Secondary | ICD-10-CM | POA: Diagnosis not present

## 2019-08-07 DIAGNOSIS — C7931 Secondary malignant neoplasm of brain: Secondary | ICD-10-CM | POA: Diagnosis not present

## 2019-08-08 ENCOUNTER — Ambulatory Visit
Admission: RE | Admit: 2019-08-08 | Discharge: 2019-08-08 | Disposition: A | Discharge status: Home / Self Care | Payer: Medicare HMO | Source: Ambulatory Visit | Attending: Radiation Oncology | Admitting: Radiation Oncology

## 2019-08-08 ENCOUNTER — Encounter: Payer: Self-pay | Admitting: Family Medicine

## 2019-08-08 DIAGNOSIS — Z510 Encounter for antineoplastic radiation therapy: Secondary | ICD-10-CM | POA: Diagnosis not present

## 2019-08-08 DIAGNOSIS — J449 Chronic obstructive pulmonary disease, unspecified: Secondary | ICD-10-CM | POA: Diagnosis not present

## 2019-08-08 DIAGNOSIS — M549 Dorsalgia, unspecified: Secondary | ICD-10-CM | POA: Diagnosis not present

## 2019-08-08 DIAGNOSIS — C3412 Malignant neoplasm of upper lobe, left bronchus or lung: Secondary | ICD-10-CM | POA: Diagnosis not present

## 2019-08-08 DIAGNOSIS — C7931 Secondary malignant neoplasm of brain: Secondary | ICD-10-CM | POA: Diagnosis not present

## 2019-08-08 DIAGNOSIS — E039 Hypothyroidism, unspecified: Secondary | ICD-10-CM | POA: Diagnosis not present

## 2019-08-08 DIAGNOSIS — I10 Essential (primary) hypertension: Secondary | ICD-10-CM | POA: Diagnosis not present

## 2019-08-08 DIAGNOSIS — C7951 Secondary malignant neoplasm of bone: Secondary | ICD-10-CM | POA: Diagnosis not present

## 2019-08-08 DIAGNOSIS — C787 Secondary malignant neoplasm of liver and intrahepatic bile duct: Secondary | ICD-10-CM | POA: Diagnosis not present

## 2019-08-09 DIAGNOSIS — J9601 Acute respiratory failure with hypoxia: Secondary | ICD-10-CM | POA: Diagnosis not present

## 2019-08-09 DIAGNOSIS — J449 Chronic obstructive pulmonary disease, unspecified: Secondary | ICD-10-CM | POA: Diagnosis not present

## 2019-08-11 ENCOUNTER — Ambulatory Visit
Admission: RE | Admit: 2019-08-11 | Discharge: 2019-08-11 | Disposition: A | Discharge status: Home / Self Care | Payer: Medicare HMO | Source: Ambulatory Visit | Attending: Radiation Oncology | Admitting: Radiation Oncology

## 2019-08-11 DIAGNOSIS — C3412 Malignant neoplasm of upper lobe, left bronchus or lung: Secondary | ICD-10-CM | POA: Insufficient documentation

## 2019-08-11 DIAGNOSIS — C7931 Secondary malignant neoplasm of brain: Secondary | ICD-10-CM | POA: Insufficient documentation

## 2019-08-11 DIAGNOSIS — M549 Dorsalgia, unspecified: Secondary | ICD-10-CM | POA: Diagnosis not present

## 2019-08-11 DIAGNOSIS — E039 Hypothyroidism, unspecified: Secondary | ICD-10-CM | POA: Insufficient documentation

## 2019-08-11 DIAGNOSIS — Z79899 Other long term (current) drug therapy: Secondary | ICD-10-CM | POA: Diagnosis not present

## 2019-08-11 DIAGNOSIS — C7951 Secondary malignant neoplasm of bone: Secondary | ICD-10-CM | POA: Diagnosis not present

## 2019-08-11 DIAGNOSIS — Z7982 Long term (current) use of aspirin: Secondary | ICD-10-CM | POA: Diagnosis not present

## 2019-08-11 DIAGNOSIS — J449 Chronic obstructive pulmonary disease, unspecified: Secondary | ICD-10-CM | POA: Diagnosis not present

## 2019-08-11 DIAGNOSIS — C787 Secondary malignant neoplasm of liver and intrahepatic bile duct: Secondary | ICD-10-CM | POA: Insufficient documentation

## 2019-08-11 DIAGNOSIS — I10 Essential (primary) hypertension: Secondary | ICD-10-CM | POA: Insufficient documentation

## 2019-08-11 DIAGNOSIS — Z87891 Personal history of nicotine dependence: Secondary | ICD-10-CM | POA: Diagnosis not present

## 2019-08-11 DIAGNOSIS — Z510 Encounter for antineoplastic radiation therapy: Secondary | ICD-10-CM | POA: Diagnosis not present

## 2019-08-12 ENCOUNTER — Encounter: Payer: Self-pay | Admitting: *Deleted

## 2019-08-13 ENCOUNTER — Encounter: Payer: Self-pay | Admitting: Internal Medicine

## 2019-08-13 ENCOUNTER — Inpatient Hospital Stay: Payer: Medicare HMO | Admitting: Internal Medicine

## 2019-08-13 ENCOUNTER — Encounter: Payer: Self-pay | Admitting: *Deleted

## 2019-08-13 ENCOUNTER — Inpatient Hospital Stay: Payer: Medicare HMO | Attending: Internal Medicine

## 2019-08-13 ENCOUNTER — Other Ambulatory Visit: Payer: Self-pay

## 2019-08-13 DIAGNOSIS — C7931 Secondary malignant neoplasm of brain: Secondary | ICD-10-CM | POA: Diagnosis not present

## 2019-08-13 DIAGNOSIS — C787 Secondary malignant neoplasm of liver and intrahepatic bile duct: Secondary | ICD-10-CM | POA: Diagnosis not present

## 2019-08-13 DIAGNOSIS — C7951 Secondary malignant neoplasm of bone: Secondary | ICD-10-CM | POA: Insufficient documentation

## 2019-08-13 DIAGNOSIS — C3412 Malignant neoplasm of upper lobe, left bronchus or lung: Secondary | ICD-10-CM | POA: Diagnosis not present

## 2019-08-13 DIAGNOSIS — Z87891 Personal history of nicotine dependence: Secondary | ICD-10-CM | POA: Insufficient documentation

## 2019-08-13 DIAGNOSIS — G893 Neoplasm related pain (acute) (chronic): Secondary | ICD-10-CM | POA: Diagnosis not present

## 2019-08-13 LAB — COMPREHENSIVE METABOLIC PANEL
ALT: 19 U/L (ref 0–44)
AST: 18 U/L (ref 15–41)
Albumin: 3.3 g/dL — ABNORMAL LOW (ref 3.5–5.0)
Alkaline Phosphatase: 47 U/L (ref 38–126)
Anion gap: 8 (ref 5–15)
BUN: 20 mg/dL (ref 8–23)
CO2: 29 mmol/L (ref 22–32)
Calcium: 8.2 mg/dL — ABNORMAL LOW (ref 8.9–10.3)
Chloride: 97 mmol/L — ABNORMAL LOW (ref 98–111)
Creatinine, Ser: 0.55 mg/dL (ref 0.44–1.00)
GFR calc Af Amer: >60 mL/min (ref >60)
GFR calc non Af Amer: >60 mL/min (ref >60)
Glucose, Bld: 110 mg/dL — ABNORMAL HIGH (ref 70–99)
Potassium: 4.3 mmol/L (ref 3.5–5.1)
Sodium: 134 mmol/L — ABNORMAL LOW (ref 135–145)
Total Bilirubin: 0.8 mg/dL (ref 0.3–1.2)
Total Protein: 5.9 g/dL — ABNORMAL LOW (ref 6.5–8.1)

## 2019-08-13 LAB — CBC WITH DIFFERENTIAL/PLATELET
Abs Immature Granulocytes: 0.22 10*3/uL — ABNORMAL HIGH (ref 0.00–0.07)
Basophils Absolute: 0 10*3/uL (ref 0.0–0.1)
Basophils Relative: 0 %
Eosinophils Absolute: 0 10*3/uL (ref 0.0–0.5)
Eosinophils Relative: 1 %
HCT: 45.8 % (ref 36.0–46.0)
Hemoglobin: 14.8 g/dL (ref 12.0–15.0)
Immature Granulocytes: 4 %
Lymphocytes Relative: 8 %
Lymphs Abs: 0.4 10*3/uL — ABNORMAL LOW (ref 0.7–4.0)
MCH: 31 pg (ref 26.0–34.0)
MCHC: 32.3 g/dL (ref 30.0–36.0)
MCV: 96 fL (ref 80.0–100.0)
Monocytes Absolute: 0.5 10*3/uL (ref 0.1–1.0)
Monocytes Relative: 10 %
Neutro Abs: 3.8 10*3/uL (ref 1.7–7.7)
Neutrophils Relative %: 77 %
Platelets: 149 10*3/uL — ABNORMAL LOW (ref 150–400)
RBC: 4.77 MIL/uL (ref 3.87–5.11)
RDW: 14.4 % (ref 11.5–15.5)
WBC: 5 10*3/uL (ref 4.0–10.5)
nRBC: 0 % (ref 0.0–0.2)

## 2019-08-13 MED ORDER — DEXAMETHASONE 2 MG PO TABS: 2.0000 mg | ORAL_TABLET | Freq: Two times a day (BID) | ORAL | 0 refills | Status: DC

## 2019-08-13 NOTE — Progress Notes (Signed)
  Oncology Nurse Navigator Documentation  Navigator Location: [facility] On[facility] (08/13/19 1100)   )Navigator Encounter Type: Follow-up Appt (08/13/19 1100)                     Patient Visit Type: MedOnc (08/13/19 1100)   Barriers/Navigation Needs: No Barriers At This Time (08/13/19 1100)   Interventions: Referrals (08/13/19 1100) Referrals: Other(hospice) (08/13/19 1100)           met with patient and her son during follow up visit with Dr. Rogue Bussing today to discuss pursuing treatment. All questions answered during visit. Spoke with Josh to order hospice for patient. Pt's son made aware to expect call from hospice to schedule initial assessment. Informed to call if has any further questions or needs. Pt's son verbalized understanding. Nothing further needed at this time.          Time Spent with Patient: 30 (08/13/19 1100)

## 2019-08-13 NOTE — Assessment & Plan Note (Addendum)
#   Small cell lung cancer-stage IV extensive stage-liver/bone; s/p liver biopsy; MRI brain-multiple brain lesions.  March 2021-PET scan shows a large liver lesion bone lesions; left lung nodule.  #Patient currently status post whole brain radiation for brain metastases-tolerating poorly-memory deficits/fatigue-see below].  Discussed the role of systemic therapy-chemoimmunotherapy.  However patient declined systemic therapy as she is concerned about the potential side effects.  Given the patient's significant decline in performance status/poor tolerance to radiation therapy-I think patient is extremely high risk of side effects.  I recommend best supportive care/hospice.  See below  #Right hip pain/bone mets L3-s/p radiation worse;  increase Dex to 2mg  BID.  Would recommend discontinuation of Zometa [see below]  #Had a long discussion the patient and son-regarding the overall poor prognosis; given the risk of significant side effects from systemic therapy/expected decrease in the quality of life-hospice is reasonable.  We will make a hospice referral.  Discussed with [facility].   # DISPOSITION: # hospice referral asap dx; lung cancer # # follow up to be decided- Dr.B  Cc; Dr.Crsytal.

## 2019-08-13 NOTE — Progress Notes (Signed)
English NOTE  Patient Care Team: Leone Haven, MD as PCP - General (Family Medicine) Caryl Bis Angela Adam, MD as Consulting Physician (Family Medicine) Christene Lye, MD (General Surgery) Telford Nab, RN as Oncology Nurse Navigator Clent Jacks, RN as Oncology Nurse Navigator  CHIEF COMPLAINTS/PURPOSE OF CONSULTATION: lung cancer   Oncology History Overview Note  # MARCH 2021-CT scan [incidental-hospital admission for COPD exacerbation] PET scan-2.5 cm left lingular lesion; hilar adenopathy; 5.2 cm; 2.5 cm-hypermetabolic masses in the liver/cirrhotic; L3 right pedicle lesion; PET scan-left upper lobe nodule; mediastinal hilar adenopathy; liver lesion; bone lesions.  July 11, 2019-MRI brain multiple brain lesions.  April 6-liver biopsy- SMALL CELL lung cancer.   #COPD [Dr. Gonzalez]/smoker  # NGS/MOLECULAR TESTS:    # PALLIATIVE CARE EVALUATION: Josh; 4/07.   # PAIN MANAGEMENT:    DIAGNOSIS: Small cell lung cancer  STAGE:IV         ;  GOALS: Palliative  CURRENT/MOST RECENT THERAPY :     Cancer of upper lobe of left lung ([place])  07/17/2019 Initial Diagnosis   Cancer of upper lobe of left lung ([facility])      HISTORY OF PRESENTING ILLNESS:  Jane Davis 86 y.o.  female metastatic/extensive stage small cell lung cancer is here for follow-up.  Patient is currently status post radiation to the brain 2 days ago.  As per the family patient noted to have episodes of memory lapses.  Unsteady gait.  Risk of falls.  Complains of pain in the right hip.  Patient is currently taking dexamethasone 2 mg every other day/tapering as per radiation oncology.  In general patient is noted to be weak.  Poor appetite.  Positive for weight loss.  Review of Systems  Constitutional: Positive for malaise/fatigue and weight loss. Negative for chills, diaphoresis and fever.  HENT: Negative for nosebleeds and sore throat.   Eyes: Negative for  double vision.  Respiratory: Positive for cough, sputum production and shortness of breath. Negative for wheezing.   Cardiovascular: Negative for chest pain, palpitations, orthopnea and leg swelling.  Gastrointestinal: Negative for abdominal pain, blood in stool, constipation, diarrhea, heartburn, melena, nausea and vomiting.  Genitourinary: Negative for dysuria, frequency and urgency.  Musculoskeletal: Positive for joint pain. Negative for back pain.  Skin: Negative.  Negative for itching and rash.  Neurological: Negative for dizziness, tingling, focal weakness, weakness and headaches.  Endo/Heme/Allergies: Does not bruise/bleed easily.  Psychiatric/Behavioral: Negative for depression. The patient is not nervous/anxious and does not have insomnia.      MEDICAL HISTORY:  Past Medical History:  Diagnosis Date  . COPD (chronic obstructive pulmonary disease) ([place])   . Essential hypertension 03/22/2016  . Glaucoma   . Hypertension   . Hypothyroidism   . Hypoxia 02/11/2016   Overview:  Discharged from hospital on home oxygen.  . Palpitations 03/22/2016  . Prediabetes 02/19/2015  . Rectal lesion 05/29/2016  . Urge incontinence of urine 05/29/2016    SURGICAL HISTORY: Past Surgical History:  Procedure Laterality Date  . ABDOMINAL HYSTERECTOMY    . COLONOSCOPY  2013   [place]     SOCIAL HISTORY: Social History   Socioeconomic History  . Marital status: Widowed    Spouse name: Not on file  . Number of children: Not on file  . Years of education: Not on file  . Highest education level: Not on file  Occupational History  . Not on file  Tobacco Use  . Smoking status: Former Smoker  Packs/day: 1.00    Years: 40.00    Pack years: 40.00    Types: Cigarettes    Quit date: 01/11/2016    Years since quitting: 3.5  . Smokeless tobacco: Never Used  Substance and Sexual Activity  . Alcohol use: No  . Drug use: No  . Sexual activity: Never  Other Topics Concern  . Not on file   Social History Narrative   Lives with son; in [place]. Hx of smoking [quit 3 years ago]; no heavy alcohol; home maker.    Social Determinants of Health   Financial Resource Strain:   . Difficulty of Paying Living Expenses:   Food Insecurity:   . Worried About Charity fundraiser in the Last Year:   . Arboriculturist in the Last Year:   Transportation Needs:   . Film/video editor (Medical):   Marland Kitchen Lack of Transportation (Non-Medical):   Physical Activity:   . Days of Exercise per Week:   . Minutes of Exercise per Session:   Stress:   . Feeling of Stress :   Social Connections:   . Frequency of Communication with Friends and Family:   . Frequency of Social Gatherings with Friends and Family:   . Attends Religious Services:   . Active Member of Clubs or Organizations:   . Attends Archivist Meetings:   Marland Kitchen Marital Status:   Intimate Partner Violence:   . Fear of Current or Ex-Partner:   . Emotionally Abused:   Marland Kitchen Physically Abused:   . Sexually Abused:     FAMILY HISTORY: Family History  Problem Relation Age of Onset  . Intracerebral hemorrhage Mother   . Cancer Sister        breast  . Cancer Brother        colon; liver  . Bladder Cancer Neg Hx   . Prostate cancer Neg Hx     ALLERGIES:  has No Known Allergies.  MEDICATIONS:  Current Outpatient Medications  Medication Sig Dispense Refill  . albuterol (VENTOLIN HFA) 108 (90 Base) MCG/ACT inhaler Inhale 2 puffs into the lungs every 6 (six) hours as needed for wheezing or shortness of breath. 8 g 0  . aspirin EC 81 MG tablet Take 81 mg by mouth daily.    Marland Kitchen dexamethasone (DECADRON) 2 MG tablet Take 1 tablet (2 mg total) by mouth 2 (two) times daily. 30 tablet 0  . HYDROcodone-acetaminophen (NORCO/VICODIN) 5-325 MG tablet Take 1 tablet by mouth every 6 (six) hours as needed for severe pain. 90 tablet 0  . latanoprost (XALATAN) 0.005 % ophthalmic solution Place 1 drop into both eyes at bedtime.     Marland Kitchen  levothyroxine (SYNTHROID) 75 MCG tablet Take 1 tablet (75 mcg total) by mouth daily. 90 tablet 1  . olmesartan (BENICAR) 20 MG tablet Take 1 tablet (20 mg total) by mouth daily. 90 tablet 1  . ondansetron (ZOFRAN) 8 MG tablet One pill every 8 hours as needed for nausea/vomitting. 40 tablet 1  . prochlorperazine (COMPAZINE) 10 MG tablet Take 1 tablet (10 mg total) by mouth every 6 (six) hours as needed for nausea or vomiting. 40 tablet 1  . Timolol Maleate 0.5 % (DAILY) SOLN Place 1 drop into the right eye daily.     No current facility-administered medications for this visit.      Marland Kitchen  PHYSICAL EXAMINATION: ECOG PERFORMANCE STATUS: 1 - Symptomatic but completely ambulatory  Vitals:   08/13/19 1027  BP: 114/62  Pulse: 79  Temp: (!) 95.1 F (35.1 C)   Filed Weights   08/13/19 1027  Weight: 108 lb (49 kg)    Physical Exam  Constitutional: She is oriented to person, place, and time.  Accompanied by her son.  She is in a wheelchair.  Frail-appearing.  HENT:  Head: Normocephalic and atraumatic.  Mouth/Throat: Oropharynx is clear and moist. No oropharyngeal exudate.  Eyes: Pupils are equal, round, and reactive to light.  Cardiovascular: Normal rate and regular rhythm.  Pulmonary/Chest: No respiratory distress. She has no wheezes.  Decreased air entry bilaterally.  No wheeze or crackles.  Abdominal: Soft. Bowel sounds are normal. She exhibits no distension and no mass. There is no abdominal tenderness. There is no rebound and no guarding.  Musculoskeletal:        General: No tenderness or edema. Normal range of motion.     Cervical back: Normal range of motion and neck supple.  Neurological: She is alert and oriented to person, place, and time.  Skin: Skin is warm.  Psychiatric: Affect normal.     LABORATORY DATA:  I have reviewed the data as listed Lab Results  Component Value Date   WBC 5.0 08/13/2019   HGB 14.8 08/13/2019   HCT 45.8 08/13/2019   MCV 96.0 08/13/2019    PLT 149 (L) 08/13/2019   Recent Labs    06/24/19 1723 06/25/19 0422 07/07/19 1027 07/30/19 0957 08/13/19 0954  NA 139   < > 138 135 134*  K 4.5   < > 3.8 4.1 4.3  CL 99   < > 96* 93* 97*  CO2 33*   < > 33* 31 29  GLUCOSE 114*   < > 117* 100* 110*  BUN 11   < > 13 24* 20  CREATININE 0.45   < > 0.57 0.62 0.55  CALCIUM 9.5   < > 9.8 9.0 8.2*  GFRNONAA >60   < > >60 >60 >60  GFRAA >60   < > >60 >60 >60  PROT 7.8  --  8.2*  --  5.9*  ALBUMIN 4.4  --  4.5  --  3.3*  AST 23  --  22  --  18  ALT 13  --  13  --  19  ALKPHOS 83  --  82  --  47  BILITOT 0.6  --  0.7  --  0.8   < > = values in this interval not displayed.    RADIOGRAPHIC STUDIES: I have personally reviewed the radiological images as listed and agreed with the findings in the report. No results found.  ASSESSMENT & PLAN:   Cancer of upper lobe of left lung ([place]) # Small cell lung cancer-stage IV extensive stage-liver/bone; s/p liver biopsy; MRI brain-multiple brain lesions.  March 2021-PET scan shows a large liver lesion bone lesions; left lung nodule.  #Patient currently status post whole brain radiation for brain metastases-tolerating poorly-memory deficits/fatigue-see below].  Discussed the role of systemic therapy-chemoimmunotherapy.  However patient declined systemic therapy as she is concerned about the potential side effects.  Given the patient's significant decline in performance status/poor tolerance to radiation therapy-I think patient is extremely high risk of side effects.  I recommend best supportive care/hospice.  See below  #Right hip pain/bone mets L3-s/p radiation worse;  increase Dex to 2mg  BID.  Would recommend discontinuation of Zometa [see below]  #Had a long discussion the patient and son-regarding the overall poor prognosis; given the risk of significant side effects from systemic therapy/expected  decrease in the quality of life-hospice is reasonable.  We will make a hospice referral.  D[facility]ed  with [facility].   # DISPOSITION: # hospice referral asap dx; lung cancer # # follow up to be decided- Dr.B  Cc; Dr.Crsytal.  All questions were answered. The patient knows to call the clinic with any problems, questions or concerns.    Cammie Sickle, MD 08/13/2019 7:54 PM

## 2019-08-27 ENCOUNTER — Encounter: Payer: Medicare HMO | Admitting: Hospice and Palliative Medicine

## 2019-09-04 ENCOUNTER — Other Ambulatory Visit: Payer: Self-pay | Admitting: Hospice and Palliative Medicine

## 2019-09-04 MED ORDER — DEXAMETHASONE 2 MG PO TABS: 2.0000 mg | ORAL_TABLET | Freq: Two times a day (BID) | ORAL | 0 refills | Status: AC

## 2019-09-04 MED ORDER — MORPHINE SULFATE (CONCENTRATE) 10 MG /0.5 ML PO SOLN: 10.0000 mg | ORAL | 0 refills | Status: AC | PRN

## 2019-09-04 NOTE — Progress Notes (Signed)
I spoke with patient's hospice nurse.  Patient's declining.  She is no longer using the Norco but was using the morphine elixir from the comfort kit.  We will send a refill Rx for morphine elixir.

## 2019-09-09 DIAGNOSIS — J9601 Acute respiratory failure with hypoxia: Secondary | ICD-10-CM | POA: Diagnosis not present

## 2019-09-09 DIAGNOSIS — J449 Chronic obstructive pulmonary disease, unspecified: Secondary | ICD-10-CM | POA: Diagnosis not present

## 2019-09-11 ENCOUNTER — Ambulatory Visit: Payer: Medicare HMO | Admitting: Radiation Oncology

## 2019-10-09 DEATH — deceased

## 2020-05-08 IMAGING — MR MR HEAD WO/W CM
14 series · 45 of 48 positions shown · IV contrast (gadavist)
Comparison: None.

CLINICAL DATA: New diagnosis lung mass.  Staging.

EXAM:
MRI HEAD WITHOUT AND WITH CONTRAST
TECHNIQUE: Multiplanar, multiecho pulse sequences of the brain and surrounding
structures were obtained without and with intravenous contrast.
CONTRAST:  5mL GADAVIST GADOBUTROL 1 MMOL/ML IV SOLN

[Series 5: ax dwi_tracew · axial · 3.0mm · 0.60mm/px · z∈[-103,+52]mm · 4 of 48 slices shown]
[im 1/48]
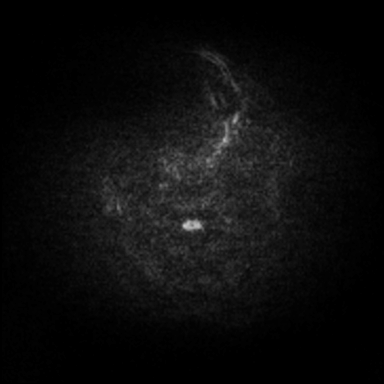
[im 16/48]
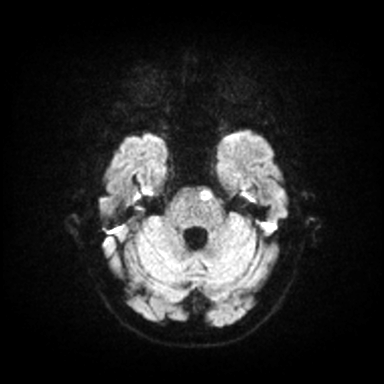
[im 32/48]
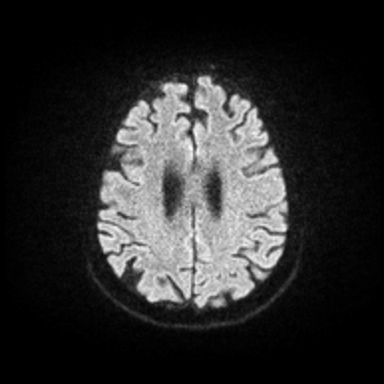
[im 48/48]
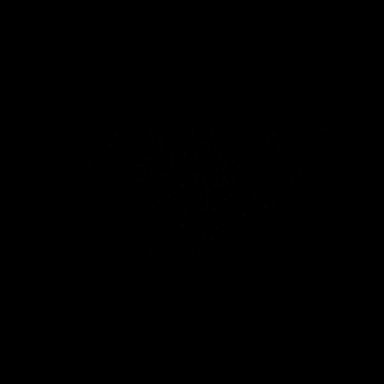

[Series 6: ax dwi_adc · axial · 3.0mm · 0.60mm/px · z∈[-103,+42]mm · 3 of 45 slices shown]
[im 1/45]
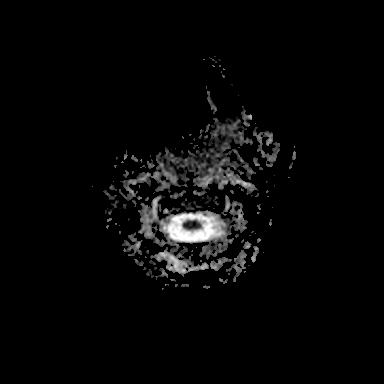
[im 23/45]
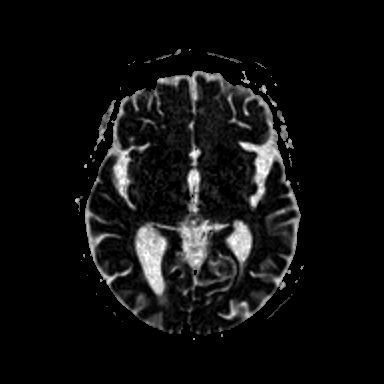
[im 45/45]
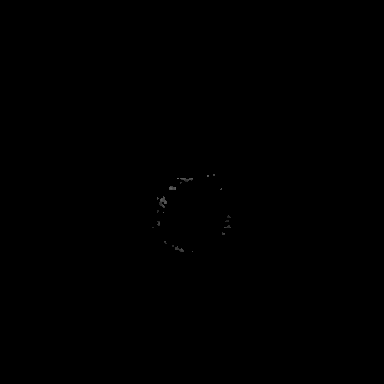

[Series 7: cor dwi_tracew · coronal · 5.0mm · 0.60mm/px · 2 of 38 slices shown]
[im 1/38]
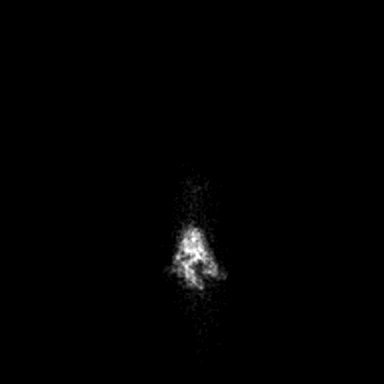
[im 38/38]
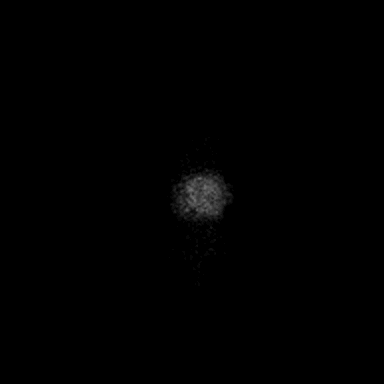

[Series 8: cor dwi_adc · coronal · 5.0mm · 0.60mm/px · 2 of 36 slices shown]
[im 1/36]
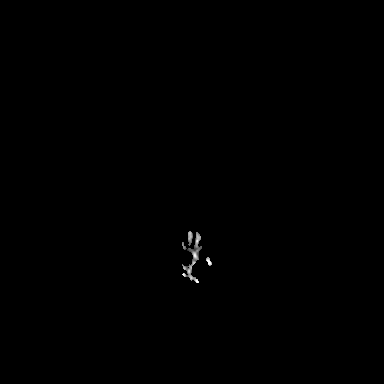
[im 36/36]
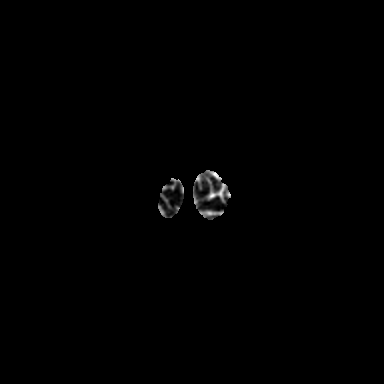

[Series 9: T1 · sagittal · 5.0mm · 0.62mm/px · 1 of 21 slices shown (1 of 2)]
[im 1/21]
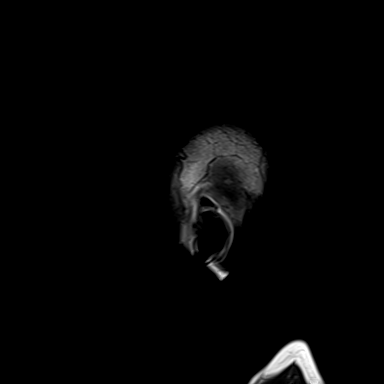

[Series 10: T2 · axial · 5.0mm · 0.53mm/px · 1 of 25 slices shown]
[im 1/25]
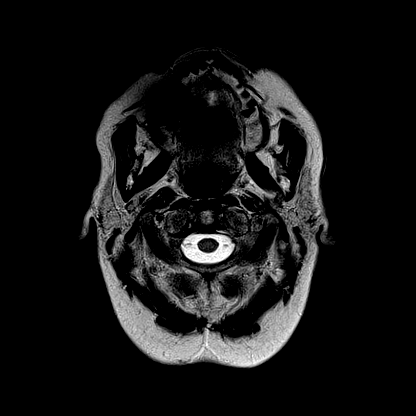

[Series 12: pha_images · axial · 3.0mm · 0.90mm/px · z∈[-112,+65]mm · 3 of 56 slices shown]
[im 1/56]
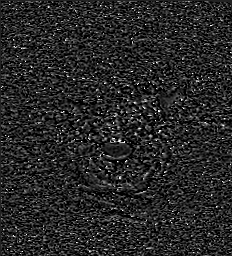
[im 28/56]
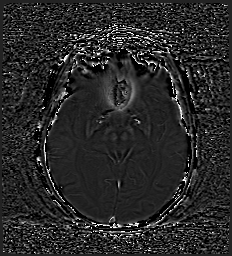
[im 56/56]
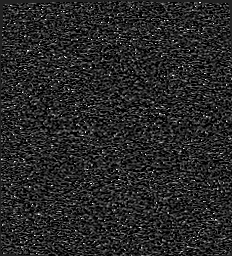

[Series 13: swi_images · axial · 3.0mm · 0.90mm/px · z∈[-112,+65]mm · 4 of 60 slices shown]
[im 1/60]
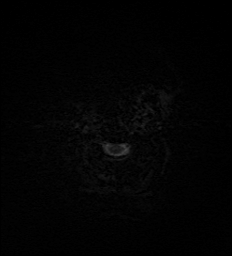
[im 20/60]
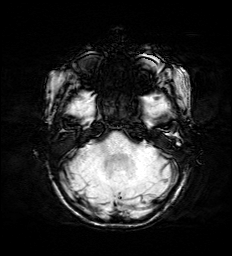
[im 40/60]
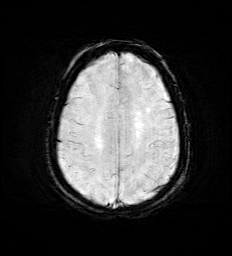
[im 60/60]
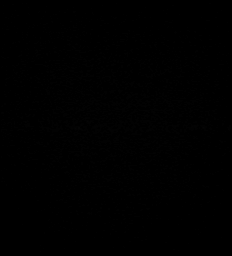

[Series 15: FLAIR · axial · 3.0mm · 0.53mm/px · z∈[-104,+57]mm · 3 of 55 slices shown]
[im 1/55]
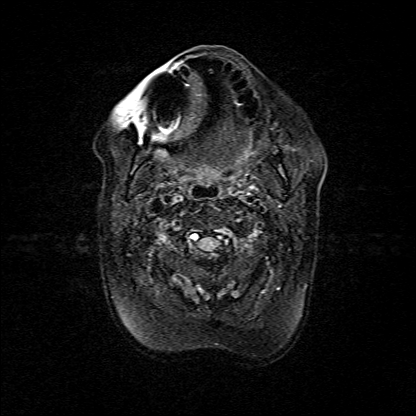
[im 28/55]
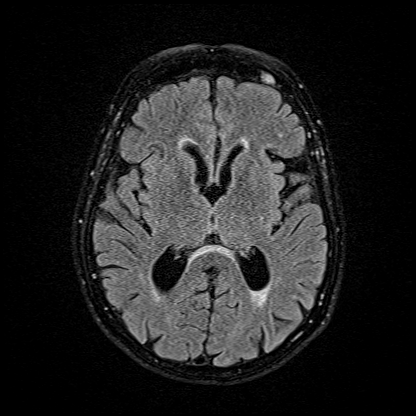
[im 55/55]
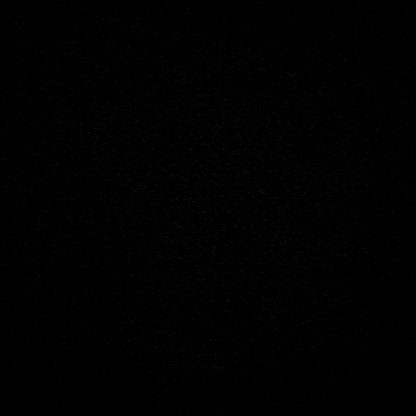

[Series 16: T1 · axial · 1.0mm · 0.98mm/px · z∈[-111,+61]mm · 8 of 173 slices shown (2 of 2)]
[im 1/173]
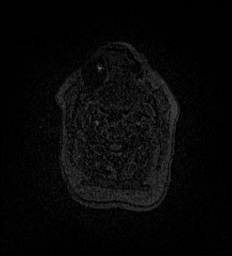
[im 20/173]
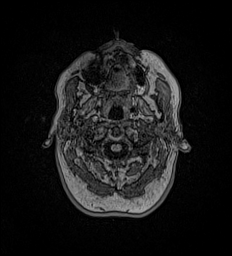
[im 58/173]
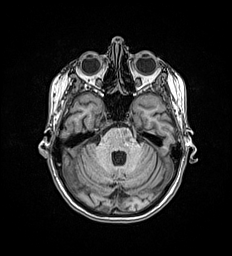
[im 77/173]
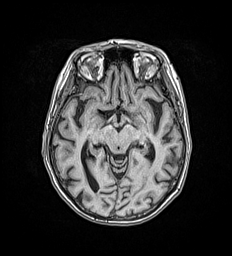
[im 96/173]
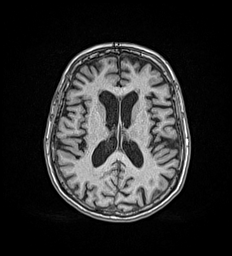
[im 115/173]
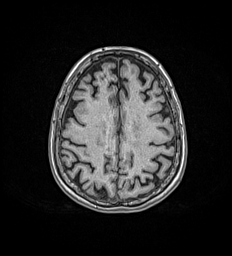
[im 153/173]
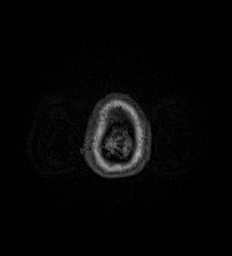
[im 173/173]
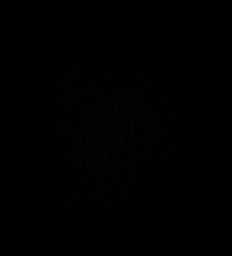

[Series 17: T2 post-contrast · coronal · 5.0mm · 0.57mm/px · 2 of 29 slices shown]
[im 1/29]
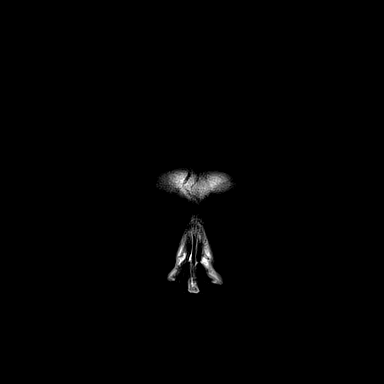
[im 29/29]
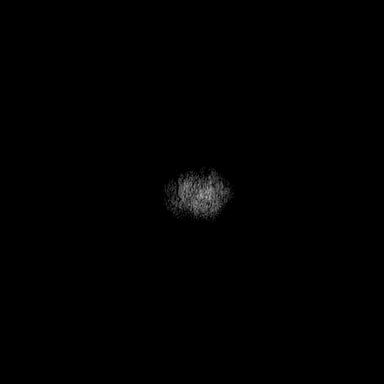

[Series 18: T1 post-contrast · axial · 1.0mm · 0.98mm/px · z∈[-111,+61]mm · 9 of 170 slices shown (1 of 3)]
[im 1/170]
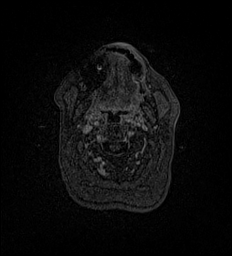
[im 19/170]
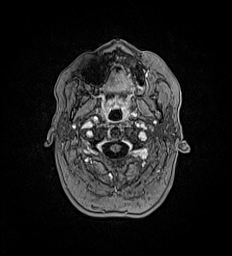
[im 38/170]
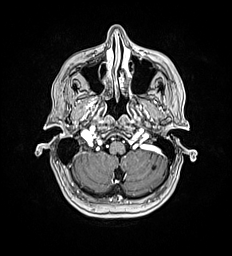
[im 57/170]
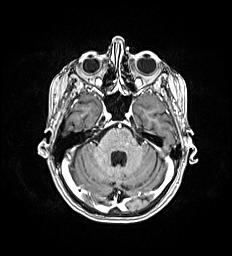
[im 76/170]
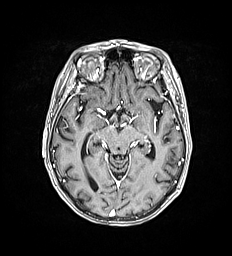
[im 94/170]
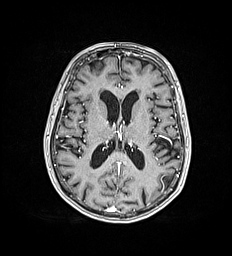
[im 113/170]
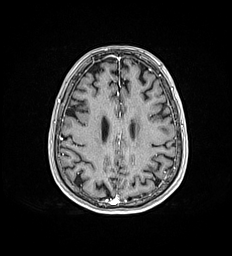
[im 151/170]
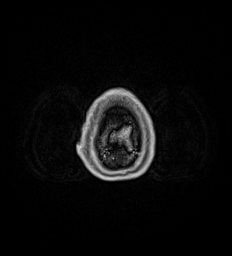
[im 170/170]
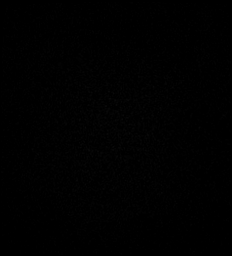

[Series 19: T1 post-contrast · coronal · 5.0mm · 0.57mm/px · 2 of 29 slices shown (2 of 3)]
[im 1/29]
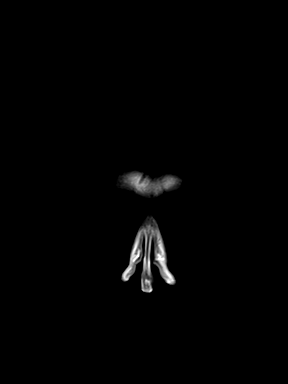
[im 29/29]
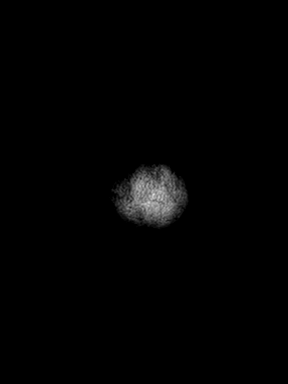

[Series 20: T1 post-contrast · sagittal · 5.0mm · 0.62mm/px · 1 of 21 slices shown (3 of 3)]
[im 1/21]
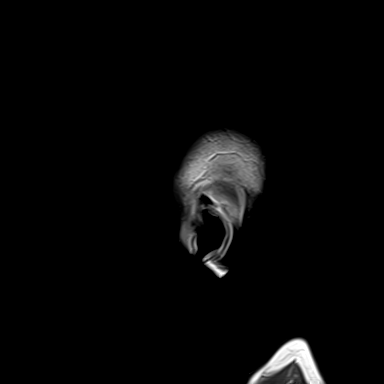

[45 of 48 positions shown; findings below may reference images not displayed]

FINDINGS: Brain: There are multiple intracranial metastatic lesions. The
majority of these show poor contrast enhancement in are actually
better seen on the diffusion imaging as foci of restricted
diffusion. Lesions are identified using a combination of axial and
coronal diffusion imaging as well as the postcontrast imaging.
Lesions are as follows.

2 mm metastasis inferior medial right cerebellum.

6-7 mm metastasis left ventral pons.

Question of a punctate metastasis at the cerebellar vermian tip.

2 mm metastasis in the right anterior thalamus.

2 mm metastasis in the left posterior limb internal capsule.

3 mm metastasis at the medial right frontal lobe.

5 mm metastasis of the right parietal cortex.

7 mm metastasis at the right frontal cortex at the vertex.

3 mm metastasis of the left frontal cortex at the vertex.

5 mm metastasis of the inferior temporal lobe on the right.

4 mm metastasis lateral surface of the left temporal lobe.

Otherwise, there is an old cerebellar infarction on the left.
Cerebral hemispheres show mild to moderate chronic small-vessel
changes of the white matter considering age. No hydrocephalus. No
extra-axial collection.

4 mm metastasis of the inferior frontal lobe on the left.

Vascular: Major vessels at the base of the brain show flow.

Skull and upper cervical spine: No bone metastases identified.

Sinuses/Orbits: Clear/normal

Other: None
IMPRESSION: At least 11 metastases scattered throughout the infratentorial and
supratentorial brain as outlined above. Lesions show poor or no
enhancement and are actually better seen on the diffusion imaging,
therefore more difficult than usual to localize and documented,
requiring utilization of both the axial and coronal diffusion
imaging as well as the postcontrast T1 imaging. None of the lesions
is large or associated with mass effect, hemorrhage or vasogenic
edema.

## 2020-06-22 ENCOUNTER — Ambulatory Visit: Payer: Medicare HMO
